# Patient Record
Sex: Male | Born: 1935 | Race: White | Hispanic: No | Marital: Married | Smoking: Current every day smoker
Health system: Southern US, Community
[De-identification: ages and names within clinical notes are randomized; demographics above are authoritative.]

## PROBLEM LIST (undated history)

## (undated) DIAGNOSIS — I5022 Chronic systolic (congestive) heart failure: Secondary | ICD-10-CM

## (undated) DIAGNOSIS — R7989 Other specified abnormal findings of blood chemistry: Secondary | ICD-10-CM

## (undated) DIAGNOSIS — N400 Enlarged prostate without lower urinary tract symptoms: Secondary | ICD-10-CM

## (undated) DIAGNOSIS — D649 Anemia, unspecified: Secondary | ICD-10-CM

## (undated) DIAGNOSIS — T7840XA Allergy, unspecified, initial encounter: Secondary | ICD-10-CM

## (undated) DIAGNOSIS — K5792 Diverticulitis of intestine, part unspecified, without perforation or abscess without bleeding: Secondary | ICD-10-CM

## (undated) DIAGNOSIS — K5790 Diverticulosis of intestine, part unspecified, without perforation or abscess without bleeding: Secondary | ICD-10-CM

## (undated) DIAGNOSIS — K219 Gastro-esophageal reflux disease without esophagitis: Secondary | ICD-10-CM

## (undated) DIAGNOSIS — M47816 Spondylosis without myelopathy or radiculopathy, lumbar region: Secondary | ICD-10-CM

## (undated) DIAGNOSIS — F419 Anxiety disorder, unspecified: Secondary | ICD-10-CM

## (undated) DIAGNOSIS — I4891 Unspecified atrial fibrillation: Secondary | ICD-10-CM

## (undated) DIAGNOSIS — K7460 Unspecified cirrhosis of liver: Secondary | ICD-10-CM

## (undated) DIAGNOSIS — M1990 Unspecified osteoarthritis, unspecified site: Secondary | ICD-10-CM

## (undated) DIAGNOSIS — K559 Vascular disorder of intestine, unspecified: Secondary | ICD-10-CM

## (undated) DIAGNOSIS — R001 Bradycardia, unspecified: Secondary | ICD-10-CM

## (undated) DIAGNOSIS — B029 Zoster without complications: Secondary | ICD-10-CM

## (undated) DIAGNOSIS — K859 Acute pancreatitis without necrosis or infection, unspecified: Secondary | ICD-10-CM

## (undated) DIAGNOSIS — I255 Ischemic cardiomyopathy: Secondary | ICD-10-CM

## (undated) DIAGNOSIS — E785 Hyperlipidemia, unspecified: Secondary | ICD-10-CM

## (undated) DIAGNOSIS — IMO0002 Reserved for concepts with insufficient information to code with codable children: Secondary | ICD-10-CM

## (undated) DIAGNOSIS — K838 Other specified diseases of biliary tract: Secondary | ICD-10-CM

## (undated) DIAGNOSIS — F329 Major depressive disorder, single episode, unspecified: Secondary | ICD-10-CM

## (undated) DIAGNOSIS — R945 Abnormal results of liver function studies: Secondary | ICD-10-CM

## (undated) DIAGNOSIS — I2510 Atherosclerotic heart disease of native coronary artery without angina pectoris: Secondary | ICD-10-CM

## (undated) DIAGNOSIS — I219 Acute myocardial infarction, unspecified: Secondary | ICD-10-CM

## (undated) DIAGNOSIS — I10 Essential (primary) hypertension: Secondary | ICD-10-CM

## (undated) DIAGNOSIS — F32A Depression, unspecified: Secondary | ICD-10-CM

## (undated) DIAGNOSIS — E875 Hyperkalemia: Secondary | ICD-10-CM

## (undated) DIAGNOSIS — I8510 Secondary esophageal varices without bleeding: Secondary | ICD-10-CM

## (undated) HISTORY — DX: Other specified diseases of biliary tract: K83.8

## (undated) HISTORY — DX: Chronic systolic (congestive) heart failure: I50.22

## (undated) HISTORY — PX: SHOULDER OPEN ROTATOR CUFF REPAIR: SHX2407

## (undated) HISTORY — PX: CATARACT EXTRACTION W/ INTRAOCULAR LENS IMPLANT: SHX1309

## (undated) HISTORY — DX: Benign prostatic hyperplasia without lower urinary tract symptoms: N40.0

## (undated) HISTORY — DX: Hyperkalemia: E87.5

## (undated) HISTORY — DX: Other specified abnormal findings of blood chemistry: R79.89

## (undated) HISTORY — PX: ESOPHAGOGASTRODUODENOSCOPY: SHX1529

## (undated) HISTORY — PX: CORONARY ANGIOPLASTY WITH STENT PLACEMENT: SHX49

## (undated) HISTORY — DX: Atherosclerotic heart disease of native coronary artery without angina pectoris: I25.10

## (undated) HISTORY — DX: Unspecified cirrhosis of liver: K74.60

## (undated) HISTORY — PX: FLEXIBLE SIGMOIDOSCOPY: SHX1649

## (undated) HISTORY — DX: Unspecified osteoarthritis, unspecified site: M19.90

## (undated) HISTORY — DX: Vascular disorder of intestine, unspecified: K55.9

## (undated) HISTORY — PX: LUMBAR DISC SURGERY: SHX700

## (undated) HISTORY — DX: Zoster without complications: B02.9

## (undated) HISTORY — DX: Bradycardia, unspecified: R00.1

## (undated) HISTORY — DX: Essential (primary) hypertension: I10

## (undated) HISTORY — DX: Ischemic cardiomyopathy: I25.5

## (undated) HISTORY — DX: Allergy, unspecified, initial encounter: T78.40XA

## (undated) HISTORY — DX: Abnormal results of liver function studies: R94.5

## (undated) HISTORY — DX: Diverticulitis of intestine, part unspecified, without perforation or abscess without bleeding: K57.92

## (undated) HISTORY — DX: Diverticulosis of intestine, part unspecified, without perforation or abscess without bleeding: K57.90

## (undated) HISTORY — DX: Anemia, unspecified: D64.9

## (undated) HISTORY — DX: Reserved for concepts with insufficient information to code with codable children: IMO0002

## (undated) HISTORY — DX: Hyperlipidemia, unspecified: E78.5

## (undated) HISTORY — DX: Unspecified atrial fibrillation: I48.91

## (undated) HISTORY — PX: COLONOSCOPY: SHX174

## (undated) HISTORY — DX: Spondylosis without myelopathy or radiculopathy, lumbar region: M47.816

## (undated) HISTORY — PX: BACK SURGERY: SHX140

## (undated) HISTORY — PX: COLON RESECTION: SHX5231

## (undated) HISTORY — DX: Gastro-esophageal reflux disease without esophagitis: K21.9

## (undated) HISTORY — PX: TONSILLECTOMY: SUR1361

## (undated) HISTORY — DX: Acute myocardial infarction, unspecified: I21.9

## (undated) HISTORY — DX: Acute pancreatitis without necrosis or infection, unspecified: K85.90

---

## 1997-11-06 HISTORY — PX: ANGIOPLASTY: SHX39

## 1998-04-27 ENCOUNTER — Other Ambulatory Visit: Admission: RE | Admit: 1998-04-27 | Discharge: 1998-04-27 | Payer: Self-pay | Admitting: Cardiology

## 2000-07-27 ENCOUNTER — Inpatient Hospital Stay (HOSPITAL_COMMUNITY): Admission: RE | Admit: 2000-07-27 | Discharge: 2000-07-28 | Payer: Self-pay | Admitting: Specialist

## 2002-03-18 ENCOUNTER — Ambulatory Visit (HOSPITAL_COMMUNITY): Admission: RE | Admit: 2002-03-18 | Discharge: 2002-03-18 | Payer: Self-pay | Admitting: Cardiology

## 2003-02-04 ENCOUNTER — Encounter: Payer: Self-pay | Admitting: Specialist

## 2003-02-12 ENCOUNTER — Observation Stay (HOSPITAL_COMMUNITY): Admission: RE | Admit: 2003-02-12 | Discharge: 2003-02-13 | Payer: Self-pay | Admitting: Specialist

## 2003-11-07 HISTORY — PX: CORONARY ARTERY BYPASS GRAFT: SHX141

## 2003-12-02 ENCOUNTER — Encounter: Admission: RE | Admit: 2003-12-02 | Discharge: 2003-12-02 | Payer: Self-pay | Admitting: Cardiology

## 2003-12-03 ENCOUNTER — Encounter: Admission: RE | Admit: 2003-12-03 | Discharge: 2003-12-03 | Payer: Self-pay | Admitting: Cardiology

## 2003-12-28 ENCOUNTER — Ambulatory Visit (HOSPITAL_COMMUNITY): Admission: RE | Admit: 2003-12-28 | Discharge: 2003-12-29 | Payer: Self-pay | Admitting: Cardiology

## 2004-01-04 ENCOUNTER — Ambulatory Visit (HOSPITAL_COMMUNITY): Admission: RE | Admit: 2004-01-04 | Discharge: 2004-01-04 | Payer: Self-pay | Admitting: Surgery

## 2004-01-11 ENCOUNTER — Inpatient Hospital Stay (HOSPITAL_COMMUNITY): Admission: RE | Admit: 2004-01-11 | Discharge: 2004-01-15 | Payer: Self-pay | Admitting: Surgery

## 2004-02-22 ENCOUNTER — Encounter: Admission: RE | Admit: 2004-02-22 | Discharge: 2004-05-22 | Payer: Self-pay | Admitting: Cardiology

## 2004-05-23 ENCOUNTER — Encounter (HOSPITAL_COMMUNITY): Admission: RE | Admit: 2004-05-23 | Discharge: 2004-08-21 | Payer: Self-pay | Admitting: Cardiology

## 2004-08-10 ENCOUNTER — Inpatient Hospital Stay (HOSPITAL_COMMUNITY): Admission: EM | Admit: 2004-08-10 | Discharge: 2004-08-12 | Payer: Self-pay | Admitting: *Deleted

## 2004-09-07 ENCOUNTER — Ambulatory Visit: Payer: Self-pay | Admitting: Internal Medicine

## 2004-09-18 ENCOUNTER — Emergency Department (HOSPITAL_COMMUNITY): Admission: EM | Admit: 2004-09-18 | Discharge: 2004-09-18 | Payer: Self-pay | Admitting: Family Medicine

## 2004-10-20 ENCOUNTER — Ambulatory Visit: Payer: Self-pay | Admitting: Internal Medicine

## 2004-10-26 ENCOUNTER — Ambulatory Visit (HOSPITAL_COMMUNITY): Admission: RE | Admit: 2004-10-26 | Discharge: 2004-10-26 | Payer: Self-pay | Admitting: Internal Medicine

## 2004-11-02 ENCOUNTER — Ambulatory Visit: Payer: Self-pay | Admitting: Internal Medicine

## 2004-11-09 ENCOUNTER — Ambulatory Visit: Payer: Self-pay | Admitting: Internal Medicine

## 2005-01-24 ENCOUNTER — Encounter (INDEPENDENT_AMBULATORY_CARE_PROVIDER_SITE_OTHER): Payer: Self-pay | Admitting: *Deleted

## 2005-01-24 ENCOUNTER — Inpatient Hospital Stay (HOSPITAL_COMMUNITY): Admission: RE | Admit: 2005-01-24 | Discharge: 2005-01-30 | Payer: Self-pay | Admitting: Surgery

## 2006-10-17 ENCOUNTER — Encounter: Admission: RE | Admit: 2006-10-17 | Discharge: 2006-10-17 | Payer: Self-pay | Admitting: Specialist

## 2007-10-17 ENCOUNTER — Ambulatory Visit (HOSPITAL_COMMUNITY): Admission: RE | Admit: 2007-10-17 | Discharge: 2007-10-18 | Payer: Self-pay | Admitting: Specialist

## 2009-10-04 ENCOUNTER — Encounter: Payer: Self-pay | Admitting: Internal Medicine

## 2009-10-08 ENCOUNTER — Encounter (INDEPENDENT_AMBULATORY_CARE_PROVIDER_SITE_OTHER): Payer: Self-pay | Admitting: *Deleted

## 2009-10-08 ENCOUNTER — Encounter: Admission: RE | Admit: 2009-10-08 | Discharge: 2009-10-08 | Payer: Self-pay | Admitting: Internal Medicine

## 2009-10-14 ENCOUNTER — Encounter (INDEPENDENT_AMBULATORY_CARE_PROVIDER_SITE_OTHER): Payer: Self-pay | Admitting: *Deleted

## 2009-10-25 ENCOUNTER — Encounter: Payer: Self-pay | Admitting: Internal Medicine

## 2009-11-13 HISTORY — PX: ERCP: SHX60

## 2009-11-17 ENCOUNTER — Ambulatory Visit: Payer: Self-pay | Admitting: Internal Medicine

## 2009-11-17 DIAGNOSIS — R932 Abnormal findings on diagnostic imaging of liver and biliary tract: Secondary | ICD-10-CM

## 2009-11-17 LAB — CONVERTED CEMR LAB
AST: 149 units/L — ABNORMAL HIGH (ref 0–37)
Alkaline Phosphatase: 152 units/L — ABNORMAL HIGH (ref 39–117)
Basophils Absolute: 0.1 10*3/uL (ref 0.0–0.1)
Eosinophils Absolute: 0.2 10*3/uL (ref 0.0–0.7)
Lymphocytes Relative: 21.1 % (ref 12.0–46.0)
MCHC: 33 g/dL (ref 30.0–36.0)
Monocytes Relative: 8.3 % (ref 3.0–12.0)
Platelets: 134 10*3/uL — ABNORMAL LOW (ref 150.0–400.0)
RDW: 13.6 % (ref 11.5–14.6)
Total Bilirubin: 0.8 mg/dL (ref 0.3–1.2)

## 2009-11-23 ENCOUNTER — Encounter: Payer: Self-pay | Admitting: Internal Medicine

## 2009-11-23 ENCOUNTER — Ambulatory Visit (HOSPITAL_COMMUNITY): Admission: RE | Admit: 2009-11-23 | Discharge: 2009-11-23 | Payer: Self-pay | Admitting: Internal Medicine

## 2009-11-23 ENCOUNTER — Telehealth: Payer: Self-pay | Admitting: Gastroenterology

## 2009-11-23 ENCOUNTER — Inpatient Hospital Stay (HOSPITAL_COMMUNITY): Admission: EM | Admit: 2009-11-23 | Discharge: 2009-11-29 | Payer: Self-pay | Admitting: Emergency Medicine

## 2009-11-24 ENCOUNTER — Ambulatory Visit: Payer: Self-pay | Admitting: Gastroenterology

## 2009-11-26 ENCOUNTER — Encounter: Payer: Self-pay | Admitting: Gastroenterology

## 2009-11-27 ENCOUNTER — Encounter: Payer: Self-pay | Admitting: Gastroenterology

## 2009-11-30 ENCOUNTER — Telehealth (INDEPENDENT_AMBULATORY_CARE_PROVIDER_SITE_OTHER): Payer: Self-pay

## 2009-12-01 ENCOUNTER — Encounter: Payer: Self-pay | Admitting: Internal Medicine

## 2009-12-01 ENCOUNTER — Telehealth (INDEPENDENT_AMBULATORY_CARE_PROVIDER_SITE_OTHER): Payer: Self-pay

## 2009-12-06 ENCOUNTER — Ambulatory Visit: Payer: Self-pay | Admitting: Internal Medicine

## 2009-12-07 ENCOUNTER — Ambulatory Visit (HOSPITAL_COMMUNITY): Admission: RE | Admit: 2009-12-07 | Discharge: 2009-12-07 | Payer: Self-pay | Admitting: Internal Medicine

## 2009-12-07 ENCOUNTER — Encounter: Payer: Self-pay | Admitting: Internal Medicine

## 2010-01-21 ENCOUNTER — Telehealth: Payer: Self-pay | Admitting: Internal Medicine

## 2010-02-03 ENCOUNTER — Telehealth: Payer: Self-pay | Admitting: Internal Medicine

## 2010-02-04 ENCOUNTER — Ambulatory Visit: Payer: Self-pay | Admitting: Internal Medicine

## 2010-02-07 LAB — CONVERTED CEMR LAB
ALT: 91 units/L — ABNORMAL HIGH (ref 0–53)
AST: 117 units/L — ABNORMAL HIGH (ref 0–37)
Albumin: 3.7 g/dL (ref 3.5–5.2)
Alkaline Phosphatase: 131 units/L — ABNORMAL HIGH (ref 39–117)
Total Bilirubin: 0.6 mg/dL (ref 0.3–1.2)

## 2010-02-24 ENCOUNTER — Ambulatory Visit: Payer: Self-pay | Admitting: Internal Medicine

## 2010-02-24 DIAGNOSIS — K859 Acute pancreatitis without necrosis or infection, unspecified: Secondary | ICD-10-CM | POA: Insufficient documentation

## 2010-06-03 ENCOUNTER — Ambulatory Visit: Payer: Self-pay | Admitting: Internal Medicine

## 2010-06-20 ENCOUNTER — Ambulatory Visit: Payer: Self-pay | Admitting: *Deleted

## 2010-08-10 LAB — CONVERTED CEMR LAB
ALT: 71 units/L — ABNORMAL HIGH (ref 0–53)
AST: 56 units/L — ABNORMAL HIGH (ref 0–37)
Albumin: 4 g/dL (ref 3.5–5.2)
Total Bilirubin: 0.6 mg/dL (ref 0.3–1.2)

## 2010-08-18 ENCOUNTER — Ambulatory Visit: Payer: Self-pay | Admitting: Internal Medicine

## 2010-08-18 LAB — CONVERTED CEMR LAB
ALT: 66 units/L — ABNORMAL HIGH (ref 0–53)
Bilirubin, Direct: 0.1 mg/dL (ref 0.0–0.3)
Total Bilirubin: 0.6 mg/dL (ref 0.3–1.2)

## 2010-08-19 ENCOUNTER — Telehealth: Payer: Self-pay | Admitting: Internal Medicine

## 2010-09-23 ENCOUNTER — Ambulatory Visit: Payer: Self-pay | Admitting: Internal Medicine

## 2010-09-23 DIAGNOSIS — R109 Unspecified abdominal pain: Secondary | ICD-10-CM

## 2010-09-25 LAB — CONVERTED CEMR LAB
Albumin: 4 g/dL (ref 3.5–5.2)
Alkaline Phosphatase: 112 units/L (ref 39–117)
BUN: 11 mg/dL (ref 6–23)
CO2: 29 meq/L (ref 19–32)
Calcium: 9.2 mg/dL (ref 8.4–10.5)
GFR calc non Af Amer: 97.13 mL/min (ref >60)
Glucose, Bld: 80 mg/dL (ref 70–99)
Potassium: 4.7 meq/L (ref 3.5–5.1)
Sodium: 141 meq/L (ref 135–145)
Total Protein: 7 g/dL (ref 6.0–8.3)

## 2010-09-28 ENCOUNTER — Ambulatory Visit (HOSPITAL_COMMUNITY)
Admission: RE | Admit: 2010-09-28 | Discharge: 2010-09-28 | Payer: Self-pay | Source: Home / Self Care | Admitting: Internal Medicine

## 2010-10-06 ENCOUNTER — Telehealth: Payer: Self-pay | Admitting: Internal Medicine

## 2010-10-18 ENCOUNTER — Encounter: Payer: Self-pay | Admitting: Internal Medicine

## 2010-10-18 ENCOUNTER — Ambulatory Visit: Payer: Self-pay | Admitting: Cardiology

## 2010-12-06 NOTE — Procedures (Signed)
Summary: Colonoscopy: Diverticulosis   Colonoscopy  Procedure date:  09/07/2004  Findings:      Results: Diverticulosis.       Location:  [facility].   Patient Name: Kirk [name], Kirk [name] MRN:  Procedure Procedures: Colonoscopy CPT: [phone].  Personnel: Endoscopist: Iva Boop, MD, [facility].  Referred By: Pearletha Furl Jacky Kindle, MD.  Exam Location: Exam performed in Outpatient Clinic. Outpatient  Patient Consent: Procedure, Alternatives, Risks and Benefits discussed, consent obtained, from patient. Consent was obtained by the RN.  Indications Symptoms: Hematochezia. Change in bowel habits.  Comments: Recent ischemic colitis (clinically resolved). History  Current Medications: Patient is not currently taking Coumadin.  Pre-Exam Physical: Performed Sep 07, 2004. Cardio-pulmonary exam, Rectal exam, HEENT exam , Abdominal exam, Mental status exam WNL.  Exam Exam: Extent of exam reached: Cecum, extent intended: Cecum.  The cecum was identified by appendiceal orifice and IC valve. Colon retroflexion performed. Images taken. ASA Classification: II. Tolerance: good.  Monitoring: Pulse and BP monitoring, Oximetry used. Supplemental O2 given.  Colon Prep Used MiraLax for colon prep. Prep results: good.  Sedation Meds: Patient assessed and found to be appropriate for moderate (conscious) sedation. Fentanyl 75 mcg. given IV. Versed 6 mg. given IV.  Findings - DIVERTICULOSIS: Descending Colon to Sigmoid Colon. ICD9: Diverticulosis, Colon: 562.10. Comments: Stenotic, edematous distal sigmoid. No inflammation seen.  - NORMAL EXAM: Cecum to Descending Colon.  NORMAL EXAM: Rectum.   Assessment Abnormal examination, see findings above.  Diagnoses: 562.10: Diverticulosis, Colon.   Comments: Severe diverticulosis. I think he is having diverticular pain, though could still be recovering from ischemia. No divertriculitis seen. Events  Unplanned Interventions: No  intervention was required.  Plans Comments: Still using some narcotics. Will try Levbid. Disposition: After procedure patient sent to recovery. After recovery patient sent home.  Scheduling/Referral: Clinic Visit, to Iva Boop, MD, [facility], 6 weeks,   Comments: If symptoms worsen would check CT.   CC:   Geoffry Paradise, MD  This report was created from the original endoscopy report, which was reviewed and signed by the above listed endoscopist.     Appended Document: Colonoscopy: Diverticulosis  Colonoscopy  Procedure date:  09/07/2004  Findings:      Results: Diverticulosis. Location:  [facility].   Comments: Severe diverticulosis. I think he is having diverticular pain, though could still be recovering from ischemia. No divertriculitis seen.

## 2010-12-06 NOTE — Procedures (Signed)
Summary: ERCP  Patient: Kendle Erker Note: All result statuses are Final unless otherwise noted.  Tests: (1) ERCP (ERC)   ERC ERCP                  DONE     [facility]     [address] [place], [place]  [address]           ERCP PROCEDURE REPORT           PATIENT:  Kirk [name], Kirk [name]  MR#:  [id number]     BIRTHDATE:  [birth year]  GENDER:  male           ENDOSCOPIST:  Barbette Hair. Arlyce Dice, MD     ASSISTANT:           PROCEDURE DATE:  11/27/2009     PROCEDURE:  ERCP with stent placement           INDICATIONS:  cholangitis           MEDICATIONS:   MAC sedation, administered by CRNA     TOPICAL ANESTHETIC:           DESCRIPTION OF PROCEDURE:   After the risks benefits and     alternatives of the procedure were thoroughly explained, informed     consent was obtained.  The  endoscope was introduced through the     mouth and advanced to the third portion of the duodenum.           other finding (see image1). Very prominent (bulging) ampulla with     normal appearing mucosa  The pancreatic duct was successfully     cannulated and filled with contrast. Care was taken not to     overfill the duct. Normal appearing pancreatic duct. Because of     recent h/o p-ERCP pancreatitis, a 5Fr 4cm pancreatic stent was     placed.  incomplete exam. CBD in the perampullary area was     patulous. Unable to pass a wire or catheter into more proximal CBD     for successful cannulation.    The scope was then completely     withdrawn from the patient and the procedure terminated.     <<PROCEDUREIMAGES>>           COMPLICATIONS:  None           ENDOSCOPIC IMPRESSION:     1) Other finding - prominent ampulla     2) Normal  pancreatic duct - s/p stent insertion b/c of h/o     pancreatitis     3) Incomplete exam - unsuccessful cannulation of CBD beyond     perampullary area           Pt has jaundice and probable low grade cholangitis.  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 Arlyce Dice, MD           CC:  Iva Boop, M.D., [facility]           n.     eSIGNED:   Barbette Hair. Oneta Sigman at 11/27/2009 01:04 PM  Ruperto, Kiernan, [phone]  Note: An exclamation mark (!) indicates a result that was not dispersed into the flowsheet. Document Creation Date: 11/27/2009 1:05 PM _______________________________________________________________________  (1) Order result status: Final Collection or observation date-time: 11/27/2009 12:09 Requested date-time:  Receipt date-time:  Reported date-time:  Referring Physician:   Ordering Physician: Melvia Heaps [phone]) Specimen Source:  Source: Launa Grill Order Number: [id number] Lab site:   Appended Document: ERCP Pt. has a f/u appt. with Dr.Gessner on 12-06-09. Follow-up will be discussed/scheduled at that time.  Appended Document: ERCP   ERCP  Procedure date:  11/27/2009  Findings:      abnormal: Location: [facility] ENDOSCOPIC IMPRESSION:     1) Other finding - prominent ampulla     2) Normal  pancreatic duct - s/p stent insertion b/c of h/o     pancreatitis     3) Incomplete exam - unsuccessful cannulation of CBD beyond     perampullary area

## 2010-12-06 NOTE — Progress Notes (Signed)
Summary: MRCP results explained, 4 month follow-up planned  Phone Note Outgoing Call   Summary of Call: I explained MRCP results: no mass or tumor seen bile duct still dilated, pancreatic duct also - stable he did have another attack of abdominal pain and it was relieved by hyoscyamine. He understands there could be a malignant or benign problem with ampulla but we have decided to monitor and see how he does as not really a candidate for a surgery and lack of mass over 1 year is a good sign. If persistent pain issues would pursue EUS, I think. Iva Boop MD, [facility]  October 06, 2010 6:27 PM

## 2010-12-06 NOTE — Progress Notes (Signed)
  Phone Note Call from Patient   Caller: Spouse Summary of Call: has had nausea, abd pain, chills since ERCP this afternoon (reviewed ERCP note, PD never cannulated, no sphincterotomy).  I'm having him go to [facility] ER, will meet him there after getting basic labs done (pancreatic enzyms, cbc, cmet) Initial call taken by: Rachael Fee MD,  November 23, 2009 6:02 PM

## 2010-12-06 NOTE — Letter (Signed)
Summary: [facility]   Imported By: Sherian Rein 11/22/2009 09:47:28  _____________________________________________________________________  External Attachment:    Type:   Image     Comment:   External Document

## 2010-12-06 NOTE — Letter (Signed)
Summary: [facility]   Imported By: Sherian Rein 12/22/2009 07:44:57  _____________________________________________________________________  External Attachment:    Type:   Image     Comment:   External Document

## 2010-12-06 NOTE — Assessment & Plan Note (Signed)
Summary: post hospital/sheri   History of Present Illness Visit Type: Follow-up Visit Primary GI MD: Stan Head MD [facility] Primary Provider: Geoffry Paradise, MD Requesting Provider: Geoffry Paradise, MD Chief Complaint: dilated bile duct and abnormal LFT's History of Present Illness:   77 yo white man with dilated bile duct and abnormal LFT's. He had this evaluated with ERCP and it was unrevealing, though he had atypivcal cells on biliary brushings.No masses on CT. He developed post-ERCP pancreatitis and was hospitalized, then developed CHF problems also. His main issue now is chronic recurrent back pain. there had been some subsequent ? of abdominal pain and chills and ? of cholangitis. Another ERCP was attempted by Dr. Arlyce Dice but was unsuccessful. He denies any abdominal pain and says he has not really been having any. He appears to be recovered fro the pancreatitis.   GI Review of Systems      Denies abdominal pain, acid reflux, belching, bloating, chest pain, dysphagia with liquids, dysphagia with solids, heartburn, loss of appetite, nausea, vomiting, vomiting blood, weight loss, and  weight gain.        Denies anal fissure, black tarry stools, change in bowel habit, constipation, diarrhea, diverticulosis, fecal incontinence, heme positive stool, hemorrhoids, irritable bowel syndrome, jaundice, light color stool, liver problems, rectal bleeding, and  rectal pain.    Current Medications (verified): 1)  Diclofenac Sodium Cr 100 Mg Xr24h-Tab (Diclofenac Sodium) .... As Needed 2)  Finasteride 5 Mg Tabs (Finasteride) .... Take 1 Tablet By Mouth Once A Day 3)  Hydrocodone-Acetaminophen 5-500 Mg Tabs (Hydrocodone-Acetaminophen) .... As Needed For Pain 4)  Lisinopril 10 Mg Tabs (Lisinopril) .... Take 1 Tablet By Mouth Once A Day 5)  Protonix 40 Mg  Tbec (Pantoprazole Sodium) .Marland Kitchen.. 1 Each Day 30 Minutes Before Meal As Needed 6)  Aspirin 81 Mg Tbec (Aspirin) .... Take 1 Tablet By Mouth Once A  Day 7)  Claritin 10 Mg Tabs (Loratadine) .... As Needed  Allergies (verified): 1)  ! * Mycins  Past History:  Past Medical History: Myocardial Infarction 1999 Hyperlipidemia GERD Coronary Artery Disease Hypertension Arthritis Diverticulitis Diverticulosis dilated bile duct, uncklear cause post-ERCP pancreatitis  Past Surgical History: Colon Resection Back Surgery CABGx4 Angioplasty 1999 Rotator Cuff Repair Cataract Extraction  Family History: Reviewed history from 11/17/2009 and no changes required. Family History of Lung Cancer: Father Family History of Diabetes: Mother Family History of Heart Disease: Mother, CHF No FH of Colon Cancer:  Social History: Reviewed history from 11/17/2009 and no changes required. Alcohol Use - yes Occupation: Retired Patient currently smokes. Cigars Daily Caffeine Use Illicit Drug Use - no  Review of Systems       The patient complains of allergy/sinus, arthritis/joint pain, back pain, cough, itching, sleeping problems, and urination - excessive.  The patient denies anemia, anxiety-new, blood in urine, breast changes/lumps, change in vision, confusion, coughing up blood, depression-new, fainting, fatigue, fever, headaches-new, hearing problems, heart murmur, heart rhythm changes, menstrual pain, muscle pains/cramps, night sweats, nosebleeds, pregnancy symptoms, shortness of breath, skin rash, sore throat, swelling of feet/legs, swollen lymph glands, thirst - excessive , urination - excessive , urination changes/pain, urine leakage, vision changes, and voice change.    Vital Signs:  Patient profile:   77 year old male Height:      66 inches Weight:      140.25 pounds BMI:     22.72 Pulse rate:   68 / minute Pulse rhythm:   regular BP sitting:   160 / 80  (left  arm) Cuff size:   regular  Vitals Entered By: June McMurray CMA Duncan Dull) (February 24, 2010 2:47 PM)  Physical Exam  General:  Well developed, well nourished, no acute  distress.   Impression & Recommendations:  Problem # 1:  ALKALINE PHOSPHATASE, ELEVATED (ICD-790.5) Assessment Improved presumably related to biliary dilation ? component of CHF/passive congestion  Problem # 2:  TRANSAMINASES, SERUM, ELEVATED (ICD-790.4) Assessment: Improved presumably related to biliary dilation ? component of CHF/passive congestion  Problem # 3:  ABNORMAL EXAM-BILIARY TRACT (ICD-793.3) Assessment: Unchanged Cause of dilated bile duct is unclear. Though there were atypical cells on biliary brushings he had no masses or strictures. i do not think that he has had cholangitis, I think his LFT's increased after ERCP due to pancreatitis and edema worsening biliary obstruction. this has improved and LFT's nearly normal, CHF may have also contributed to jaundice and abnormal LFT's. at this point he understands we are not sure of cause of this but balance of data indicated its benign and he seems to be asymptomatic. Plan to o bserve and follow-up in a few months. Consider repeat imaging (M, EUS)) then.  Problem # 4:  Hx of PANCREATITIS, ACUTE (ICD-577.0) Assessment: Improved post-ercp pancreatitis appears resolved.  Patient Instructions: 1)  Please schedule a follow-up appointment in 4 months.  2)  Please schedule a follow-up appointment as needed. Sooner if you get pains or develop jaundice (yellow eyes, skin). 3)  Copy sent to : Geoffry Paradise, MD, Roger Shelter, MD 4)  The medication list was reviewed and reconciled.  All changed / newly prescribed medications were explained.  A complete medication list was provided to the patient / caregiver. Patient: Kirk [name] Note: All result statuses are Final unless otherwise noted.  Tests: (1) Hepatic/Liver Function Panel (HEPATIC)   Total Bilirubin           0.6 mg/dL                   5.1-8.8   Direct Bilirubin          0.2 mg/dL                   4.1-6.6   Alkaline Phosphatase [H]  131 U/L                     39-117    AST                  [H]  117 U/L                     0-37   ALT                  [H]  91 U/L                      0-53   Total Protein             6.8 g/dL                    0.6-3.0   Albumin                   3.7 g/dL                    1.6-0.1  Note: An exclamation mark (!) indicates a result that was not dispersed into the flowsheet. Document Creation Date: 02/04/2010 10:52 AM

## 2010-12-06 NOTE — Progress Notes (Signed)
  Phone Note Outgoing Call   Summary of Call: notify patient liver chemistries stable I would like to see him in follow-up to discuss possible repeat imaging Iva Boop MD, [facility]  August 19, 2010 8:30 AM      Appended Document:  pt's wife notified labs are stable and of need for REV.  She will relay this message to pt and leave it up to him to decide if he wants to come in for visit and repeat imaging.

## 2010-12-06 NOTE — Progress Notes (Signed)
Summary: Reschedule KUB  ---- Converted from flag ---- ---- 12/01/2009 10:38 AM, Iva Boop MD, [facility] wrote: I would do it on Friday and he does not need to be NPO (give it a little longer to pass)  ---- 12/01/2009 9:54 AM, Darcey Nora RN, CGRN wrote: Amy  had me set this up.  He has a REV with you on MOnday.  Let me know when I need to reschedule   Ascher Schroepfer ---- 12/01/2009 9:19 AM, Iva Boop MD, [facility] wrote: May want to change date of KUB talk to me ------------------------------  Phone Note Outgoing Call Call back at [facility] Phone [phone]   Call placed by: Darcey Nora RN, CGRN,  December 01, 2009 10:45 AM Call placed to: Patient Summary of Call: Patient  rescheduled to Friday 12-03-09 8:30 Initial call taken by: Darcey Nora RN, CGRN,  December 01, 2009 10:46 AM

## 2010-12-06 NOTE — Progress Notes (Signed)
Summary: follow-up  Phone Note Outgoing Call   Summary of Call: Please call Kirk [name] He needs repeat LFT's and then will arrange for a follow-up REV ask ihim about any GI sxs and let me know Iva Boop MD, [facility]  January 21, 2010 9:28 AM   Follow-up for Phone Call        Left message for patient to call back Kirk Nora RN, [facility]  January 21, 2010 10:02 AM  Patient states he has occasional lower abdominal pain that will last for a few hours and relieved by rest.  Otherwise he states he is doing well. REV scheduled for 02-24-10 , he will come next week for labs.    Follow-up by: Kirk Nora RN, CGRN,  January 21, 2010 12:06 PM

## 2010-12-06 NOTE — Assessment & Plan Note (Signed)
Summary: 6 month follow up pancreatitis/sheri   History of Present Illness Visit Type: Follow-up Visit Primary GI MD: Stan Head MD [facility] Primary Provider: Geoffry Paradise, MD Requesting Provider: na Chief Complaint: pancreatitis History of Present Illness:   77 yo wm with  dilated bile ducts and abnormal LFT's. ERCP was inconclusive and he developed post-ERCP pancreatitis in early 2011. He recovered from that.  No complaints today. He had cataract surgery on left since last visit. No other doctor visits since last visit here.  He recently had his drivers license including CDL's renewed.    GI Review of Systems      Denies abdominal pain, acid reflux, belching, bloating, chest pain, dysphagia with liquids, dysphagia with solids, heartburn, loss of appetite, nausea, vomiting, vomiting blood, weight loss, and  weight gain.        Denies anal fissure, black tarry stools, change in bowel habit, constipation, diarrhea, diverticulosis, fecal incontinence, heme positive stool, hemorrhoids, irritable bowel syndrome, jaundice, light color stool, liver problems, rectal bleeding, and  rectal pain. Clinical Reports Reviewed:  ERCP:  11/27/2009:  DONE  11/27/2009:  abnormal: Location: [facility]. ENDOSCOPIC IMPRESSION:     1) Other finding - prominent ampulla     2) Normal  pancreatic duct - s/p stent insertion b/c of h/o     pancreatitis     3) Incomplete exam - unsuccessful cannulation of CBD beyond     perampullary area  11/23/2009:  DONE  11/23/2009:  abnormal: Location: [facility]. ENDOSCOPIC IMPRESSION:     1) Dilatation in the total biliary tree of uncclear     significance. No obstruction identified.  ***Microscopic Examination and Diagnosis*** STATEMENT Of SPECIMEN ADEQUACY: Satisfactory for evaluation. INTERPRETATION(S): BILE DUCT BRUSHING, AMPULA: Atypical cells PRESENT, see comment. Comment: There are a few small groups of atypical glandular  epithelium with prominent nucleoli suspicious for malignancy.     Current Medications (verified): 1)  Diclofenac Sodium Cr 100 Mg Xr24h-Tab (Diclofenac Sodium) .... As Needed 2)  Finasteride 5 Mg Tabs (Finasteride) .... Take 1 Tablet By Mouth Once A Day 3)  Hydrocodone-Acetaminophen 5-500 Mg Tabs (Hydrocodone-Acetaminophen) .... As Needed For Pain 4)  Lisinopril 10 Mg Tabs (Lisinopril) .... Take 1 Tablet By Mouth Once A Day 5)  Protonix 40 Mg  Tbec (Pantoprazole Sodium) .Marland Kitchen.. 1 Each Day 30 Minutes Before Meal As Needed 6)  Aspirin 81 Mg Tbec (Aspirin) .... Take 1 Tablet By Mouth Once A Day 7)  Claritin 10 Mg Tabs (Loratadine) .... As Needed  Allergies (verified): 1)  ! * Mycins  Past History:  Past Medical History: Myocardial Infarction 1999 Hyperlipidemia GERD Coronary Artery Disease Hypertension Arthritis Diverticulitis Diverticulosis dilated bile duct, unclear cause post-ERCP pancreatitis  Past Surgical History: Reviewed history from 02/24/2010 and no changes required. Colon Resection Back Surgery CABGx4 Angioplasty 1999 Rotator Cuff Repair Cataract Extraction  Family History: Reviewed history from 11/17/2009 and no changes required. Family History of Lung Cancer: Father Family History of Diabetes: Mother Family History of Heart Disease: Mother, CHF No FH of Colon Cancer:  Social History: Reviewed history from 11/17/2009 and no changes required. Alcohol Use - yes Occupation: Retired Patient currently smokes. Cigars Daily Caffeine Use Illicit Drug Use - no  Vital Signs:  Patient profile:   76 year old male Height:      66 inches Weight:      141 pounds BMI:     22.84 BSA:     1.73 Pulse rate:   74 / minute  Pulse rhythm:   regular BP sitting:   142 / 86  (left arm) Cuff size:   regular  Vitals Entered By: Ok Anis CMA (June 03, 2010 2:59 PM)  Physical Exam  General:  Well developed, well nourished, no acute distress.   Impression &  Recommendations:  Problem # 1:  ABNORMAL EXAM-BILIARY TRACT (ICD-793.3) Assessment Unchanged ERCP as inconclusive and in fact looked to be without stricture. Atypoical cells on brushings.   Given the lack of clear stricture and problems from the ERCP, as well as his lack of sxs, plan to observe and assess with labs at this time. At some point will repeat imaging in 2011 most likely. He understands could have a cancer developing but at his age, etc surgery has major risks and it may never be a real clinical issue for him.  Orders: TLB-Hepatic/Liver Function Pnl ([id number])  Problem # 2:  ALKALINE PHOSPHATASE, ELEVATED (ICD-790.5) Assessment: Unchanged  Problem # 3:  TRANSAMINASES, SERUM, ELEVATED (ICD-790.4) Assessment: Unchanged  Patient Instructions: 1)  Please go to the basement to have your lab tests drawn today.  2)  We will call you with further follow up after reviewing these results. 3)  Copy sent to : Geoffry Paradise, MD 4)  The medication list was reviewed and reconciled.  All changed / newly prescribed medications were explained.  A complete medication list was provided to the patient / caregiver. Patient: Kirk [name] Note: All result statuses are Final unless otherwise noted.  Tests: (1) Hepatic/Liver Function Panel (HEPATIC)   Total Bilirubin           0.6 mg/dL                   0.3-4.7   Direct Bilirubin          0.2 mg/dL                   4.2-5.9   Alkaline Phosphatase [H]  147 U/L                     39-117   AST                  [H]  56 U/L                      0-37   ALT                  [H]  71 U/L                      0-53   Total Protein             7.1 g/dL                    5.6-3.8   Albumin                   4.0 g/dL                    7.5-6.4  Note: An exclamation mark (!) indicates a result that was not dispersed into the flowsheet. Document Creation Date: 06/03/2010 5:06 PM  These are about the same as in April. Not certain from biliary  dilation.  Will have him undergo repeat labs and  imaging with EUS vs MRCP in the Fall.   We will call him back in October.

## 2010-12-06 NOTE — Progress Notes (Signed)
Summary: Schedule KUB  Phone Note Outgoing Call Call back at [facility] Phone [phone]   Call placed by: Darcey Nora RN, CGRN,  November 30, 2009 10:20 AM Call placed to: Patient Summary of Call: Called patient to schedule KUB to visualize location on pancreatic stent.  Patient  is scheduled for 12-02-09 8:30.  Patient  is asked to be NPO. Initial call taken by: Darcey Nora RN, CGRN,  November 30, 2009 10:24 AM

## 2010-12-06 NOTE — Assessment & Plan Note (Signed)
Summary: ELEVATED LTF'S/YF   History of Present Illness Visit Type: consult Primary GI MD: Stan Head MD [facility] Primary Provider: Geoffry Paradise, MD Requesting Provider: Geoffry Paradise, MD Chief Complaint: elevated LFTs History of Present Illness:   77 year old white man sent over to evaluate dilated bile ducts and abnormal transaminases and alkaline phosphatase. He had been doing well and presented for a routine physical exam. He does describe some bloating at times and some occasional constipation. There is no abdominal pain. His primary care physician found in the have an AST of 125 on November 30, and ALT of 146, and an alkaline phosphatase of 232. Total bilirubin 0.4. CT scanning was undertaken demonstrating mild intrahepatic biliary dilation to moderate, with progressive dilation of the common bile duct at 11 mm in the pancreatic head on one view and 1.4 cm in the porta hepatis and 1.3 cm and the pancreatic head on the coronal views. The scan has been reviewed with the patient. Images seen.   GI Review of Systems    Reports abdominal pain and  bloating.      Denies acid reflux, belching, chest pain, dysphagia with liquids, dysphagia with solids, heartburn, loss of appetite, nausea, vomiting, vomiting blood, weight loss, and  weight gain.      Reports constipation and  liver problems.     Denies anal fissure, black tarry stools, change in bowel habit, diarrhea, diverticulosis, fecal incontinence, heme positive stool, hemorrhoids, irritable bowel syndrome, jaundice, light color stool, rectal bleeding, and  rectal pain.    Current Medications (verified): 1)  Diclofenac Sodium Cr 100 Mg Xr24h-Tab (Diclofenac Sodium) .... As Needed 2)  Finasteride 5 Mg Tabs (Finasteride) .... Take 1 Tablet By Mouth Once A Day 3)  Hydrocodone-Acetaminophen 5-500 Mg Tabs (Hydrocodone-Acetaminophen) .... As Needed For Pain 4)  Lisinopril 10 Mg Tabs (Lisinopril) .... Take 1 Tablet By Mouth Once A Day 5)   Protonix 40 Mg  Tbec (Pantoprazole Sodium) .Marland Kitchen.. 1 Each Day 30 Minutes Before Meal As Needed 6)  Aspirin 81 Mg Tbec (Aspirin) .... Take 1 Tablet By Mouth Once A Day  Allergies (verified): 1)  ! * Mycins  Past History:  Past Medical History: Myocardial Infarction 1999 Hyperlipidemia GERD Coronary Artery Disease Hypertension Arthritis Diverticulitis Diverticulosis  Past Surgical History: Reviewed history from 11/16/2009 and no changes required. Colon Resection Back Surgery CABGx4 Angioplasty 1999 Rotator Cuff Repair  Family History: Family History of Lung Cancer: Father Family History of Diabetes: Mother Family History of Heart Disease: Mother, CHF No FH of Colon Cancer:  Social History: Alcohol Use - yes Occupation: Retired Patient currently smokes. Cigars Daily Caffeine Use Illicit Drug Use - no Smoking Status:  current Drug Use:  no  Review of Systems       The patient complains of back pain, fatigue, hearing problems, muscle pains/cramps, shortness of breath, sleeping problems, and urination - excessive.         All other ROS negative except as per HPI.   Vital Signs:  Patient profile:   77 year old male Height:      66 inches Weight:      149.50 pounds BMI:     24.22 Pulse rate:   64 / minute Pulse rhythm:   regular BP sitting:   150 / 68  (left arm)  Vitals Entered By: Milford Cage NCMA (November 17, 2009 1:53 PM)  Physical Exam  General:  Well developed, well nourished, no acute distress. Eyes:  PERRLA, no icterus. Mouth:  No deformity  or lesions, dentition normal. Neck:  Supple; no masses or thyromegaly. Lungs:  Clear throughout to auscultation. Heart:  Regular rate and rhythm; no murmurs, rubs,  or bruits. Abdomen:  prior surgical scares some increase in bowel sounds soft and nontender without HSM/mass reducible right inguinal hernia Extremities:  varicosities, hyperpigmentation, no edema in lower extremities Neurologic:  Alert and   oriented x4; Cervical Nodes:  No significant cervical or supraclavicular adenopathy.  Psych:  Alert and cooperative. Normal mood and affect.   Impression & Recommendations:  Problem # 1:  TRANSAMINASES, SERUM, ELEVATED (ICD-790.4) Assessment New presumably due to obstruction detected on CT scan. We'll recheck labs today to see course. Orders: TLB-Hepatic/Liver Function Pnl ([id number])  Problem # 2:  ALKALINE PHOSPHATASE, ELEVATED (ICD-790.5) Assessment: New presumably due to the obstruction on CT as above.  Problem # 3:  ABNORMAL EXAM-BILIARY TRACT (ICD-793.3) Assessment: New there is dilation of the intra-and extrahepatic biliary tree. Maximal dilation up to 1.4 cm on CT scanning. There is no pancreatic mass fortunately. There could be an obstruction at the level of the papilla includes an ampullary tumor, or perhaps papillary stenosis or even cholangiocarcinoma. Bile duct stone or stones remains possible but seems unlikely. We'll plan for ERCP to investigate this. Risks, benefits,and indications of endoscopic procedure(s) were reviewed with the patient and all questions answered.  Problem # 4:  THROMBOCYTOPENIA (ICD-287.5) Assessment: New This is mild, given that ERCP and possible sphincterotomy contemplated will recheck a platelet count with a CBC. Orders: TLB-CBC Platelet - w/Differential ([id number])  Patient Instructions: 1)  Please go to the basement to have your lab tests drawn today.  2)  We will call you with your lab results and schedule ERCP. 3)  ERCP brochure given.  4)  Copy sent to : Geoffry Paradise, MD 5)  The medication list was reviewed and reconciled.  All changed / newly prescribed medications were explained.  A complete medication list was provided to the patient / caregiver.  Appended Document: ELEVATED LTF'S/YF let him know labs about the same, slightly better. He needs the ERCP, ? if it can be done next Tues PM antibiotics not needed  pre-ERCP  Appended Document: ELEVATED LTF'S/YF Patient  is scheduled for ERCP at [facility] 11-23-09 1:30.  I have left a message for the patient to call back to discuss.  Appended Document: ELEVATED LTF'S/YF I have confirmed the appointment with the patient's wife.  She verbalized understanding of instructions.

## 2010-12-06 NOTE — Procedures (Signed)
Summary: Flex Sig: Diverticulosis   Flexible Sigmoidoscopy  Procedure date:  11/09/2004  Findings:      Results: Diverticulosis.   Comments:      Location: [facility].  Patient Name: Kirk [name], Kirk [name] MRN:  Procedure Procedures: Flexible Proctosigmoidoscopy CPT: [phone].  Personnel: Endoscopist: Iva Boop, MD, [facility].  Exam Location: Exam performed in Outpatient Clinic. Outpatient  Patient Consent: Procedure, Alternatives, Risks and Benefits discussed, consent obtained, from patient.  Indications Symptoms: Abdominal pain / bloating.  Abnormal Exams, Studies: CT scan,  History  Current Medications: Patient is not currently taking Coumadin.  Comments: PERSISTENT LLQ PAIN AFTER ISCHEMIC COLITIS COLONOSCOPY 09/07/04 WITH EDEMATOUS SIGMOID. CT SHOWS PERSISTENT NARROWING OF SIGMOID. ALSO SHOWS CALCIFICATION OF SMA AND IMA. Pre-Exam Physical: Performed Nov 09, 2004. Cardio-pulmonary exam  WNL. Rectal exam, HEENT exam , Mental status exam WNL.  Exam Exam: Extent visualized: Sigmoid Colon. Extent of exam: 40 cm. Patient position: on left side. Colon retroflexion not performed. Images taken. ASA Classification: III. Tolerance: good.  Monitoring: Pulse and BP monitoring, Oximetry used. Supplemental O2 given.  Colon Prep Used Phospho Soda for colon prep. Dose Used: Pharmacist, community.  Sedation Meds: Patient assessed and found to be appropriate for moderate (conscious) sedation. Fentanyl 50 mcg. given IV. Versed 7 mg. given IV.  Findings DIVERTICULOSIS: Sigmoid Colon. ICD9: Diverticulosis, Colon: 562.10.  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  Constriction: partial. Comments: EDEMATOUS AND STENOTIC AREA 20-30 CM REGION, NO DISCRETE MASS, LOOKS BETTER THAN BEFORE.  - NORMAL EXAM: Rectum.   Assessment Abnormal examination, see findings above.  Diagnoses: 562.10: Diverticulosis, Colon.   Comments: STENOTIC DISTAL SIGMOID LOOKS BETTER. I DO  NOT SEE A TUMOR. SUSPECT RELATED TO PRIOR OR ONGOING ISCHEMIA VS. DIVERTICULITIS. WILL CONTINUE METRONIDAZOLE AND CIPRO FOR A TOTAL OF 2 WEEKS. Events  Unplanned Intervention: DECOMPRESSION, SEE BELOW.  Intervention Medications: Hyoscamine  Abdominal pain.  Intervention Results: The intervention was successful.  Plans Disposition: After procedure patient sent to recovery. After recovery patient sent home.  Comments: I THINK HE SHOULD SEE A VASCULAR SURGEON ABOUT ARTERIAL CALCIFICATIONS AND FURTHER EVALUATION VS GENERAL SURGEON OR BOTH. WILL DISCUSS. 1600 HRS: HE HAD ABDOMINAL DISTENTION  AND PAIN AFTER THE PROCEDURE SO I WENT BACK AND DECOMPRESSED THE BOWEL PROXIMAL TO THE STENOSIS WITH GOOD INITIAL RESULT. WILL MONITOR HIM AND RELEASE IF HE FEELS WELL ENOUGH. STAY ON LIQUIDS TONIGHT.   CC:   Geoffry Paradise, MD   Roger Shelter, MD  This report was created from the original endoscopy report, which was reviewed and signed by the above listed endoscopist.   Appended Document: Flex Sig: Diverticulosis Clinical Lists Changes  Observations: Added new observation of FLEX SIGMOID: Results: Diverticulosis. Comments: STENOTIC DISTAL SIGMOID LOOKS BETTER. I DO NOT SEE A TUMOR. SUSPECT RELATED TO PRIOR OR ONGOING ISCHEMIA VS. DIVERTICULITIS. WILL CONTINUE METRONIDAZOLE AND CIPRO FOR A TOTAL OF 2 WEEKS. (11/09/2004 13:39)   Flexible Sigmoidoscopy  Procedure date:  11/09/2004  Findings:      Results: Diverticulosis. Comments: STENOTIC DISTAL SIGMOID LOOKS BETTER. I DO NOT SEE A TUMOR. SUSPECT RELATED TO PRIOR OR ONGOING ISCHEMIA VS. DIVERTICULITIS. WILL CONTINUE METRONIDAZOLE AND CIPRO FOR A TOTAL OF 2 WEEKS.  Comments:      Location: [facility].

## 2010-12-06 NOTE — Consult Note (Signed)
Summary: [facility]  [facility]   Imported By: Lennie Odor 12/09/2009 11:35:16  _____________________________________________________________________  External Attachment:    Type:   Image     Comment:   External Document

## 2010-12-06 NOTE — Procedures (Signed)
Summary: ERCP  Patient: Kirk [name] Note: All result statuses are Final unless otherwise noted.  Tests: (1) ERCP (ERC)   ERC ERCP                  DONE (C)     [facility]     [address] [place], [place]  [address]           ERCP PROCEDURE REPORT           PATIENT:  Kirk [name], Kirk [name]  MR#:  [id number]     BIRTHDATE:  [birth year]  GENDER:  male           ENDOSCOPIST:  Iva Boop, MD, [facility]           PROCEDURE DATE:  11/23/2009     PROCEDURE:  ERCP           INDICATIONS:  abnormal imaging, abnormal Liver Function Tests     dilated extra and intrahepatics on CT           MEDICATIONS:  None  ADDENDUM-CORRECTION: Fentanyl 62.5ug IV and     Versed 6 mg IV given     TOPICAL ANESTHETIC:  Cetacaine Spray           DESCRIPTION OF PROCEDURE:   After the risks benefits and     alternatives of the procedure were thoroughly explained, informed     consent was obtained.  The  endoscope was introduced through the     mouth and advanced to the second portion of the duodenum. The     esophagus was not seen well due to side-viewing scope. The stomach     and duodenum did appear normal through the side-view scope.           The shortwire sphincterotome was easily inserted deeply into the     bile duct through a normal-appearing papilla. A dilatation was     found in the total biliary tree. Maximum diameter CBD 14 mm. No     stones or strictures seen. Contrast drained and bile was draining     prior to insertion of the sphincterotome. Balloon sweep negative     for stones. (Balloon deflated before pull-through of ampulla).     Ampulla and very distal CBD was brushed for cytology. As time went     on there was some prominence of the duodenal mucosa above the     ampulla but no mucosal abnormailities evident there or with     papilla.   The pancreas was not cannulated by intent. The scope     was then completely withdrawn from the patient and the procedure     terminated.    <<PROCEDUREIMAGES>>           COMPLICATIONS:  None           ENDOSCOPIC IMPRESSION:     1) Dilatation in the total biliary tree of uncclear     significance. No obstruction identified.     RECOMMENDATIONS:     await cytoloogy results - I will notify     may need serologic w/u of LFT's     ? EUS     he has no pain or symptoms so no sphincterotomy today           Iva Boop, MD, [facility]           CC:  Geoffry Paradise, M.D.     The Patient  n.     REVISED:  11/23/2009 02:31 PM     eSIGNED:   Iva Boop at 11/23/2009 02:31 PM           Angela Cox, [id number]  Note: An exclamation mark (!) indicates a result that was not dispersed into the flowsheet. Document Creation Date: 11/23/2009 2:30 PM _______________________________________________________________________  (1) Order result status: Final Collection or observation date-time: 11/23/2009 14:07 Requested date-time:  Receipt date-time:  Reported date-time:  Referring Physician:   Ordering Physician: Stan Head [id number]) Specimen Source:  Source: Launa Grill Order Number: [id number] Lab site:   Appended Document: ERCP   ERCP  Procedure date:  11/23/2009  Findings:      abnormal: Location: [facility]. ENDOSCOPIC IMPRESSION:     1) Dilatation in the total biliary tree of uncclear     significance. No obstruction identified.  ***Microscopic Examination and Diagnosis*** STATEMENT Of SPECIMEN ADEQUACY: Satisfactory for evaluation. INTERPRETATION(S): BILE DUCT BRUSHING, AMPULA: Atypical cells PRESENT, see comment. Comment: There are a few small groups of atypical glandular epithelium with prominent nucleoli suspicious for malignancy.

## 2010-12-06 NOTE — Procedures (Signed)
Summary: Upper Endoscopy  Patient: Kirk [name] Note: All result statuses are Final unless otherwise noted.  Tests: (1) Upper Endoscopy (EGD)   EGD Upper Endoscopy       DONE (C)     [facility]     [address] [place], [place]  [address]           ENDOSCOPY PROCEDURE REPORT           PATIENT:  [name], [name]  MR#:  [id number]     BIRTHDATE:  01-24-1927, 82 yrs. old  GENDER:  male           ENDOSCOPIST:  Iva Boop, MD, [facility]           PROCEDURE DATE:  12/07/2009     PROCEDURE:  EGD with foreign body removal     ASA CLASS:  Class II     INDICATIONS:  foreign body remove indwelling pancreatic stent           MEDICATIONS:   Fentanyl 50 mg IV, Versed 6 mg IV ADDENDUM: Cipro     400 mg IV given on call     TOPICAL ANESTHETIC:  Cetacaine Spray           DESCRIPTION OF PROCEDURE:   Prior to sedation and insertion of     duodenoscope, fluoroscopy was used to confirm that stent was still     in the pancreas.  After the risks benefits and alternatives of the     procedure were thoroughly explained, informed consent was     obtained.  The  duodenoscope was introduced through the mouth and     advanced to the second portion of the duodenum, without     limitations.  The instrument was slowly withdrawn as the mucosa     was fully examined.     <<PROCEDUREIMAGES>>           The esophagus was not well seen with the side-viewing     duodenoscope. The stomach was entered and  examined. The antrum,     angularis, and lesser curvature were visualized, including a     retroflexed view of the cardia and fundus. The stomach wall was     normally distensable. views with the side-viewing scope were     normal. The scope passed easily through the pylorus into the     duodenum.  The duodenal bulb was normal in appearance, as was the     postbulbar duodenum though there was a pancreatic stent protruding     from the ampulla (pigtail stent). It was grasped with a snare and  removed. Fluor spot images confirm that it was removed also.     Retroflexed views revealed no abnormalities.    The scope was then     withdrawn from the patient and the procedure completed.           COMPLICATIONS:  None           ENDOSCOPIC IMPRESSION:     1) Normal EGD with limited views of the esophagus (see above)     2) Successful removal of pancreatic stent.     RECOMMENDATIONS:     I will arrange office follow-up in the next few weeks to     reassess and determine/discuss further work-up of dilated bile     duct.           Clear liquids tonight. Resume prior diet tomorrow.  Iva Boop, MD, [facility]           CC:  Geoffry Paradise, MD     The Patient           n.     REVISED:  12/07/2009 02:35 PM     eSIGNED:   Iva Boop at 12/07/2009 02:35 PM           Angela Cox, [id number]  Note: An exclamation mark (!) indicates a result that was not dispersed into the flowsheet. Document Creation Date: 12/07/2009 2:35 PM _______________________________________________________________________  (1) Order result status: Final Collection or observation date-time: 12/07/2009 13:55 Requested date-time:  Receipt date-time:  Reported date-time:  Referring Physician:   Ordering Physician: Stan Head [id number]) Specimen Source:  Source: Launa Grill Order Number: [id number] Lab site:   Appended Document: Upper Endoscopy   EGD  Procedure date:  12/07/2009  Findings:      Location: [facility] ENDOSCOPIC IMPRESSION:     1) Normal EGD with limited views of the esophagus (see above)     2) Successful removal of pancreatic stent.

## 2010-12-06 NOTE — Progress Notes (Signed)
Summary: call to him re: LFT's  Phone Note Outgoing Call   Summary of Call: I called him re: labs LFT's not yet done he is ok will come tomorrow for hepatic function panel we need to set up order for tomorrow Iva Boop MD, [facility]  February 03, 2010 11:47 AM   Follow-up for Phone Call        Pt. notified the order is in the computer, please come by the lab anytime tomorrow. Pt. instructed to call back as needed.  Follow-up by: Laureen Ochs LPN,  February 03, 2010 11:58 AM

## 2010-12-06 NOTE — Assessment & Plan Note (Signed)
Summary: FOLLOW UP LIVER TESTS/SP   History of Present Illness Visit Type: Follow-up Visit Primary GI MD: Stan Head MD [facility] Primary Lendon George: Geoffry Paradise, MD Requesting Kurt Azimi: na Chief Complaint: Elevated Liver Function Test  History of Present Illness:   77 yo wm with a dilated bile duct and abnormal LFT's of unclear significance.  He is having episodic, sudden abdominal pain that is severe. He bends over for relief and then rests and it resolves spontaneously over a few hrs, he usually sleeps it off. It occure every few weeks. Cannot identify triggres. "I guess it is kind of an ongoing thing" - not necessarily new.  No change in bowels or bladder function. 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   GI Review of Systems    Reports abdominal pain.     Location of  Abdominal pain: generalized.    Denies acid reflux, belching, bloating, chest pain, dysphagia with liquids, dysphagia with solids, heartburn, loss of appetite, nausea, vomiting, vomiting blood, weight loss, and  weight gain.      Reports liver problems.     Denies anal fissure, black tarry stools, change in bowel habit, constipation, diarrhea, diverticulosis, fecal incontinence, heme positive stool, hemorrhoids, irritable bowel syndrome, jaundice, light color stool, rectal bleeding, and  rectal pain.    Current Medications (verified): 1)  Diclofenac Sodium Cr 100 Mg Xr24h-Tab (Diclofenac Sodium) .... As Needed 2)  Finasteride 5 Mg Tabs (Finasteride) .... Take 1 Tablet By Mouth Once A Day 3)  Hydrocodone-Acetaminophen 5-500 Mg Tabs (Hydrocodone-Acetaminophen) .... As Needed For Pain 4)  Lisinopril 10 Mg Tabs (Lisinopril) .... Take 1 Tablet By Mouth Once A Day 5)  Protonix 40 Mg  Tbec (Pantoprazole Sodium) .Marland Kitchen.. 1 Each Day 30 Minutes Before Meal As Needed 6)  Aspirin 81 Mg Tbec (Aspirin) .... Take 1 Tablet By Mouth Once A Day 7)  Claritin 10 Mg Tabs  (Loratadine) .... As Needed  Allergies (verified): 1)  ! * Mycins  Past History:  Past Medical History: Reviewed history from 06/03/2010 and no changes required. Myocardial Infarction 1999 Hyperlipidemia GERD Coronary Artery Disease Hypertension Arthritis Diverticulitis Diverticulosis dilated bile duct, unclear cause post-ERCP pancreatitis  Past Surgical History: Reviewed history from 02/24/2010 and no changes required. Colon Resection Back Surgery CABGx4 Angioplasty 1999 Rotator Cuff Repair Cataract Extraction  Family History: Reviewed history from 11/17/2009 and no changes required. Family History of Lung Cancer: Father Family History of Diabetes: Mother Family History of Heart Disease: Mother, CHF No FH of Colon Cancer:  Social History: Alcohol Use - yes Occupation: Retired Married Patient currently smokes. Cigars Daily Caffeine Use Illicit Drug Use - no  Vital Signs:  Patient profile:   77 year old male Height:      66 inches Weight:      146 pounds BMI:     23.65 BSA:     1.75 Pulse rate:   80 / minute Pulse rhythm:   regular BP sitting:   136 / 74  (left arm) Cuff size:   regular  Vitals Entered By: Ok Anis CMA (September 23, 2010 9:48 AM)  Physical Exam  General:  Well developed, well nourished, no acute distress. Eyes:   no icterus. Abdomen:  prior surgical scares soft and nontender without HSM/mass    Impression & Recommendations:  Problem # 1:  ABNORMAL EXAM-BILIARY TRACT (ICD-793.3) Assessment Unchanged dilatedbile duct, unclear cause cytology with atypical cells early 2011 MRCP will  be used to reassess now  even if he has an early cancer of bile duct, it seems relatively asymptomatic, if not completely and would not operate he had post-ercp pancreatitis and would need a clear indication to repeat ERCP - and we do not have at this point though initial ERCP was appropriate  Orders: TLB-CMP (Comprehensive Metabolic Pnl)  ([id number]) MRCP (MRCP)  Problem # 2:  ALKALINE PHOSPHATASE, ELEVATED (ICD-790.5) Assessment: Unchanged  Problem # 3:  TRANSAMINASES, SERUM, ELEVATED (ICD-790.4) Assessment: Unchanged  Problem # 4:  ABDOMINAL PAIN OTHER SPECIFIED SITE (ICD-789.09) Assessment: New sounds like it may have been a chronic intermittent issue does not sound biliary but suppose it could be  Patient Instructions: 1)  Copy sent to : Geoffry Paradise, MD 2)  Please pick up your medications at your pharmacy. 3)  Hyoscyamne was prescribed to take as needed for abdominal pain. 4)  Your physician has requested that you have the following labwork done today: Liver tests and electrolytes (CMET) 5)  Your MRCP is scheduled for Wednesday, 09/28/10 at [facility].  See seperate instructions. 6)  The medication list was reviewed and reconciled.  All changed / newly prescribed medications were explained.  A complete medication list was provided to the patient / caregiver. Prescriptions: HYOSCYAMINE SULFATE 0.125 MG SUBL (HYOSCYAMINE SULFATE) 1-2 dissolved under tongue every 4 hours as needed fr abdomnal pain  #30 x 1   Entered and Authorized by:   Iva Boop MD, [facility]   Signed by:   Iva Boop MD, FACG on 09/23/2010   Method used:   Electronically to        [facility]  [phone]* (retail)       [address] [place], [place]  [address]       Ph: [phone] or [phone]       Fax: [phone]   RxID:   [id number]

## 2010-12-08 NOTE — Letter (Signed)
Summary: [facility]  [facility]   Imported By: Lester Culloden 10/25/2010 12:03:44  _____________________________________________________________________  External Attachment:    Type:   Image     Comment:   External Document

## 2011-01-11 ENCOUNTER — Encounter: Payer: Self-pay | Admitting: Internal Medicine

## 2011-01-17 NOTE — Letter (Signed)
Summary: Office Visit Letter  [facility]  [address]   [place], [place] [address]   Phone: [phone]  Fax: [phone]      January 11, 2011 MRN: [id number]   [facility] [address] [place], [place]  [address]   Dear Mr. [facility],   According to our records, it is time for you to schedule a follow-up office visit with [place].   At your convenience, please call [phone] (option #2)to schedule an office visit. If you have any questions, concerns, or feel that this letter is in error, we would appreciate your call.   Sincerely,   Stan Head, M.D.   [facility] [phone]

## 2011-01-22 LAB — DIFFERENTIAL
Basophils Absolute: 0 10*3/uL (ref 0.0–0.1)
Basophils Absolute: 0 10*3/uL (ref 0.0–0.1)
Basophils Relative: 0 % (ref 0–1)
Basophils Relative: 0 % (ref 0–1)
Eosinophils Absolute: 0.1 10*3/uL (ref 0.0–0.7)
Eosinophils Relative: 1 % (ref 0–5)
Eosinophils Relative: 1 % (ref 0–5)
Lymphocytes Relative: 18 % (ref 12–46)
Lymphocytes Relative: 7 % — ABNORMAL LOW (ref 12–46)
Lymphs Abs: 0.6 10*3/uL — ABNORMAL LOW (ref 0.7–4.0)
Lymphs Abs: 1.3 10*3/uL (ref 0.7–4.0)
Monocytes Absolute: 0.3 10*3/uL (ref 0.1–1.0)
Monocytes Absolute: 0.8 10*3/uL (ref 0.1–1.0)
Monocytes Relative: 10 % (ref 3–12)
Monocytes Relative: 3 % (ref 3–12)
Neutro Abs: 13.2 10*3/uL — ABNORMAL HIGH (ref 1.7–7.7)
Neutro Abs: 9.5 10*3/uL — ABNORMAL HIGH (ref 1.7–7.7)
Neutrophils Relative %: 69 % (ref 43–77)
Neutrophils Relative %: 82 % — ABNORMAL HIGH (ref 43–77)
Neutrophils Relative %: 90 % — ABNORMAL HIGH (ref 43–77)

## 2011-01-22 LAB — COMPREHENSIVE METABOLIC PANEL
AST: 209 U/L — ABNORMAL HIGH (ref 0–37)
AST: 53 U/L — ABNORMAL HIGH (ref 0–37)
Albumin: 3.6 g/dL (ref 3.5–5.2)
Alkaline Phosphatase: 133 U/L — ABNORMAL HIGH (ref 39–117)
Alkaline Phosphatase: 176 U/L — ABNORMAL HIGH (ref 39–117)
BUN: 12 mg/dL (ref 6–23)
BUN: 22 mg/dL (ref 6–23)
CO2: 22 mEq/L (ref 19–32)
CO2: 23 mEq/L (ref 19–32)
CO2: 26 mEq/L (ref 19–32)
Calcium: 8.1 mg/dL — ABNORMAL LOW (ref 8.4–10.5)
Calcium: 8.5 mg/dL (ref 8.4–10.5)
Chloride: 105 mEq/L (ref 96–112)
Chloride: 106 mEq/L (ref 96–112)
Chloride: 107 mEq/L (ref 96–112)
Creatinine, Ser: 0.89 mg/dL (ref 0.4–1.5)
Creatinine, Ser: 1.01 mg/dL (ref 0.4–1.5)
GFR calc Af Amer: >60 mL/min (ref >60)
GFR calc Af Amer: >60 mL/min (ref >60)
GFR calc Af Amer: >60 mL/min (ref >60)
GFR calc non Af Amer: 55 mL/min — ABNORMAL LOW (ref >60)
GFR calc non Af Amer: >60 mL/min (ref >60)
GFR calc non Af Amer: >60 mL/min (ref >60)
Glucose, Bld: 114 mg/dL — ABNORMAL HIGH (ref 70–99)
Glucose, Bld: 118 mg/dL — ABNORMAL HIGH (ref 70–99)
Glucose, Bld: 140 mg/dL — ABNORMAL HIGH (ref 70–99)
Potassium: 3.9 mEq/L (ref 3.5–5.1)
Potassium: 4.7 mEq/L (ref 3.5–5.1)
Sodium: 137 mEq/L (ref 135–145)
Total Bilirubin: 3.7 mg/dL — ABNORMAL HIGH (ref 0.3–1.2)
Total Bilirubin: 4.6 mg/dL — ABNORMAL HIGH (ref 0.3–1.2)
Total Bilirubin: 5.1 mg/dL — ABNORMAL HIGH (ref 0.3–1.2)
Total Protein: 6.1 g/dL (ref 6.0–8.3)
Total Protein: 7.2 g/dL (ref 6.0–8.3)

## 2011-01-22 LAB — CBC
HCT: 32.3 % — ABNORMAL LOW (ref 39.0–52.0)
HCT: 35.7 % — ABNORMAL LOW (ref 39.0–52.0)
Hemoglobin: 11 g/dL — ABNORMAL LOW (ref 13.0–17.0)
Hemoglobin: 11.8 g/dL — ABNORMAL LOW (ref 13.0–17.0)
Hemoglobin: 12 g/dL — ABNORMAL LOW (ref 13.0–17.0)
MCHC: 34.2 g/dL (ref 30.0–36.0)
MCHC: 34.2 g/dL (ref 30.0–36.0)
MCV: 95.6 fL (ref 78.0–100.0)
MCV: 95.9 fL (ref 78.0–100.0)
MCV: 96.2 fL (ref 78.0–100.0)
Platelets: 103 10*3/uL — ABNORMAL LOW (ref 150–400)
Platelets: 131 10*3/uL — ABNORMAL LOW (ref 150–400)
RBC: 3.37 MIL/uL — ABNORMAL LOW (ref 4.22–5.81)
RBC: 3.62 MIL/uL — ABNORMAL LOW (ref 4.22–5.81)
RBC: 3.77 MIL/uL — ABNORMAL LOW (ref 4.22–5.81)
RDW: 14.2 % (ref 11.5–15.5)
RDW: 14.4 % (ref 11.5–15.5)
WBC: 10.8 10*3/uL — ABNORMAL HIGH (ref 4.0–10.5)
WBC: 14.7 10*3/uL — ABNORMAL HIGH (ref 4.0–10.5)
WBC: 6.9 10*3/uL (ref 4.0–10.5)
WBC: 8.1 10*3/uL (ref 4.0–10.5)

## 2011-01-22 LAB — CULTURE, BLOOD (ROUTINE X 2)

## 2011-01-22 LAB — HEPATIC FUNCTION PANEL
ALT: 46 U/L (ref 0–53)
AST: 33 U/L (ref 0–37)
Albumin: 2.3 g/dL — ABNORMAL LOW (ref 3.5–5.2)
Alkaline Phosphatase: 138 U/L — ABNORMAL HIGH (ref 39–117)
Total Bilirubin: 3 mg/dL — ABNORMAL HIGH (ref 0.3–1.2)
Total Protein: 5.9 g/dL — ABNORMAL LOW (ref 6.0–8.3)

## 2011-01-22 LAB — AMYLASE: Amylase: 750 U/L — ABNORMAL HIGH (ref 0–105)

## 2011-01-22 LAB — CK TOTAL AND CKMB (NOT AT ARMC)
Relative Index: INVALID (ref 0.0–2.5)
Relative Index: INVALID (ref 0.0–2.5)
Total CK: 44 U/L (ref 7–232)

## 2011-01-22 LAB — LIPASE, BLOOD: Lipase: 14 U/L (ref 11–59)

## 2011-03-07 ENCOUNTER — Other Ambulatory Visit: Payer: Self-pay | Admitting: *Deleted

## 2011-03-07 DIAGNOSIS — E7800 Pure hypercholesterolemia, unspecified: Secondary | ICD-10-CM

## 2011-03-21 ENCOUNTER — Ambulatory Visit (INDEPENDENT_AMBULATORY_CARE_PROVIDER_SITE_OTHER): Payer: Medicare Other | Admitting: Internal Medicine

## 2011-03-21 ENCOUNTER — Encounter: Payer: Self-pay | Admitting: Internal Medicine

## 2011-03-21 DIAGNOSIS — R932 Abnormal findings on diagnostic imaging of liver and biliary tract: Secondary | ICD-10-CM

## 2011-03-21 DIAGNOSIS — G8929 Other chronic pain: Secondary | ICD-10-CM | POA: Insufficient documentation

## 2011-03-21 DIAGNOSIS — R1013 Epigastric pain: Secondary | ICD-10-CM

## 2011-03-21 DIAGNOSIS — R748 Abnormal levels of other serum enzymes: Secondary | ICD-10-CM

## 2011-03-21 MED ORDER — HYOSCYAMINE SULFATE 0.125 MG PO TBDP: ORAL_TABLET | ORAL | Status: DC

## 2011-03-21 MED ORDER — HYOSCYAMINE SULFATE 0.125 MG PO TBDP: 0.1250 mg | ORAL_TABLET | ORAL | Status: DC

## 2011-03-21 NOTE — Patient Instructions (Addendum)
Continuous her current medications. If your abdominal pain pattern changes such as increasing or worsening or does not respond to medication called Dr. Leone Payor back. Otherwise return for a routine visit in about 6 months, would be around November 2012. Will let Dr. Jacky Kindle know about this visit also. Your prescription has been sent to your pharmacy.

## 2011-03-21 NOTE — Progress Notes (Signed)
  Subjective:    Patient ID: Lopez Dentinger, male    DOB: Feb 17, 1927, 77 y.o.   MRN: [id number]  HPI 77 yo wm with dilated bile duct and intermittent epigastric pain of unclear etiology. Here for routine follow-up. Sudden, episodic abdominal pain episodes. If he rests and takes hyoscyamine it will resolve. Occurs once a month. Epigastric in location. Bending forward in a chair also helps. May be up to a few hours, 4 maximum. The next day he is fne. No obvious trigers. simlar to what he has been dealing with. Weight is increasing. Eating at cafeterias. Avoids meat, he feels better on vegetables. Overall he thinks things are improved. Remains active and vigorous (e.g. Used tractor to pull out brush and wood last month) Review of Systems As per HPI Had shingles since last visit here in 09/2010, occurred on buttocks    Objective:   Physical Exam WDWN elderly white man Eyes anicteric Heart S1S2 no gallops or murmurs Abdomen is soft and nontender without HSM/massBS+ Alert and oriented with appropriate mod and affect       Assessment & Plan:

## 2011-03-21 NOTE — Assessment & Plan Note (Signed)
Has been no specific trend such that these are rising. We'll let the be followup for routine cardiology and primary care labs at this point.

## 2011-03-21 NOTE — Op Note (Signed)
NAME:  ANTHEM, FRAZER NO.:  [id number]   MEDICAL RECORD NO.:  [id number]          PATIENT TYPE:  INP   LOCATION:  0002                         [facility]ACILITY[facility]  [facility]   PHYSICIAN:  Jene Every, M.D.    DATE OF BIRTH:  1926-12-05   DATE OF PROCEDURE:  10/17/2007  DATE OF DISCHARGE:                               OPERATIVE REPORT   PREOPERATIVE DIAGNOSIS:  Spinal stenosis, 4-5.   POSTOPERATIVE DIAGNOSIS:  Spinal stenosis, 4-5, spinal stenosis 3-4.   PROCEDURE:  Central decompression at 4/5 by laminectomy of 4, bilateral  hemilaminotomy of 5, decompression of 3-5, foraminotomies of 4 and 5.   ANESTHESIA:  General.   ESTIMATED BLOOD LOSS:  50 mL   ASSISTANT:  Alvy Beal, M.D.   BRIEF HISTORY AND INDICATIONS:  A 77 year old with neurogenic  claudication secondary to spinal stenosis predominantly at 4/5,  multifactorial and severe, refractory to conserve treatment.  Operative  intervention is indicated for decompression of 4-5 and possibly the  adjacent segments.  The risks and benefits have been discussed,  including bleeding, infection, damage to neurovascular structures, CSF  leakage, epidural fibrosis, adjacent segment disease, need for fusion in  the future, and anesthetic complications, etc.   TECHNIQUE:  The patient was placed in supine position after induction of  adequate general anesthesia and 1 gram Kefzol.  He was placed prone on  the [place] frame.  All bony prominences well-padded.  The lumbar region  was prepped and draped in the usual sterile fashion.   Two 18-gauge spinal needles utilized to localize the 4-5 interspace,  confirmed with x-ray.  Incision was made from spinous process 3 through  5.  The subcutaneous tissue was dissected and electrocautery utilized to  achieve hemostasis.  Dorsolumbar fascia identified and divided in the  line of the skin incision.  Paraspinous muscle elevated from the lamina  of 3, 4, and 5.  McCullough retractor  was placed.  Kocher on the 4  spinous process, confirmed by radiograph.  The spinous processes of 4  and partially of 3 and 5 removed with Leksell rongeur.  Next, a 2-mm  Kerrison was utilized to perform a decompression of 4-5.  We started on  the caudad edge of 4, detaching the ligamentum flavum from the cephalad  edge.  This was performed bilaterally.  Severe stenosis noted.  This  required complete laminectomy of 4 and lateral recess stenosis of 3-4,  and this was decompressed utilizing the operating microscope.  Distally,  we performed a hemilaminotomy bilaterally of L5 and foraminotomies.  There was severe stenosis out into the foramen and into the lateral  recess and particularly in the foramen of 4.  This was undercut with the  2-mm Kerrison to the extent of the epidural venous plexus, and bipolar  electrocautery was utilized to achieve hemostasis.  There was a hardened  disk that was noted, not herniated.  Therefore, it was left in situ.  We  used bone wax on the cancellous surfaces.  After decompression of the  lateral recesses at 3/4 and at 4/5 with foraminotomies of 4 and  5,  hockey stick probe passed freely through the foramen of 4 and 5.  Inspection revealed restoration of the thecal sac.  There was no CSF  leakage or active bleeding.  I placed FloSeal in the wound after copious  irrigation with antibiotic irrigation.  Inspection revealed no evidence  of CSF leakage or active bleeding.  The McCullough retractor was  removed.  The paraspinous muscles were actively irrigated.  The  dorsolumbar fascia was reapproximated with #1 Vicryl interrupted figure-  of-eight sutures.  The subcutaneous tissue reapproximated with 2-0  Vicryl simple sutures.  The skin was reapproximated with staples.  The  wound was dressed sterilely.  He was placed supine on the hospital bed,  extubated without difficulty, and transported to the recovery room in  satisfactory condition.   The patient  tolerated the procedure well, and there were no  complications.      Jene Every, M.D.  Electronically Signed     JB/MEDQ  D:  10/17/2007  T:  10/17/2007  Job:  [id number]

## 2011-03-21 NOTE — Assessment & Plan Note (Signed)
He seems stable overall. Would not repeat any imaging now. It's not clear that his epigastric pain is from this though it could be. No worrisome signs at this point. Given his age and comorbidities, he certainly would not tolerate a surgery well and there is been no definitive evidence of cancer or other serious problems so we'll continue observe and treat symptoms. I would plan to see him back routinely in about 6 months. He will have routine LFTs checked in cardiology soon and we can followup on those over time.

## 2011-03-21 NOTE — Assessment & Plan Note (Addendum)
He continues to have chronic and intermittent upper abdominal pain. Today he points to the epigastric area. He thinks it is overall better than it had been. It responds to hyoscyamine. He has not lost weight, when he had his pancreatic stent in 2011 there were no ulcers or problems there and he is on a PPI. ? If related to bile duct dilation - possible but not entirely clear. We'll continue his anti-spasmodic, hyoscyamine and follow along expectantly. He understands and supports this plan of action. He wants no invasive testing unless necessary. He is happy with quality of life at this time.

## 2011-03-24 NOTE — Discharge Summary (Signed)
NAME:  Kirk [name], Kirk [name] NO.:  [id number]   MEDICAL RECORD NO.:  [id number]                   PATIENT TYPE:  INP   LOCATION:  2007                                 FACILITY:  [facility]   PHYSICIAN:  Evelene Croon, M.D.                  DATE OF BIRTH:  Feb 20, 1927   DATE OF ADMISSION:  01/11/2004  DATE OF DISCHARGE:  01/15/2004                                 DISCHARGE SUMMARY   ADMISSION DIAGNOSIS:  Severe three-vessel coronary artery disease.   SECONDARY DIAGNOSES:  1. Severe three-vessel coronary artery disease status post coronary artery     bypass grafting.  2. Postoperative anemia status post blood transfusion.  3. Postoperative thrombocytopenia, resolving, unclear etiology.  Negative     heparin-induced thrombocytopenia panel on January 14, 2004.  4. Postoperative hypoglycemia without definitive diagnosis of diabetes     mellitus.  Sugar up to 256 but trending down prior to discharge.  5. Postoperative transient atrial fibrillation, controlled rate, now     resolved and in sinus rhythm on metoprolol.  6. Hyperlipidemia on Lipitor.  7. History of gastroesophageal reflux disease, untreated on admission,     started on Protonix this hospitalization due to complaints of heartburn.  8. Past history of tobacco use.  9. History of rotator cuff surgery.  10.      History of pneumonia.  11.      History of abnormal CT scan of the chest with small nodule on the     right lung which is followed by Dr. Deborah Chalk.   PROCEDURES:  On January 11, 2004, Kirk [name] underwent median sternotomy,  extracorporeal circulation, coronary artery bypass graft surgery x 4 using a  left internal mammary artery to the left anterior descending coronary  artery, saphenous vein graft to the diagonal branch of the left anterior  descending artery, saphenous vein graft to the obtuse marginal branch of the  left circumflex coronary artery, and saphenous vein graft to the posterior  descending branch of the right coronary artery, and endoscopic vein  harvesting from both thighs.  The surgeon was Dr. Evelene Croon.   LUNGS:  PENICILLIN as well as some MYCIN DRUGS.   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  He subsequently underwent repeat angioplasty of the left circumflex in July  1992.  His cardiologist is Dr. Roger Shelter.  He presented to Dr. Deborah Chalk  recently with several-month history of fatigue and burning chest pain.  Stress Cardiolite exam was done showing worsening T wave changes with  exercise and inferior ischemic with well preserved left ventricular  function.  He subsequently underwent cardiac catheterization on December 28, 2003, which showed an 80% mid LAD stenosis after the second diagonal  branch.  The second diagonal branch itself was a small vessel but had 60 to 70%  ostial stenosis.  The left circumflex was calcified proximally with 30 to  40% stenosis.  The right coronary artery was occluded in its mid portion  with filling of the large distal vessel by collaterals from the LAD.  Attempted angioplasty of the right coronary artery was unsuccessfully.  At  this point, it was felt the patient would benefit from coronary  revascularization, and the patient was referred to Dr. Evelene Croon.  After  review of the angiogram and examination of the patient, Dr. Laneta [name] did feel  that coronary artery bypass graft surgery was the best treatment to prevent  further ischemia and infarction and to improve his quality of life.  After  discussing the risks, benefits, and alternatives with the patient and his  wife, the patient did agree to proceed. This was tentatively scheduled for  January 11, 2004, at [facility].   HOSPITAL COURSE:  On January 11, 2004, Mr, [name] was  admitted to [facility] and did undergo coronary artery bypass graft surgery as discussed  above.  The patient tolerated this procedure well and was transferred from  the operating room to the surgical intensive care unit in stable condition.  He was maintaining normal sinus rhythm.  His hemoglobin was noted to have a  significant decrease at 6.5, and he was treated with packed red blood cell  transfusions.  His other labs remained stable.   On postoperative day #1, Kirk [name] was doing well.  He was afebrile with a  blood pressure of 102/40.  His heart rate was in sinus rhythm at 60.  He was  atrially paced at 80.  His chest tube output was noted to be minimal, and  his chest tubes were later discontinued without incident.  His hemoglobin  and hematocrit had increased to 9.9 and 29, respectively, status post his  blood transfusions.  His lungs were clear.  Prior to that morning, he had  been extubated neurologically intact.  Due to his borderline blood pressure  and heart rate of 60, he was initially started on a beta blocker.  He was,  however, started on diuretic therapy as his weight was approximately 6  pounds over his baseline.  He was transferred that day to the floor with  orders to begin mobilization.   Over the next several days, Kirk [name] progressed well.  He did have a very  brief episode of atrial fibrillation up to 100.  This did resolve on its  own.  By this time, his blood pressure was elevated up to 141/81.  It was  felt that he could tolerate a beta blocker which would not only help his  blood pressure but also his heart rate.  He was started on Lopressor 25 mg  twice a day.  He was also weaned from supplemental oxygen and was saturating  above 90% on room air.  Of note on postoperative day #2, his platelet count  was noted to be significantly decreased at 43.  Previously it had been slightly decreased at 97.  Based on this result, all heparin including   flushes was discontinued.  Heparin-induced thrombocytopenia panel was  ordered which showed negative results.  On the following day, a CBC was  rechecked and showed a significant improvement from 43 to 121.  Other lab  abnormalities include elevated blood glucose readings up to 156; however,  these did  begin trending down.  His last fasting blood glucose reading was  133.  His baseline glucose was 114.  It is unclear if this reading was  fasting, however.   By postoperative day #10, Mr. Glasheen was mobilizing independently.  His  bowel and bladder functions were working appropriately.  He was maintaining  sinus rhythm on Lopressor.  He also remained afebrile.  His latest blood  pressure was 120/70.  His incisions were clean and dry without signs of  infection.  His lungs were clear.  His extremities did show trace edema, and  he was continued on diuretic therapy for this.  His pain was also controlled  on oral medication.  He did, however, complain of heartburn.  He had taken  Protonix in the past for gastroesophageal reflux disease.  This was  restarted.  Based on his excellent progression, it was felt that he would be  stable for discharge the following day, January 15, 2004.  Official discharge  orders would be written pending patient's status that morning including  morning rounds and reevaluation of his heart rhythm.   LABORATORY DATA:  On January 14, 2004, his white blood count was 11.9,  hemoglobin 11.3, hematocrit 33, platelet count 121.  On January 14, 2004, his  heparin-induced thrombocytopenic panel was negative.  Sodium 134, potassium  4.5, BUN 13, creatinine 0.8, blood glucose 133.   His chest x-ray on January 13, 2004, showed bibasilar postoperative atelectasis  plus pneumothorax.  There was a small left pleura effusion.   DISCHARGE MEDICATIONS:  1. Enteric-coated aspirin 325 mg 1 p.o. daily.  2. Lopressor 25 mg 1 p.o. b.i.d.  3. Lipitor 10 mg 1 p.o. daily.  4. Proscar 5 mg 1  p.o. daily.  5. Protonix 1 p.o. daily x 1 month, then as directed by Dr. Jacky Kindle.  6. Lasix 40 mg 1 p.o. daily x 7 days.  7. Potassium chloride 20 mEq 1 p.o. daily x 7 days.  8. Tylox 1 to 2 tablets p.o. q.4-6h. p.r.n. pain.   ACTIVITY:  He is instructed to avoid driving, heavy lifting of more than 10  pounds, and strenuous activity.  He may continue daily walking and breathing  exercise.   DIET:  He is to follow a low-fat, low-salt diet.   WOUND CARE:  He may shower and clean wound gently with mild soap and water.   He is to notify the CVTS office if he develops fever greater than 101 or  redness or drainage from his incision sites.   FOLLOW UP:  1. He is to follow up with Dr. Laneta [name] at the CVTS office on February 02, 2004,     at 11:30 a.m.  2. He is to follow up with Dr. Roger Shelter at [facility] in     two weeks.  He is to call [phone] to schedule this appointment.  He will    also have a chest x-ray done at this appointment.  He is instructed to     bring these films with him to followup appointment with Dr. Laneta [name].  3. He is to follow up with Dr. Geoffry Paradise in two to three weeks to     evaluate his blood glucose levels and platelet count.      Jerold Coombe, P.A.                  Evelene Croon, M.D.    AWZ/MEDQ  D:  01/14/2004  T:  01/16/2004  Job:  [id number]   cc:   Colleen Can. Deborah Chalk, M.D.  Fax: [phone]   Geoffry Paradise, M.D.  [address]  [place]  [place] [address]  Fax: [phone]

## 2011-03-24 NOTE — Cardiovascular Report (Signed)
[facility]  Patient:    Kirk [name], Kirk [name] Visit Number: [id number] MRN: [id number]          Service Type: CAT Location: [facility] 2899 13 Attending Physician:  Eleanora Neighbor Dictated by:   Colleen Can Deborah Chalk, M.D. Admit Date:  03/18/2002 Discharge Date: 03/18/2002                          Cardiac Catheterization  INDICATIONS:  [name] has previous myocardial infarction in 1991 with his last catheterization angioplasty in 1992.  He had follow-up stress testing which showed mild lateral hypokinesia, but an ejection fraction of 45%. There is questionable lateral ischemia noted.  He is referred for follow-up study.  PROCEDURE:  Left heart catheterization with selective coronary angiography, left ventricular angiography.  TYPE AND SITE OF ENTRY:  Percutaneous right femoral artery with Perclose.  CATHETERS:  6 French 4 curved Judkins right and left coronary catheters, 6 French pigtail ventriculography catheter.  CONTRAST:  Omnipaque.  MEDICATIONS:  During the procedure; Versed 2 mg IV.  Prior to the procedure; Valium 10 mg p.o.  COMMENTS:  The patient tolerated the procedure well.  HEMODYNAMIC DATA:  The aortic pressure was 153/44, LV 151/21.  There is no aortic valve gradient noted on pullback.  ANGIOGRAPHIC DATA: 1. Left main coronary artery had calcification, but no significant obstructive    disease. 2. Left anterior descending had calcification proximally.  It bifurcated in    its midportion.  There was 50 to 60% narrowing at the level of this    bifurcation, but did not appear to be critical in nature.  There are    diffuse irregularities otherwise. 3. Left circumflex is a reasonably large vessel with moderate size distal    branches.  There is mild calcification especially in the midportion with a    20 to 30% segmental plaque present.  There is no significant focal    obstructive disease. 4. Right coronary artery is a  dominant vessel.  In its midportion, there is    40 to 50% segmental narrowing.  This is a large right coronary artery with    four distinct branches to the inferior wall.  LEFT VENTRICULAR ANGIOGRAM:  Left ventricular angiogram is performed in RAO position.  Overall cardiac size was normal.  There was mild lateral hypokinesia.  The global ejection fraction is estimated to be approximately 45 to 50%.  There was no mitral regurgitation or intracardiac calcification present.  OVERALL IMPRESSION: 1. Mild left ventricular dysfunction. 2. Mild to moderate three vessel coronary artery disease (20% left circumflex,    50 to 60% mid LAD, 40 to 50% segmental right coronary artery).  DISCUSSION:  Will continue to manage [name] medically.  He did have Perclose closure device with Vancomycin as a prophylactic antibiotic. Dictated by:   Colleen Can Deborah Chalk, M.D. Attending Physician:  Eleanora Neighbor DD:  03/18/02 TD:  03/19/02 Job: [id number]

## 2011-03-24 NOTE — Op Note (Signed)
NAME:  GERIK, COBERLY                     ACCOUNT NO.:  [id number]   MEDICAL RECORD NO.:  [id number]                   PATIENT TYPE:  AMB   LOCATION:  DAY                                  FACILITY[facility]  [facility]   PHYSICIAN:  Ronnell Guadalajara, M.D.                DATE OF BIRTH:  1927/01/29   DATE OF PROCEDURE:  02/12/2003  DATE OF DISCHARGE:                                 OPERATIVE REPORT   SURGEON:  Ronnell Guadalajara, M.D.   ASSISTANT:  Cherly Beach, P.A.-C  and Clarene Reamer, P.A.-C.   DESCRIPTION OF PROCEDURE:  After suitable general anesthesia, he was  positioned in the shoulder frame in the barber chair position and prepped  and draped routinely. An incision was made through the old saber incision  and the deltoid is taken off of the anterior acromion and the anterior part  of the clavicle. The clavicle is freed up posteriorly and the distal  clavicle excised with the saw. On opening the subacromial space, copious  fluid extruded compatible with a large tear. A heavy elevator was placed  underneath the acromion, a saw used to bevel back the undersurface of the  acromion for better visualization. Most of the tear was posteriorly and  retracted. There was some good anterior attachment with some very worn and  withered tendon in between. As we freed up the posterior half of the cuff  and pulled it forward, we could overlap that withered tissue and I plan to  do that with interrupted suture. In the posterior aspect, a trough was made  in the humeral head and proximally they were all roughened up with a bur.  Two drill holes were placed through that area for two independent sutures  and just above that in a little portion of the humeral head, a four prong  Mitek was placed to take some stress off the suture line. A #1 Tycron was  passed through the two suture holes up into the tendon, independent free  sutures and then the two sutures from the Mitek were passed through it. The  tendon was pulled down, the two Mitek sutures were tied and then the other  two sutures were tied brining it back into nice approximation. We then used  some #1 PDS to close the gap from proximal to distal between the anterior  and posterior portion trying to bury some of the weak and ineffective tendon  beneath the two flaps as they had thickened almost like a graft. Nice  closure was felt to have been accomplished, good restoration of the cuff.  Some additional PDS additionally as well. Two drill holes were in the  acromion. The coracoacromial was released from above the coracoid so we  could reattach it along with the deltoid through one of the drill holes. A  bone wax had been placed over the distal end of the clavicle. A Gelfoam pad  with 0.5% Marcaine with adrenaline  were placed into it as well and some of  the 0.5% Marcaine with adrenaline were injected into the tissue as well.  Four sutures total of PDS were passed through the two drill holes  reapproximating the deltoid and then over sewing the little distal split in  the deltoid that had been done as well and some 2-0 coated Vicryl and then a  running 3-0 Vicryl beneath the skin in the subcuticular with Steri-Strips.  Went into an abduction pillow splint and goes to recovery in good condition.                                               Ronnell Guadalajara, M.D.    PC/MEDQ  D:  02/12/2003  T:  02/12/2003  Job:  [id number]

## 2011-03-24 NOTE — H&P (Signed)
[facility]  Patient:    Kirk [name], Kirk [name]                        MRN: [id number] Adm. Date:  07/27/00 Attending:  Philips J. Montez Morita, M.D. Dictator:   Alexzandrew L. Julien Girt, P.A.-C. CC:         Colleen Can. Deborah Chalk, M.D.  Radene Knee., M.D.  Richard A. Jacky Kindle, M.D.   History and Physical  DATE OF BIRTH:  1926/12/29.  DATE OF OFFICE VISIT HISTORY AND PHYSICAL:  July 23, 2000.  CHIEF COMPLAINT:  Right shoulder pain.  HISTORY OF PRESENT ILLNESS:  Patient is a 77 year old male who has been seen by Dr. Michael Litter. Montez Morita for some time for right shoulder pain.  He has been seen and evaluated and treated conservatively for his shoulder pain; however, he has failed to improve with conservative measures.  He has been on various analgesics recently, taking Lorcet, Tylenol No. 3 and also Vicodin.  He was sent for an MRI of his shoulder earlier this year which did prove to be positive for a superimposed full-thickness tear; some acromioclavicular degenerative changes were also appreciated with a down-sloping acromion. Patient has not improved with conservative measures and wishes to seek surgical intervention.  Patient was seen and evaluated and it was decided the patient would be scheduled for surgery.  Risks and benefits of the procedure were discussed with the patient and he has elected to proceed with surgery.  ALLERGIES:  Patient had a reaction to a "MYCIN DRUG."  This drug caused a rash but he does not remember the name.  He think it POSSIBLY IS ERYTHROMYCIN, but is not sure.  CURRENT MEDICATIONS 1. Lipitor 10 mg p.o. daily. 2. Proscar 5 mg p.o. daily. 3. Celebrex 200 mg p.r.n. 4. Ecotrin daily -- this medication has been stopped prior to surgery. 5. He is also using analgesics consisting of Vicodin most recently.  PAST MEDICAL HISTORY:  History of chronic bronchitis, prostatitis, diverticulitis, allergic rhinitis, benign prostatic  hypertrophy, coronary artery disease, history of myocardial infarction, October of 1999, an old left wrist fracture, 1948.  PAST SURGICAL HISTORY:  Rotator cuff repair, May of 1990.  Cardiac catheterization with angioplasty in October of 1991 and again in 1992. Surgery for hydrocele in 1995.  SOCIAL HISTORY:  Patient is married and has no children.  He only has seldom intake of alcohol socially.  Tobacco intake is approximately four to six cigars a day.  He is semiretired.  He works as a Physiological scientist a garage and Optometrist.  He was also in the [employer] from [place] to [place] and served in the [place] conflict.  FAMILY HISTORY:  Mother deceased in her early 15s with congestive heart failure; father deceased in his early 47s with lung CA.  REVIEW OF SYSTEMS:  GENERAL:  No fevers, chills or night sweats.  NEUROLOGIC: No seizures, syncope or paralysis.  RESPIRATORY:  No shortness of breath, productive cough or hemoptysis.  CARDIOVASCULAR:  No chest pain, angina or orthopnea.  GI:  No nausea, vomiting, diarrhea or constipation.  GU:  No dysuria, hematuria or discharge.  MUSCULOSKELETAL:  Pertinent to that of the right shoulder found in the history of present illness.  PHYSICAL EXAMINATION  VITAL SIGNS:  Pulse 64, respirations 18, blood pressure 130/64.  GENERAL:  Patient is a 77 year old white male, well-nourished, well-developed, and appeared to be in no acute distress.  He appears to be  a good historian. He is alert and oriented, very pleasant at times during exam and visit.  HEENT:  Normocephalic, atraumatic.  Pupils round and reactive.  Patient is known to wear glasses.  Oropharynx is clear.  NECK:  Supple.  CHEST:  Clear to auscultation.  HEART:  Regular rate and rhythm with no murmurs.  No other adventitious sounds are appreciated.  ABDOMEN:  Soft, nontender.  Bowel sounds present.  Flat abdomen.  RECTAL:  Not done, not pertinent to present  illness.  BREASTS:  Not done, not pertinent to present illness.  GENITALIA:  Not done, not pertinent to present illness.  EXTREMITIES:  Significant to that of the right upper extremity.  Sensation is intact throughout the right upper extremity.  Motor function:  He is moving hand and fingers well on exam.  He does have active forward flexion above his head; however, he does have some pain with extreme impingement.  IMPRESSION 1. Torn right rotator cuff. 2. History of myocardial infarction, October of 1991. 3. Coronary artery disease. 4. History of prostatitis. 5. History of chronic bronchitis. 6. History of diverticulitis. 7. Allergic rhinitis. 8. Status post cardiac catheterization with subsequent angioplasty in 1991 and    again in 1992.  PLAN:  Patient will be admitted to [facility] to undergo a right rotator cuff repair.  Surgery will be performed by Dr. Ronnell Guadalajara. Patients medical physicians include Dr. Colleen Can. Deborah Chalk, Dr. Radene Knee. and Dr. Pearletha Furl. Jacky Kindle.  These physicians will be notified of the room number and admission and will be consulted if needed for any medical assistance with this patient throughout the hospital course. DD:  07/27/00 TD:  07/28/00 Job: [id number]

## 2011-03-24 NOTE — Cardiovascular Report (Signed)
NAME:  Kirk [name], Kirk [name]                     ACCOUNT NO.:  [id number]   MEDICAL RECORD NO.:  [id number]                   PATIENT TYPE:  OIB   LOCATION:  2004                                 FACILITY:  [facility]   PHYSICIAN:  Colleen Can. Deborah Chalk, M.D.            DATE OF BIRTH:  January 09, 1927   DATE OF PROCEDURE:  12/28/2003  DATE OF DISCHARGE:  12/29/2003                              CARDIAC CATHETERIZATION   PROCEDURES PERFORMED:  1. Left heart catheterization; with,  2. Selective coronary angiography.  3. Left ventricular angiography.  4. Attempted angioplasty of the right coronary artery.   CARDIOLOGIST:  Colleen Can. Deborah Chalk, M.D.   TYPE AND SITE OF ENTRY:  Percutaneous right femoral artery.   CATHETERS:  Six French 4 curved Judkins right and left coronary catheters.  Six French pigtail ventriculographic catheter.  Six French angioplasty  guide.  Multiple guidewires including; a [employer] floppy guidewire, a  _________ [employer] 4.5 guide, a Hi-Torque Cross-it 200, Hi-Torque  Cross-it 100, and a Luge, J-wire.   MEDICATIONS:  Medication given prior to the procedure; Valium 10 mg p.o.   Medications given during the procedure; Versed 2 mg IV, heparin and  Integrilin.   COMMENTS:  The patient in general tolerated the procedure well.  Multiple  guidewires were used in an attempt to cross the right coronary artery, but  these were unsuccessful.   HEMODYNAMIC DATA:  The aortic pressure is 157/77.  LV was 154/8-12.  There  is no aortic valve gradient noted on pullback.   ANGIOGRAPHIC DATA:  1. Left Main Coronary Artery:  The left main coronary artery had mild     irregularities, but was essentially normal.   1. Left Anterior Descending:  The left anterior descending essentially     bifurcated in its midportion at the level of the second diagonal vessel.     In the left anterior descending just beyond the bifurcation with the     second diagonal there was a 90% focal  stenosis.  The first smaller     diagonal vessels were free of significant disease, but would be too small     for bypass grafting.  The second large diagonal had a 50-70% narrowing in     its ostium, and would be suitable for grafting.   1. Left Circumflex:  The left circumflex  had a 30-40% narrowing proximally     with some irregularity at that point.  The distal vessel was reasonably     large and free of significant disease.  There did not appear to be a     significant degree of obstructive disease present in this proximal     segment of the left circumflex.   1. Right Coronary Artery:  The right coronary artery is a large vessel that     fills in a retrograde fashion from the left coronary system.  It is     totally occluded in its midportion  just after the  first right     ventricular branch.  It is a somewhat flat, abrupt occlusion noted.  The     retrograde fill from the left system did extend backwards to     approximately 2 cm of the area of the occlusion as noted.   1. Left Ventricular Angiogram:  Left ventricular angiogram was performed in     the RAO position.  Overall cardiac size and silhouette are basically     normal.  There is anterior as well as mild inferior hypokinesis.  The     global ejection fraction is estimated to be 40%.  There is no significant     mitral regurgitation noted.   DESCRIPTION OF ANGIOPLASTY PROCEDURE:  We turned our attention to attempts  to recannulate the right coronary artery.  The guide catheter provided  satisfactory backup.  We initially started with a soft guidewire and  proceeded on up to the use of the [employer] guidewire.  We were  unable to find the distal lumen in spite of considerable probing.  It was  felt that this probably was the result of an intimal flap and  that we would  be unsuccessful, and the procedure was aborted.   OVERALL IMPRESSION:  1. Moderate left ventricular dysfunction with anterior hypokinesis and  mild     inferior hypokinesis.  2. Totally occluded right coronary artery with left-to-right collaterals     with unsuccessful attempt at recanalization.  3. Severe stenosis in the mid left anterior descending at a point of     bifurcation with the left anterior descending and second diagonal branch.  4. Mild atherosclerosis of the left circumflex system.   DISCUSSION:  In light of the degree of left ventricular dysfunction and the  large area involved with the right coronary artery distribution, as well as  the complex bifurcation lesion in the left anterior descending it is felt  that Mr. Olafson needs to be referred for coronary artery bypass grafting.                                               Colleen Can. Deborah Chalk, M.D.    SNT/MEDQ  D:  12/31/2003  T:  01/01/2004  Job:  [id number]

## 2011-03-24 NOTE — Consult Note (Signed)
NAME:  MARGUES, FILIPPINI NO.:  [id number]   MEDICAL RECORD NO.:  [id number]          PATIENT TYPE:  INP   LOCATION:  5150                         FACILITY:  [facility]   PHYSICIAN:  Iva Boop, M.D. LHCDATE OF BIRTH:  Apr 11, 1927   DATE OF CONSULTATION:  08/10/2004  DATE OF DISCHARGE:                                   CONSULTATION   CONSULTING PHYSICIAN:  Iva Boop, M.D. [facility]   REFERRING PHYSICIAN:  Geoffry Paradise, M.D.   CHIEF COMPLAINT:  Passing blood, left abdominal pain, nausea and vomiting.   HISTORY OF PRESENT ILLNESS:  This is a pleasant 77 year old white man who is  cared for by Dr. Jacky Kindle.  He had the acute onset of crampy abdominal pain,  sweats, chills, nausea, vomiting and diarrhea. This started about noon  yesterday shortly after eating some potato chips and drinking some water.  Throughout the years, he has had some intermittent spasms and similar pain,  but this is much more severe.  He had fairly persistent diarrhea overnight  and then ended up seeing blood in the commode.  He presented to Dr. Jacky Kindle  and was sent to the emergency department where we have evaluated him and  admitted him to the hospital.  He has never had problems like this before  though he does have some episodic constipation and abdominal discomfort and  then multiple bowel movements with relief once or twice a month.  Previous  flexible sigmoidoscopy, history of diverticulosis or diverticulitis.   PAST MEDICAL HISTORY:  1.  Coronary artery bypass grafting in March of 2005; Dr. Deborah Chalk is his      cardiologist.  2.  Myocardial infarction in 1999 with angioplasty.  3.  Right lung nodule followed by Dr. Deborah Chalk.  4.  He may have reflux disease.  5.  Dyslipidemia.  6.  Atrial fibrillation in the postoperative setting after his coronary      artery bypass grafting.   ALLERGIES:  1.  PENICILLIN.  2.  MYCINS.   MEDICATIONS AT HOME:  1.  Plavix 75 mg daily.  2.   Aspirin 81 mg daily.  3.  Proscar 5 mg daily.  4.  Lipitor 10 mg daily.   ADDITIONAL MEDICAL HISTORY:  1.  Benign prostatic hypertrophy.   REVIEW OF SYMPTOMS:  HEME:  He had some anemia after his coronary artery  bypass grafting.  He had some transient thrombocytopenia and a mild  elevation in his INR back in March.  GI:  No weight loss and no appetite  loss.  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  He denies any cold medication use other than the Afrin.  He was a  little weak.  No pre-syncope. All other systems appear negative.   PAST SURGICAL HISTORY:  1.  Hydrocele repair.  2.  Rotator cuff repair on the left and right, twice on the left.  3.  Chronic sinus congestion on Afrin.  4.  Status post cataract excision and lens implant.  5.  History of community acquired pneumonia.   SOCIAL  HISTORY:  [place] lives [place] in [place]  He  works part time as a Curator.  Three cigars a day.  No alcohol.   FAMILY HISTORY:  Mother had diabetes and congestive heart failure.  His  father had lung cancer.   PHYSICAL EXAMINATION:  GENERAL:  Physical examination reveals a pleasant,  well developed, elderly white man.  VITAL SIGNS:  Blood pressure is 154/68, pulse 59 and regular, respirations  20, temperature 97.8.  EYES:  Anicteric.  MOUTH:  Posterior pharynx is free of lesions.  NECK:  Supple with no masses or thyromegaly.  CHEST:  Clear.  HEART:  S1 and S2 with no gallops.  ABDOMEN: Soft and tender in the left lower quadrant and left upper and mid  quadrant.  There is some rebound there.  This is focal.  Bowel sounds are  present.  RECTAL EXAM: This showed blood on the glove of my physician assistant.  EXTREMITIES:  Without cyanosis, clubbing or edema.  NEUROLOGIC:  He is alert and oriented times three.  There is no tremor.  LYMPH NODES:  No neck, supraclavicular or inguinal adenopathy palpated.   LABORATORY DATA:  White count  17.2 with a left shift, hemoglobin 13.1.  B-  MET normal.  Liver function tests are normal.   ASSESSMENT:  1.  This looks like acute colitis. We have ordered a CT scan and that has      been completed.  It demonstrates a segmental colitis on the left and the      overall clinical scenario is most compatible with ischemic colitis.  2.  The patient has coronary artery disease recently status post coronary      artery bypass grafting.  He does not have symptoms of that at this      point.  3.  He is a chronic Afrin user which potentially could have attributed to      ischemic colitis by inducing vasospasm, I believe.   RECOMMENDATIONS AND PLAN:  1.  Supportive care with hydration and analgesia.  2.  If he develops a fever or worsening, antibiotics might be needed, but I      do not think so at this point.  3.  Eventually he needs a colonoscopy. Assuming he continues to improve, I      would wait and defer that to an elective basis.  4.  While he is in the hospital, we will continue his Proscar for his benign      prostatic hypertrophy and his Lipitor for his dyslipidemia.  We will      hold his Plavix and aspirin because of the bleeding problems.   I appreciate the opportunity to care for this patient.       CEG/MEDQ  D:  08/10/2004  T:  08/10/2004  Job:  [id number]   cc:   Geoffry Paradise, M.D.  [address]  [place]  [place] [address]  Fax: [phone]   Colleen Can. Deborah Chalk, M.D.  Fax: [phone]

## 2011-03-24 NOTE — H&P (Signed)
[place]  Patient:    SLATON, REASER Visit Number: [id number] MRN: [id number]          Service Type: CAT Location: [facility] 28[address] 13 Attending Physician:  Eleanora Neighbor Dictated by:   Jennet Maduro Earl Gala, R.N., A.N.P.-C. Admit Date:  03/18/2002 Discharge Date: 03/18/2002                           History and Physical  CHIEF COMPLAINT: None.  HISTORY OF PRESENT ILLNESS: Mr. Ibarra is a 77 year old white male, who has known coronary disease.  He was referred on for routine stress Cardiolite testing toward the latter part of April 2003.  He exercised on the standard Bruce protocol for a total of nine minutes and 30 seconds to a maximum of 4.2 miles per hour at 16% grade.  His heart rate increased to 133 and blood pressure rose to 170/80.  The test was stopped due to fatigue and there were no complaints of angina.  His resting electrocardiogram was abnormal with inferolateral ST changes.  He had frequent PVCs immediately in recovery.  He had no dramatic ST-T wave changes that would suggest ischemia.  His imaging showed reduced ejection fraction with lateral hypokinesia, which is felt to be consistent with lateral ischemia, and he is now referred on for elective coronary angiography.  From a cardiac standpoint he has done well, with no complaints of chest pain, shortness of breath, light headedness, or dizziness.  PAST MEDICAL HISTORY:  1. Previous myocardial infarction, inferolateral, in October 1991.  He has     had angioplasty to the left circumflex in October 1991, angioplasty to the     LAD in December 1991, and angioplasty again to the left circumflex in     1992.  Last catheterization was in July 1992.  2. Prior tobacco abuse.  3. Hypercholesterolemia.  4. Previous rotator cuff surgery.  5. Remote history of pneumonia.  ALLERGIES:  1. PENICILLIN.  2. SOME MYCIN DRUGS.  CURRENT MEDICATIONS:  1. Aspirin q.d.  2. Proscar q.d.  3. Lipitor 10 mg q.d.  4. Bextra q.d.  5. Prozac 20 mg q.d.  FAMILY HISTORY: The patients parents both died in adult life, the exact cause of their deaths unknown.  He is an only child.  SOCIAL HISTORY: He is married.  There has been occasional cigar use.  He uses alcohol sparingly.  REVIEW OF SYSTEMS: Basically as noted above, and is otherwise unremarkable. He has had no complaints of chest pain.  He remains active with work.  He has had no shortness of breath, no light headedness, no dizziness, and no frank syncope.  PHYSICAL EXAMINATION:  GENERAL: On examination he is a very pleasant white male, who appears younger than his stated age.  VITAL SIGNS: Blood pressure is 140/70 sitting, 110/70 standing.  Heart rate 64 and regular.  Respirations 18.  He is afebrile.  Weight is 146 pounds.  SKIN: Warm and dry.  Color is unremarkable.  LUNGS: Clear.  HEART: Regular rhythm.  ABDOMEN: Soft.  Positive bowel sounds.  Nontender.  EXTREMITIES: Without edema.  NEUROLOGIC: Intact.  No gross focal deficits.  LABORATORY DATA: Pending.  IMPRESSION:  1. Abnormal stress Cardiolite study.  2. Known coronary disease with previous inferolateral myocardial infarction     in 1991, with prior revascularization.  3. Hypercholesterolemia.  PLAN: Will proceed on with elective coronary angiography.  The procedure has been discussed in full detail  with both him and his wife, and he is willing to proceed. Dictated by:   Jennet Maduro Earl Gala, R.N., A.N.P.-C. Attending Physician:  Eleanora Neighbor DD:  03/17/02 TD:  03/18/02 Job: [id number]

## 2011-03-24 NOTE — Discharge Summary (Signed)
NAME:  Kirk [name], OVERBECK NO.:  [id number]   MEDICAL RECORD NO.:  [id number]          PATIENT TYPE:  INP   LOCATION:  5150                         FACILITY:  [facility]   PHYSICIAN:  Iva Boop, M.D. LHCDATE OF BIRTH:  1927/07/29   DATE OF ADMISSION:  08/10/2004  DATE OF DISCHARGE:  08/12/2004                                 DISCHARGE SUMMARY   ADMISSION DIAGNOSES:  1.  Acute colitis, rule out ischemic colitis.  2.  Coronary artery disease status post coronary artery bypass grafting      March 2005, followed by Dr. Deborah Chalk.  Myocardial infarction in 1999 at      which time he underwent angioplasty.  3.  History of right lung nodule followed by Dr. Deborah Chalk.  4.  Question of gastroesophageal reflux disease.  5.  Dyslipidemia.  6.  Postoperative atrial fibrillation March 2005.  7.  Benign prostatic hypertrophy.  8.  Status post hydrocele repair.  9.  Status post rotator cuff repair, twice on the left, once on the right.  10. Chronic sinus congestion on Afrin.  11. Status post cataract excision and lens implant.  12. History of community acquired pneumonia.  13. History of diverticulitis.  14. History of perioperative thrombocytopenia March 2005.   DISCHARGE DIAGNOSES:  1.  Acute segmental colitis, probably ischemic in etiology.  Will continue a      brief course of outpatient antibiotics secondary to persistent      leukocytosis.  2.  Stable right hepatic lobe lesion which is likely a cyst.  3.  Common bile duct dilated to 11 mm.  No evidence for mass.  Will plan to      follow.  4.  Mild thrombocytopenia.  5.  Mild hyperglycemia with glucoses ranging as high as 134 during this      admission.  6.  Rhinitis medicamentosa.  7.  History of diverticulitis.  Question of diverticulitis raised by imaging      on CT, but doubt that patient has diverticulitis.  8.  Questionable irritable bowel syndrome.  The patient cycles between      intermittent constipation  followed by diarrhea associated with      gastrointestinal distress.   ALLERGIES:  1.  PENICILLIN.  2.  MYCINS.   BRIEF HISTORY:  Mr. Chiappetta is a pleasant 77 year old white male who is high  functioning.  The only prior GI studies that he has had were flex sigs in  the past.  He does carry a diagnosis of having had diverticulitis at some  point in the past, but he has never had any GI bleeding.  On the day prior  to admission, he developed nausea, vomiting, and then diarrhea.  This  progressed to the point where he was passing blood and pain worsened.  He  had chills, sweats, and temperature up to 100.1.  This is on top of  generally episodic constipation associated with abdominal discomfort and  cathartic diarrhea.  However, he had never had any bleeding.  He denied  weight loss or any chronic upper GI symptoms suggestive of reflux.  He does  take aspirin and Plavix every day.  He did state that he often eats at the  [employer], but his wife, who accompanies him when they eat out, has not  been sick.   The patient was evaluated and then admitted to Dr. Izola Price service  from the emergency room where he arrived in stable condition.  He was  afebrile.   LABORATORY DATA:  Hemoglobin 13.1, hematocrit 37.7, MCV 93.9, platelets  originally 181, but dropped to 142 during the admission.  WBC went from 17.2  to 13.7.  There was a left shift observed on differential.  PT 14.4, INR  1.2, and PTT of 34.  Sodium 139, potassium 4.1, glucose 134, BUN 21,  creatinine 1.0, albumin 3.5, total bilirubin 1.1, alkaline phosphatase 54,  AST 30, ALT 26.  CT scan showed an 11-mm dilated common bile duct, but no  evidence for mass or stricture.  Aorta was atheromatous.  Segmental wall  thickening within the descending and sigmoid colon was interpreted as  diverticulitis by radiologist.   HOSPITAL COURSE:  Mr. Groninger was admitted in stable condition to  nontelemetry bed.  On rectal exam, there  was fresh blood evident.  Tenderness was noted in the left and less so in the right lower and middle  abdomen.  He denied having guarding or rebound.  Labs returned showing  leukocytosis.  Dr. Arlyce Dice reviewed the CT scan and felt that the changes in  the sigmoid and descending colon represented segmental colitis and not  diverticulitis.  Initially, the patient was not treated with any  antibiotics.  However, he did have a persistent though declining  leukocytosis and Dr. Leone Payor elected to begin treatment with oral Cipro and  metronidazole.   The patient's hospital course was one of steady improvement.  The diarrhea  resolved as did the hematochezia.  Pain improved to the point where he was  nontender on abdominal exam.  Diet was slowly advanced from clear liquids up  to a low residue diet.  He was discharged to home in stable condition on  hospital day 3.  Followup appointment was with Dr. Stan Head on October  21 at 8:45.   During this admission, the patient complained significantly of chronic sinus  congestion for which he uses Afrin frequently.  Dr.  Leone Payor felt that the  patient was suffering from rhinitis medicamentosa and stopped the Afrin and  switched him over to [facility].  Also notably the Afrin can act as a trigger  for ischemic colitis.  It has been noted that this can occur in literature.  Therefore, this medication should not be restarted.   MEDICATIONS AT DISCHARGE:  1.  Plavix 75 mg daily to be restarted October 12.  2.  Aspirin 81 mg to be restarted October 12.  3.  Proscar 5 mg daily.  4.  Lipitor 10 mg daily.  5.  Cipro 500 mg b.i.d. for 5 days.  6.  Metronidazole 500 mg three times daily for 5 days.  7.  Flonase nasal spray one spray to each nostril twice daily.  8.  Saline nasal spray as needed.  9.  Vicodin 5/500 1/2 to 1 p.o. q.4h p.r.n. pain.   DIET:  Low fiber and low fat.  He was advised to avoid strenuous activity or heavy lifting of more than 15   pounds for the next 10 days.  He was advised to contact Dr. Marvell Fuller office  should he have any recurrent or new  GI problems.       SG/MEDQ  D:  09/21/2004  T:  09/21/2004  Job:  [id number]

## 2011-03-24 NOTE — H&P (Signed)
NAME:  Kirk [name], Kirk [name]                     ACCOUNT NO.:  [id number]   MEDICAL RECORD NO.:  [id number]                   PATIENT TYPE:  OIB   LOCATION:                                       FACILITY:  [facility]   PHYSICIAN:  Colleen Can. Deborah Chalk, M.D.            DATE OF BIRTH:  21-Oct-1927   DATE OF ADMISSION:  12/25/2003  DATE OF DISCHARGE:                                HISTORY & PHYSICAL   CHIEF COMPLAINT:  Previous  atypical chest pain, now in the setting of an  abnormal stress Cardiolite study.   HISTORY OF PRESENT ILLNESS:  Kirk [name] is a very pleasant 77 year old  white male who has multiple medical problems. He was seen in the office as a  work in appointment towards the latter part of January. At that time he was  complaining of increasing  weakness and fatigue. This subsequently began  after climbing out of a truck and injuring his left leg. He had significant  ecchymosis  as well as a knot that had appeared on his leg. This  subsequently resolved, but he did have calf swelling. Over the course of  that time frame, he began to feel more weak and draggy, as well as short of  breath. He was also complaining of palpitations as well as a chest burning  like sensation.   A CT scan of the chest was performed which was basically negative. There was  no evidence of pulmonary emboli. He did have a small nodule in the right  lung which will need follow up CT scan in approximately 3 months. He had  Holter monitoring performed which showed his overall rhythm to be sinus  bradycardia. There were occasional premature ventricular contractions with  one 12-beat run, one triplet and one pause which was 1.7 seconds. He was  seen back in follow up on December 07, 2003, and at that time was noted to be  improved. He had previously been started on Protonix, which may have  benefitted him. He had had no further  episodes of chest pain. He was  referred for stress Cardiolite study.   A stress  Cardiolite was performed on December 15, 2003. With that  he  demonstrated satisfactory exercise tolerance. His EKG is abnormal at rest  with worsening T-wave changes with exercise. There was also noted to be  inferior ischemia with well preserved LV function. He is now referred for  repeat cardiac catheterization.   PAST MEDICAL HISTORY:  1. Atherosclerotic cardiovascular disease with a previous  history of     inferolateral MI dating back to October 1991. He subsequently had     angioplasty to the left circumflex at this time and subsequently to the     LAD in 1991. He had angioplasty of the left circumflex again in July     1992. His last catheterization occurred in May of 2003, which showed mild     left ventricular  dysfunction and mild to moderate 3 vessel disease, 20%     left circumflex, 50% to 60% mid LAD, 40% to 50% segmental right.  2. Past history of tobacco use.  3. Hyperlipidemia.  4. Gastroesophageal reflux disease.  5. Palpitations with documented premature ventricular contractions per     Holter monitor.  6. Abnormal CT scan of the chest with small nodule in the right lung which     will need follow up in approximately 3 months.  7. History of rotator cuff surgery.  8. Remote history of pneumonia.   ALLERGIES:  PENICILLIN AS WELL AS SOME MYCIN DRUGS.   CURRENT MEDICATIONS:  1. Protonix 40 mg a day.  2. Bextra every other  day.  3. Lipitor 10 every day.  4. Proscar daily.  5. Ecotrin daily.   SOCIAL HISTORY:  He is married. There is occasional  cigar use.   FAMILY HISTORY:  Both of his parents died in their adult lives. The exact  cause was unknown. He is an only child.   REVIEW OF SYSTEMS:  Basically as noted above and is otherwise unremarkable.   PHYSICAL EXAMINATION:  GENERAL:  He is a very pleasant, white male who  appears younger than his stated age.  Weight 145-1/2 pounds.  VITAL SIGNS:  Blood pressure 150/70 sitting, 150/80 standing, heart rate 52,   respirations 18, afebrile.  SKIN:  Warm and dry. Color is unremarkable.  HEENT:  Unremarkable.  LUNGS:  Basically clear.  HEART:  Regular rhythm with occasional  ectopy.  ABDOMEN:  Soft, positive bowel sounds, nontender.  EXTREMITIES:  Without edema.   LABORATORY DATA:  Pertinent labs are pending.   IMPRESSION:  1. Previous episodes of weakness, fatigue, chest burning, now with an     abnormal stress Cardiolite study.  2. Known  coronary disease, remote history of myocardial infarction with     subsequent revascularization.  3. Hyperlipidemia.  4. Hypertension.   PLAN:  Will proceed on with elective cardiac catheterization. The procedure  was reviewed in full detail with both he and his wife and they are willing  to proceed on Friday, December 25, 2003.      Juanell Fairly C. Earl Gala, N.P.                 Colleen Can. Deborah Chalk, M.D.    LCO/MEDQ  D:  12/22/2003  T:  12/22/2003  Job:  [id number]   cc:   Geoffry Paradise, M.D.  [address]  [place]  [place] [address]  Fax: [phone]

## 2011-03-24 NOTE — Discharge Summary (Signed)
NAME:  Kirk [name], Kirk [name] NO.:  [id number]   MEDICAL RECORD NO.:  [id number]          PATIENT TYPE:  INP   LOCATION:  0369                         FACILITY[facility]  [facility]   PHYSICIAN:  Velora Heckler, MD      DATE OF BIRTH:  Jan 15, 1927   DATE OF ADMISSION:  01/24/2005  DATE OF DISCHARGE:  01/30/2005                                 DISCHARGE SUMMARY   REASON FOR ADMISSION:  Sigmoid diverticular disease, ischemic colitis.   HISTORY OF PRESENT ILLNESS:  The patient is a 77 year old white male  referred by Dr. Stan Head for a long standing history of diverticular  disease. The patient has had recurrent episodes of sigmoid diverticulitis.  He has had an episode of lower gastrointestinal bleeding. He was recently  admitted with an episode of ischemic colitis. The patient has undergone  colonoscopy. He now comes to surgery for sigmoid colectomy.   HOSPITAL COURSE:  The patient was admitted on January 24, 2005 after a bowel  prep at home. He underwent sigmoid resection in the operating room without  complication. Postoperative course was relatively straight forward. He  received 48 hours of intravenous antibiotics. He was started on sips of  clear liquids on the second hospital day. He was advanced to a regular diet  by the 4th postoperative day. Pathology revealed diverticular disease  without evidence of malignancy. The patient had gradual resolution of his  ileus and was prepared for discharge home on the 6th postoperative day.   DISCHARGE PLANNING:  The patient is discharged home today, January 30, 2005 in  good condition, tolerating a regular diet, and ambulating independently.   FOLLOW UP:  The patient will be seen back in my office at [facility] in 3 days for wound check and staple removal.   DISCHARGE MEDICATIONS:  Darvocet as needed for pain.   FINAL DIAGNOSIS:  Sigmoid diverticulitis, no evidence of malignancy.   CONDITION ON DISCHARGE:  Good.      TMG/MEDQ  D:  01/30/2005  T:  01/30/2005  Job:  [id number]   cc:   Iva Boop, M.D. [facility]   Geoffry Paradise, M.D.  [address]  [place]  [place] [address]  Fax: [phone]   Colleen Can. Deborah Chalk, M.D.  Fax: [phone]

## 2011-03-24 NOTE — Discharge Summary (Signed)
[facility]  Patient:    Kirk [name], Kirk [name]                  MRN: [id number] Adm. Date:  [id number] Disc. Date: [id number] Attending:  Montez Morita, Philips John Dictator:   Druscilla Brownie. Shela Nevin, P.A. CC:         Colleen Can. Deborah Chalk, M.D.  Radene Knee., M.D.  Richard A. Jacky Kindle, M.D.   Discharge Summary  ADMISSION DIAGNOSES: 1. Torn right rotator cuff. 2. History of myocardial infarction, October 1991. 3. Coronary artery disease. 4. History of prostatitis, chronic bronchitis, diverticulitis, allergic    rhinitis.  DISCHARGE DIAGNOSES: 1. Torn right rotator cuff. 2. History of myocardial infarction, October 1991. 3. Coronary artery disease. 4. History of prostatitis, chronic bronchitis, diverticulitis, allergic    rhinitis.  OPERATIONS PERFORMED: On July 27, 2000, the patient underwent right shoulder: 1. Distal clavicle resection. 2. Anterior acromioplasty. 3. Repair of rotator cuff. 4. Porcine graft to rotator cuff.  ASSISTANT:  Alvera Novel, P.A.  BRIEF HISTORY:  This 77 year old male has been seen by [place] for right shoulder pain not responsive to conservative care.  MRI showed positive full-thickness tear, AC degenerative changes.  Due to the fact that the patient did not improve from conservative care decided he would be a good candidate for the above procedures.  Admitted for same.  HOSPITAL COURSE:  The patient tolerated the surgical procedures quite well. Neurovascular remained intact to the right upper extremity.  It was felt that he could be maintained in his home environment.  Arrangements were made for discharge.  DISCHARGE MEDICATIONS: 1. He is to resume his home medications. 2. Skelaxin 400 mg one q.6-8h. p.r.n. 3. Percocet 5 mg one to two q.4-6h. p.r.n. pain  DISCHARGE FOLLOWUP:  Return to the center two weeks after surgery to see Dr. Montez Morita.  He is to follow up with his medical doctors for further  instructions.  DISCHARGE INSTRUCTIONS:  No range of motion of the shoulder.  LABORATORY AND X-RAY DATA:  CBC with differential completely within normal limits, urinalysis negative, and the blood chemistries were also normal.  No electrocardiogram or EKG seen on this chart.  CONDITION ON DISCHARGE:  Improved, stable.  DISPOSITION:  The patient is discharged to his home in the care of his family with the above plan. DD:  08/07/00 TD:  08/07/00 Job: [id number]

## 2011-03-24 NOTE — Op Note (Signed)
NAME:  Kirk [name], Kirk [name] NO.:  [id number]   MEDICAL RECORD NO.:  [id number]          PATIENT TYPE:  INP   LOCATION:  0002                         [facility]AC[facility]   PHYSICIAN:  Velora Heckler, MD      DATE OF BIRTH:  1927/10/26   DATE OF PROCEDURE:  01/24/2005  DATE OF DISCHARGE:                                 OPERATIVE REPORT   PREOPERATIVE DIAGNOSES:  Sigmoid diverticular disease, ischemic colitis.   POSTOPERATIVE DIAGNOSES:  Sigmoid diverticular disease, ischemic colitis.   PROCEDURE:  Sigmoid colectomy.   SURGEON:  Velora Heckler, MD   ASSISTANT:  Vikki Ports, MD   ANESTHESIA:  General per Dr. Eilene Ghazi.   ESTIMATED BLOOD LOSS:  150 mL.   PREPARATION:  Betadine.   COMPLICATIONS:  None.   INDICATIONS:  The patient is a 77 year old white male referred by Dr. Stan Head for longstanding history of diverticular disease. The patient has  had recurrent episodes of sigmoid diverticulitis. He had had a previous  episode of lower GI bleeding, presumably from the sigmoid colon. He had also  been admitted with an episode of ischemic colitis. The patient had developed  intermittent left lower quadrant abdominal pain and occasional functional  obstruction. He is now referred for sigmoid colectomy for treatment of  diverticular disease.   DESCRIPTION OF PROCEDURE:  The procedure was done in OR #1 at the [facility]. The patient is brought to the operating room,  placed in supine position on the operating room table. Following the  administration of general anesthesia, the patient is prepped and draped in  the usual strict aseptic fashion. After ascertaining that an adequate level  of anesthesia had been obtained, a midline abdominal incision is made with a  #10 blade. Dissection was carried down through subcutaneous tissues. The  fascia is incised in the midline and the peritoneal cavity is entered  cautiously. The fascial  incision was extended from the pubic tubercle to  just above the level of the umbilicus. There is a small umbilical hernia  which was opened and incorporated into the wound. The abdomen is explored.  The small bowel appears normal. In the pelvis, there is an obvious dense  inflammatory mass involving the mid to distal sigmoid colon. It is densely  adherent on the left pelvic sidewall. The Balfour retractor is placed for  exposure. The small bowel is packed cephalad. Dissection is begun along the  lateral aspect of the sigmoid mesentery. Sharp dissection is performed  allowing for mobilization of the sigmoid colon. Care is taken to avoid the  gonadal vessels. Care is also taken to avoid the left ureter which is  identified and preserved. Dissection is carried into the pelvis. Pelvic  peritoneum is opened and the entire sigmoid colon is mobilized off the left  pelvic sidewall. A point in the distal descending colon is selected and  transected between bowel clamps. Mesentery is taken down between [facility]  clamps, divided and ligated with 2-0 silk ties and 2-0 silk suture  ligatures. Dissection is carried distally to the  proximal rectum. Stay  sutures are placed in the rectum and the rectum is then transected below a  Satinsky clamp. Specimen is passed off the field and submitted to pathology  for review. Pathologist feels there are only changes consistent with  diverticulitis. No evidence of neoplasm is identified. Next an end to end  anastomosis is created between the distal descending colon and the proximal  rectum. This was performed in a single layer anastomosis with interrupted 3-  0 silk sutures. Anastomosis went well and there is no tension on the  anastomosis at the conclusion of the of this portion of the procedure.  Abdomen was irrigated copiously with warm saline which was evacuated.  Anastomosis was treated circumferentially with Tisseel. 5 mL are applied.  Abdomen is inspected again  for hemostasis and all fluid evacuated. Packs are  removed and Balfour retractor is removed. Omentum is placed over the  anastomosis. Abdominal wall was closed with a running #1 Novofil suture.  Subcutaneous tissues were closed with interrupted 3-0 Vicryl sutures. Skin  is closed with stainless steel staples. Sterile dressings are applied. The  patient is awakened from anesthesia and brought to the recovery room in  stable condition. The patient tolerated the procedure well.      TMG/MEDQ  D:  01/24/2005  T:  01/24/2005  Job:  [id number]   cc:   Iva Boop, M.D. [facility]   Geoffry Paradise, M.D.  [address]  [place]  [place] [address]  Fax: [phone]   Colleen Can. Deborah Chalk, M.D.  Fax: [phone]

## 2011-03-24 NOTE — Discharge Summary (Signed)
NAME:  JONANTHONY, NAHAR                     ACCOUNT NO.:  [id number]   MEDICAL RECORD NO.:  [id number]                   PATIENT TYPE:  OIB   LOCATION:  2004                                 FACILITY:  [facility]   PHYSICIAN:  Colleen Can. Deborah Chalk, M.D.            DATE OF BIRTH:  07-09-1927   DATE OF ADMISSION:  12/28/2003  DATE OF DISCHARGE:  12/29/2003                                 DISCHARGE SUMMARY   PRIMARY DISCHARGE DIAGNOSIS:  Previous atypical chest pain with an abnormal  stress Cardiolite study, with subsequent failed percutaneous coronary  intervention, to be referred for coronary artery bypass grafting.   SECONDARY DISCHARGE DIAGNOSES:  1. Atherosclerotic cardiovascular disease, previous history of inferolateral     myocardial infarction dating back to October 1991, with previous     angioplasty to the left circumflex and subsequently to the left anterior     descending in 1991.  He has had repeat angioplasty to the left circumflex     in July 1992 with previous catheterization in May 2003.  2. Past tobacco abuse.  3. Hyperlipidemia.  4. Gastroesophageal reflux disease.  5. Palpitations with documented PVCs, bradycardia per recent Holter monitor.  6. Abnormal CT scan of the chest with a small nodule on the right lung,     which will need follow-up in approximately three month.   HISTORY OF PRESENT ILLNESS:  The patient is a very pleasant 77 year old male  who has multiple medical problems.  He was seen as a work-in appointment  toward the latter part of January with complaints of increasing weakness and  fatigue.  He had had some atypical chest pain.  This subsequently resolved  with Protonix.  He had extensive evaluation at that time, which included a  CT scan of the chest for shortness of breath that was basically negative  except for a small right lung nodule.  He had Holter monitoring which showed  sinus bradycardia with PVCs, one 12-beat run, one triplet, and one pause  of  1.7 seconds.  By the end of January his symptoms had improved with Protonix.  He was referred on, however, for a stress Cardiolite study, which was  performed on February 8.  With this his EKG, which was abnormal at rest,  showed worsening T-wave changes with exercise.  There was also inferior  ischemia.  He was subsequently referred for repeat cardiac catheterization.   LABORATORY DATA:  On admission, chemistries were normal.  PT and PTT were  unremarkable.  CBC was normal.   HOSPITAL COURSE:  The patient was admitted electively on December 28, 2003.  He underwent cardiac catheterization per Dr. Roger Shelter.  That  procedure was tolerated well without any known complications.  LV function  demonstrated anterior hypokinesis with ejection fraction estimated at 40%.  The right coronary is occluded.  There were left-to-right collaterals.  The  left main had mild distal tapering.  The left circumflex has 30-40% proximal  narrowings with calcification.  The LAD had a 20-40% proximal narrowing with  an 80% narrowing after the second diagonal.  There is a 60-70% stenosis in  the second diagonal vessel.  Angioplasty was attempted to the right coronary  artery with multiple guidewires used, but the lesion was not able to be  crossed.  It was felt that the patient was suffering from two-vessel  coronary disease with LV dysfunction with unsuccessful attempts at PCI and  now will be referred on for coronary artery bypass grafting.   The patient was monitored overnight and today on December 29, 2003, he is  feeling well without complaints.  Groin is unremarkable.  Post CK-MB is 4.9.  His chemistries are normal.  He subsequently will be referred for coronary  artery bypass grafting and hopefully be discharged later on today.   CONDITION ON DISCHARGE:  Stable.   DISCHARGE MEDICATIONS:  Will resume all of his previous medicines with no  changes made at this point in time.   He will have  follow-up appointments arranged with [facility] prior to discharge.  He is to call our office for any problems in the interim.      Juanell Fairly C. Earl Gala, N.P.                 Colleen Can. Deborah Chalk, M.D.    LCO/MEDQ  D:  12/29/2003  T:  12/29/2003  Job:  [id number]   cc:   Geoffry Paradise, M.D.  [address]  [place]  [place] [address]  Fax: [phone]   [facility]

## 2011-03-24 NOTE — Op Note (Signed)
NAME:  Kirk [name], Kirk [name] NO.:  [id number]   MEDICAL RECORD NO.:  [id number]                   PATIENT TYPE:  INP   LOCATION:  2307                                 FACILITY:  [facility]   PHYSICIAN:  Evelene Croon, M.D.                  DATE OF BIRTH:  1926/11/17   DATE OF PROCEDURE:  01/11/2004  DATE OF DISCHARGE:                                 OPERATIVE REPORT   PREOPERATIVE DIAGNOSIS:  Severe three-vessel coronary artery disease.   POSTOPERATIVE DIAGNOSIS:  Severe three-vessel coronary artery disease.   OPERATIVE PROCEDURES:  1. Median sternotomy, extracorporeal circulation, coronary artery bypass     graft surgery x4 using a left internal mammary artery to the left     anterior descending coronary artery, with a saphenous vein graft to the     diagonal branch of the left anterior descending artery, a saphenous vein     graft to the obtuse marginal branch of the left circumflex coronary     artery, and a saphenous vein graft to the posterior descending branch of     the right coronary artery.  2. Endoscopic vein harvesting from both thighs.   ATTENDING SURGEON:  Evelene Croon, M.D.   ASSISTANT:  Jerold Coombe, P.A.   ANESTHESIA:  General endotracheal.   CLINICAL HISTORY:  This patient is a 77 year old gentleman with a history of  coronary disease, status post inferolateral MI in October 1991 and status  post angioplasty of the left circumflex and subsequently the LAD in 1991.  He subsequently underwent repeat angioplasty of the left circumflex in July  1992.  He presents with a several-month history of fatigue and burning chest  pain.  A stress Cardiolite exam showed worsening T-wave changes with  exercise and inferior ischemia with well-preserved left ventricular  function.  He subsequently underwent cardiac catheterization on December 28, 2003, which showed an 80% mid-LAD stenosis after the second diagonal branch.  The second diagonal itself  was a small vessel that had 60-70% ostial  stenosis.  The left circumflex was calcified proximally with 30-40%  stenosis.  The right coronary artery was occluded in its midportion with  filling of a large distal vessel by collaterals from the LAD.  Attempted  angioplasty of the right coronary artery was unsuccessful.  After review of  the angiogram and examination of the patient, it was felt that coronary  artery bypass graft surgery was the best treatment to prevent further  ischemia and infarction and improve his quality of life.  I discussed the  operative procedure with the patient and his wife including alternatives,  benefits, and risks, including bleeding, blood transfusion, infection,  stroke, myocardial infarction, graft failure, and death.  They understood  and agreed to proceed.   OPERATIVE PROCEDURE:  The patient was taken to the operating room and placed  on the table in supine position.  After induction  of general endotracheal  anesthesia, a Foley catheter was placed in the bladder using sterile  technique.  Then the chest, abdomen, and both lower extremities were prepped  and draped in the usual sterile manner.  The chest was entered through a  median sternotomy incision and the pericardium opened in the midline.  Examination of the heart showed good ventricular contractility.  The  ascending aorta had no palpable plaques in it.   Then the left internal mammary artery was harvested from the chest wall as a  pedicle graft.  This was a medium-caliber vessel with excellent blood flow  through it.  At the same time a segment of greater saphenous vein was  harvested from the right thigh using endoscopic vein harvest technique.  This vein was of medium size and good quality.  Just below the knee level  this vein bifurcated into two small veins, and therefore we had to harvest  another section of saphenous vein from the left thigh using endoscopic vein  harvest technique.  This  vein was also of medium size and good quality.   Then the patient was heparinized and when an adequate activated clotting  time was achieved, the distal ascending aorta was cannulated using a 20  French aortic cannula for arterial inflow.  Venous outflow was achieved  using a two-stage venous cannula for the right atrial appendage.  An  antegrade cardioplegia and vent cannula was inserted in the aortic root.   The patient was placed on cardiopulmonary bypass and the distal coronary  arteries identified.  The LAD was a large, graftable vessel.  The diagonal  was small, but it was felt to be graftable.  The posterior descending was a  large, graftable vessel.  There was scar throughout the inferolateral wall.  The obtuse marginal was a large, graftable vessel with mild distal disease  in it.   Then the aorta was crossclamped and 500 mL of cold blood antegrade  cardioplegia was administered in the aortic root with quick arrest of the  heart.  Systemic hypothermia to 20 degrees Centigrade and topical  hypothermia with iced saline was used.  A temperature probe was placed in  the septum and an insulating pad in the pericardium.   The first distal anastomosis was performed to the obtuse marginal branch.  The internal diameter of this vessel was about 2.5 mm.  The conduit used was  a segment of greater saphenous vein and the anastomosis performed in an end-  to-side manner using continuous 7-0 Prolene suture.  Flow was measured  through the graft and was excellent.   The second distal anastomosis was performed to the diagonal branch.  The  internal diameter was about 1.5 mm.  The conduit used was a second segment  of greater saphenous vein and the anastomosis performed in an end-to-side  manner using continuous 7-0 Prolene suture.  Flow was measured through the  graft and was excellent.  Then another dose of cardioplegia was given down the vein grafts and in the aortic root.   The third  distal anastomosis was performed to the posterior descending  coronary artery.  The internal diameter of this vessel was 1.75 mm.  The  conduit used was a third segment of greater saphenous vein and the  anastomosis performed in an end-to-side manner using continuous 7-0 Prolene  suture.  Flow was measured through the graft and was excellent.   The fourth distal anastomosis was performed to the midportion of the left  anterior descending coronary artery.  The internal diameter was about 2.5  mm.  The conduit used was the left internal mammary graft, and this was  brought through an opening in the left pericardium anterior to the phrenic  nerve.  It was anastomosed to the LAD in an end-to-side manner using  continuous 8-0 Prolene suture.  The pedicle was tacked to the epicardium  with 6-0 Prolene sutures.  The patient was rewarmed to 37 degrees Centigrade  and the clamp removed from the mammary pedicle.  There was rapid warming of  the ventricular septum and return of spontaneous ventricular fibrillation.  The patient was rewarmed to 37 degrees Centigrade.  The three proximal vein  graft anastomoses were performed with the crossclamp in place in an end-to-  side manner to the ascending aorta using continuous 6-0 Prolene suture.  The  crossclamp was removed with a time of 96 minutes.  There was spontaneous  conversion to sinus rhythm.   The proximal and distal anastomoses appeared hemostatic and the alignment of  the grafts satisfactory.  Graft markers were placed around the proximal  anastomoses.  Two temporary right ventricular and right atrial pacing wires  were placed and brought out through the skin.   When the patient had rewarmed to 37 degrees Centigrade, he was weaned from  cardiopulmonary bypass on no inotropic agents.  Total bypass time was 110  minutes.  Cardiac function appeared excellent with cardiac output of 4  L/min.  Protamine was given and the venous and aortic cannulas  were removed  without difficulty.  Hemostasis was achieved.  Three chest tubes were placed  with a tube in the posterior pericardium, one in the left pleural space, and  one in the anterior mediastinum.  The pericardium was reapproximated over  the heart.  The sternum was closed with #6 stainless steel wires.  The  fascia was closed with a continuous #1 Vicryl suture.  The subcutaneous  tissue was closed with continuous 2-0 Vicryl and the skin with 3-0 Vicryl  subcuticular closure.  The lower extremity vein harvest site was closed in  layers in a similar manner.  The sponge, needle, and instrument counts were  correct according to the scrub nurse.  Dry sterile dressings were applied  over the incisions and around the chest tubes, which were hooked to  Pleuravac suction.  The patient remained hemodynamically stable and was  transported to the SICU in guarded but stable condition.                                              Evelene Croon, M.D.    BB/MEDQ  D:  01/11/2004  T:  01/12/2004  Job:  [id number]   cc:   Colleen Can. Deborah Chalk, M.D.  Fax: [phone]   [facility]

## 2011-03-24 NOTE — Op Note (Signed)
[facility]  Patient:    Kirk [name], Kirk [name]                  MRN: [id number] Proc. Date: 07/27/00 Adm. Date:  [id number] Disc. Date: [id number] Attending:  Montez Morita, Philips John                           Operative Report  AGE:  77  PREOPERATIVE DIAGNOSIS:  Rotator cuff tear with acromioclavicular arthritis.  POSTOPERATIVE DIAGNOSIS:  Rotator cuff tear with acromioclavicular arthritis.  OPERATIVE PROCEDURE:  Distal clavicle excision, anterior acromioplasty, and rotator cuff repair with restore (deep layer of pigskin) onlay graft.  SURGEON:  Philips J. Montez Morita, M.D.  ASSISTANT:  Cherly Beach, P.A.  ANESTHESIA:  General.  DESCRIPTION OF PROCEDURE:  After suitable general anesthesia, he was positioned in the shoulder frame, prepped and draped routinely.  An incision was made over the distal clavicle in line with the anterior acromion, about an inch below.  The distal clavicle was freed up down to periosteum anterior-to-posterior and excised with the saw.  The tissues from the anterior acromion were removed and it was taken off with a saw and beveled posteriorly. We then followed the coracoacromial ligament down to the coracoid, and with a Bovie cut above the coracoid, so that in reattaching it, it would be much looser, and still have good tissue for reattachment.  At that tear, the posterior attachment was identified.  The tear was retracted.  There was a longitudinal split through the anterior fourth, and a split at the third of the supraspinatus, just in front of the biceps tendon, and then the major portion about an inch long, when the lower _______ was retracted superiorly and posteriorly.  A knife blade was used to freshen up the edges and #1 Tycron and #1 Ethibond, and two interrupted PDS #1s were used to repair the longitudinal rent, and to attach at the most anterior portion to more soft tissue on the side of the biceps groove.  An osteotome  was then used to cut about a little bigger than a 1 inch trough, about 3/4 of an inch proximal to the region of site of insertion and two drill holes were brought in on either side and used to pull the tendon back down nicely into that groove.  The restore patch was then placed over the suture defect over the implant, and then around to the remaining cuff tendon 3/4 of an inch distally and sutured with interrupted #1 PDS suture under tension.  A nice repair was felt to have been achieved and good coverage with the restore as well.  Two drill holes were placed in the acromion and four sutures were passed through that for reattachment of the deltoid and reattachment of the muscle tissue over the [facility] joint.  The end of the clavicle had been treated with bone wax and a piece of Gelfoam soaked in 0.5% Marcaine was placed in the interspace as part of the closure.  After closure with PDS of the anterior deltoid, the subcutaneous with 2-0, and then a running 4-0 ________ subcuticular suture with Steri-Strips.  In between the Steri-Strips with additional 0.5% Marcaine was p laced in the area.  Prior to the surgery, a scalene block had been performed for postoperative pain by anesthesia.  He was placed in an elbow sling and went to recovery in good condition. DD:  07/27/00 TD:  07/29/00 Job: [id number]

## 2011-04-07 ENCOUNTER — Other Ambulatory Visit: Payer: Self-pay | Admitting: *Deleted

## 2011-04-07 ENCOUNTER — Encounter: Payer: Self-pay | Admitting: Cardiology

## 2011-04-07 ENCOUNTER — Ambulatory Visit (INDEPENDENT_AMBULATORY_CARE_PROVIDER_SITE_OTHER): Payer: Medicare Other | Admitting: Cardiology

## 2011-04-07 VITALS — BP 144/62 | HR 66 | Ht 66.0 in | Wt 146.4 lb

## 2011-04-07 DIAGNOSIS — I2510 Atherosclerotic heart disease of native coronary artery without angina pectoris: Secondary | ICD-10-CM | POA: Insufficient documentation

## 2011-04-07 NOTE — Progress Notes (Signed)
Subjective:   Kirk [name] is seen today for followup visit. In general, he's doing reasonably well but still having a good bit of discomfort from his low back pain. However, he is able to stay active. He has not had recurrent anginal pain or significant shortness of breath. He has a history of gastroesophageal reflux disease as well as a history pancreatitis. He occasionally still has some GI distress. He's not having any lower extremity edema  Current Outpatient Prescriptions  Medication Sig Dispense Refill  . aspirin 81 MG tablet Take 81 mg by mouth daily.        . Diclofenac Sodium CR 100 MG 24 hr tablet Take 100 mg by mouth as needed.        . finasteride (PROSCAR) 5 MG tablet Take 5 mg by mouth daily.        Marland Kitchen HYDROcodone-acetaminophen (VICODIN) 5-500 MG per tablet Take 1 tablet by mouth as needed.        . hyoscyamine (ANASPAZ) 0.125 MG TBDP 1-2 dissolved under tongue every 4 hours as needed for abdominal pain  60 tablet  5  . lisinopril (PRINIVIL,ZESTRIL) 10 MG tablet Take 10 mg by mouth daily.        . pantoprazole (PROTONIX) 40 MG tablet 1 by mouth each day 30 minutes before meal as needed       . DISCONTD: loratadine (CLARITIN) 10 MG tablet Take 10 mg by mouth as needed.          Allergies  Allergen Reactions  . Penicillins   . Erythromycin Itching and Rash    Patient allergic to all Mycins     Patient Active Problem List  Diagnoses  . TRANSAMINASES, SERUM, AND ALKALINE PHOSPHATASE ELEVATED  . ALKALINE PHOSPHATASE, ELEVATED  . Dilated bile ducts, unclear significance  . Chronic epigastric pain    History  Smoking status  . Current Everyday Smoker -- 60 years  . Types: Cigars  Smokeless tobacco  . Never Used  Comment: 2-cigars a day    History  Alcohol Use  . Yes    Family History  Problem Relation Age of Onset  . Diabetes Mother   . Heart disease Mother   . Lung cancer Father   . Colon cancer Neg Hx     Review of Systems:   The patient denies any heat or cold  intolerance.  No weight gain or weight loss.  The patient denies headaches or blurry vision.  There is no cough or sputum production.  The patient denies dizziness.  There is no hematuria or hematochezia.  The patient denies any muscle aches or arthritis.  The patient denies any rash.  The patient denies frequent falling or instability.  There is no history of depression or anxiety.  All other systems were reviewed and are negative.   Physical Exam:   Weights 146. Blood pressure 144/62 sitting, heart rate 66 and regular.The head is normocephalic and atraumatic.  Pupils are equally round and reactive to light.  Sclerae nonicteric.  Conjunctiva is clear.  Oropharynx is unremarkable.  There's adequate oral airway.  Neck is supple there are no masses.  Thyroid is not enlarged.  There is no lymphadenopathy.  Lungs are clear.  Chest is symmetric.  Heart shows a regular rate and rhythm.  S1 and S2 are normal.  There is no murmur click or gallop.  Abdomen is soft normal bowel sounds.  There is no organomegaly.  Genital and rectal deferred.  Extremities are without edema.  Peripheral pulses are adequate.  Neurologically intact.  Full range of motion.  The patient is not depressed.  Skin is warm and dry.  Assessment / Plan:

## 2011-04-10 ENCOUNTER — Other Ambulatory Visit (INDEPENDENT_AMBULATORY_CARE_PROVIDER_SITE_OTHER): Payer: Medicare Other | Admitting: *Deleted

## 2011-04-10 DIAGNOSIS — E7800 Pure hypercholesterolemia, unspecified: Secondary | ICD-10-CM

## 2011-04-10 LAB — HEPATIC FUNCTION PANEL
ALT: 67 U/L — ABNORMAL HIGH (ref 0–53)
AST: 64 U/L — ABNORMAL HIGH (ref 0–37)
Albumin: 3.6 g/dL (ref 3.5–5.2)
Alkaline Phosphatase: 113 U/L (ref 39–117)
Bilirubin, Direct: 0.3 mg/dL (ref 0.0–0.3)
Total Bilirubin: 0.9 mg/dL (ref 0.3–1.2)
Total Protein: 6.5 g/dL (ref 6.0–8.3)

## 2011-04-10 LAB — BASIC METABOLIC PANEL
BUN: 18 mg/dL (ref 6–23)
CO2: 29 mEq/L (ref 19–32)
Calcium: 8.7 mg/dL (ref 8.4–10.5)
Creatinine, Ser: 0.8 mg/dL (ref 0.4–1.5)
Glucose, Bld: 113 mg/dL — ABNORMAL HIGH (ref 70–99)

## 2011-04-10 LAB — LIPID PANEL
HDL: 60.4 mg/dL (ref >39.00)
Total CHOL/HDL Ratio: 3

## 2011-04-12 ENCOUNTER — Telehealth: Payer: Self-pay | Admitting: *Deleted

## 2011-04-12 NOTE — Telephone Encounter (Signed)
Message copied by Lorayne Bender on Wed Apr 12, 2011 12:03 PM ------      Message from: Roger Shelter      Created: Wed Apr 12, 2011  8:43 AM       Ok, recheck in six months.

## 2011-04-14 ENCOUNTER — Telehealth: Payer: Self-pay | Admitting: *Deleted

## 2011-04-14 NOTE — Telephone Encounter (Signed)
Pt's wife Windell Moulding notified of lab results and for pt to have labs in six months.

## 2011-04-14 NOTE — Telephone Encounter (Signed)
Message copied by Adolphus Birchwood on Fri Apr 14, 2011  1:45 PM ------      Message from: Roger Shelter      Created: Wed Apr 12, 2011  8:43 AM       Ok, recheck in six months.

## 2011-08-14 LAB — BASIC METABOLIC PANEL
BUN: 6
CO2: 29
Calcium: 8.9
Chloride: 105
Creatinine, Ser: 0.72
GFR calc Af Amer: >60

## 2011-08-14 LAB — CBC
Hemoglobin: 11.9 — ABNORMAL LOW
MCHC: 34
MCV: 94.6
Platelets: 149 — ABNORMAL LOW
RBC: 3.37 — ABNORMAL LOW
RBC: 3.78 — ABNORMAL LOW
RDW: 13.2
RDW: 14.2

## 2011-08-14 LAB — COMPREHENSIVE METABOLIC PANEL
ALT: 111 — ABNORMAL HIGH
AST: 85 — ABNORMAL HIGH
Alkaline Phosphatase: 123 — ABNORMAL HIGH
CO2: 31
GFR calc Af Amer: >60
Glucose, Bld: 89
Potassium: 4.2
Sodium: 143
Total Protein: 6.2

## 2011-08-16 ENCOUNTER — Telehealth: Payer: Self-pay | Admitting: Cardiology

## 2011-08-16 NOTE — Telephone Encounter (Signed)
Pt's wife called. She said they went to his primary dr today . She had some questions about chf and whether to make appt to see dr Shirlee Latch He is a former Dr Deborah Chalk pt please call her back

## 2011-08-17 NOTE — Telephone Encounter (Signed)
I talked with pt's wife. Pt had gone to see his PCP  yesterday because of a terrible cough. Pt was told he had congestion in his lungs-bronchitis- and extra fluid. Pt was given medication and has a follow up appt with his PCP tomorrow. Pt was asked by his PCP to call and schedule an appt with his cardiologist. I have given pt an appt with Tereso Newcomer 08/24/11 11am (Dr Shirlee Latch will be in the office that day). I have told pt's wife if pt needs sooner appt after seeing his PCP tomorrow  to call me back and I will try to work that out. She was satisfied with this plan.

## 2011-08-24 ENCOUNTER — Ambulatory Visit (INDEPENDENT_AMBULATORY_CARE_PROVIDER_SITE_OTHER): Payer: Medicare Other | Admitting: Physician Assistant

## 2011-08-24 ENCOUNTER — Encounter: Payer: Self-pay | Admitting: Physician Assistant

## 2011-08-24 VITALS — BP 140/78 | HR 60 | Ht 66.5 in | Wt 148.0 lb

## 2011-08-24 DIAGNOSIS — I2510 Atherosclerotic heart disease of native coronary artery without angina pectoris: Secondary | ICD-10-CM

## 2011-08-24 DIAGNOSIS — I255 Ischemic cardiomyopathy: Secondary | ICD-10-CM | POA: Insufficient documentation

## 2011-08-24 DIAGNOSIS — I2589 Other forms of chronic ischemic heart disease: Secondary | ICD-10-CM

## 2011-08-24 DIAGNOSIS — I4891 Unspecified atrial fibrillation: Secondary | ICD-10-CM | POA: Insufficient documentation

## 2011-08-24 DIAGNOSIS — Z720 Tobacco use: Secondary | ICD-10-CM | POA: Insufficient documentation

## 2011-08-24 DIAGNOSIS — J069 Acute upper respiratory infection, unspecified: Secondary | ICD-10-CM | POA: Insufficient documentation

## 2011-08-24 DIAGNOSIS — E785 Hyperlipidemia, unspecified: Secondary | ICD-10-CM | POA: Insufficient documentation

## 2011-08-24 DIAGNOSIS — I10 Essential (primary) hypertension: Secondary | ICD-10-CM | POA: Insufficient documentation

## 2011-08-24 DIAGNOSIS — I5023 Acute on chronic systolic (congestive) heart failure: Secondary | ICD-10-CM

## 2011-08-24 LAB — BASIC METABOLIC PANEL
Calcium: 8.9 mg/dL (ref 8.4–10.5)
GFR: 101.22 mL/min (ref >60.00)
Potassium: 4.1 mEq/L (ref 3.5–5.1)
Sodium: 140 mEq/L (ref 135–145)

## 2011-08-24 LAB — BRAIN NATRIURETIC PEPTIDE: Pro B Natriuretic peptide (BNP): 392 pg/mL — ABNORMAL HIGH (ref 0.0–100.0)

## 2011-08-24 NOTE — Assessment & Plan Note (Signed)
We discussed the need for cessation. 

## 2011-08-24 NOTE — Assessment & Plan Note (Signed)
Borderline control.  Continue current therapy.  Monitor.

## 2011-08-24 NOTE — Assessment & Plan Note (Signed)
Resolving.  Follow up with PCP as directed.

## 2011-08-24 NOTE — Assessment & Plan Note (Signed)
No anginal symptoms.  Low risk myoview in 2010.  Continue ASA.  If EF much lower on echo, consider further ischemic evaluation.

## 2011-08-24 NOTE — Progress Notes (Addendum)
History of Present Illness: Primary Cardiologist:  Dr. Marca Ancona (previous patient of Dr. Deborah Chalk) PCP:  Dr. Sharlene Motts Kirk [name] Day is a 77 y.o. male presents for follow up on CHF.  He is a prior patient of Dr. Deborah Chalk.  He has CAD, is s/p MI in 1999 and underwent CABG in 2005.  He had post op AFib.  He also had AFib in the setting of post ERCP pancreatitis in 2011.  Coumadin was not indicated.  He has LV dysfunction.  EF was 40% at cath in 2005.  Last echo in 2011 demonstrated EF 40-45%.  Last myoview in 2010 was low risk.  Has had evidence of volume overload in the past and has been on lasix before.  He was taken off simvastatin a few years ago due to elevated LFTs and follows with GI (Dr. Leone Payor) for dilated common bile ducts.  His ERCP was inconclusive and complicated by pancreatitis and his LFTs have been followed.  His records indicate significant bradycardia in the past on beta blockers.    He developed a cough a couple weeks ago as well as DOE.  He was seen by his PCP and placed on antibiotics.  CXR demonstrated ? Edema vs infection.  He returned to the office several days later with persistent symptoms.  He was describing orthopnea.  He was placed on Lasix and his antibiotics were changed.  BNP was checked and it was elevated at 1068.  He was asked to follow up here.  He notes he is feeling much better.  He is sleeping better and denies any current orthopnea, PND.  He notes chronic ankle edema.  This is improved on the lasix.  He denies chest pain, palpitations, syncope or near syncope.  He is quite active and denies any significant DOE now (probably Class 2).  Cough is also improved.  Non productive.  No fevers.    Labs (08/16/11): Hgb 12.5, K 4.5, creatinine 0.8, AST 65, ALT 50, ALP 152, BNP 1068.  Past Medical History  Diagnosis Date  . GERD (gastroesophageal reflux disease)   . Diverticulosis     s/p partial colon resection  . CAD (coronary artery disease)     s/p CABG 2005  (L-LAD, S-Dx, S-OM1, S-PDA); patient had post op AFib;  Myoview 8/10: mild fixed lateral defect; no ischemia, EF 47%  . Hyperlipemia   . Ischemic cardiomyopathy     echo 1/11: mild MR, mild LAE, EF 40-45%  . Hypertension   . Arthritis   . Common bile duct dilatation   . Pancreatitis     post-ERCP 11/2009  . Colitis, ischemic   . Chronic systolic heart failure   . Atrial fibrillation     post CABG in 2005 and in setting of acute illness in 1/11 (post ERCP pancreatitis) - coumadin not felt to be indicated  . Lumbar spondylosis   . Elevated LFTs     statin d/c'd in past due to   . BPH (benign prostatic hyperplasia)   . Sinus bradycardia     on beta blockers    Current Outpatient Prescriptions  Medication Sig Dispense Refill  . aspirin 81 MG tablet Take 81 mg by mouth daily.        . Diclofenac Sodium CR 100 MG 24 hr tablet Take 100 mg by mouth as needed.        . finasteride (PROSCAR) 5 MG tablet Take 5 mg by mouth daily.        Marland Kitchen  furosemide (LASIX) 40 MG tablet Take 40 mg by mouth daily.        Marland Kitchen HYDROcodone-acetaminophen (VICODIN) 5-500 MG per tablet Take 1 tablet by mouth as needed.        . hyoscyamine (ANASPAZ) 0.125 MG TBDP 1-2 dissolved under tongue every 4 hours as needed for abdominal pain  60 tablet  5  . losartan (COZAAR) 50 MG tablet Take 50 mg by mouth daily.        . meclizine (ANTIVERT) 25 MG tablet Take 25 mg by mouth as needed.        . pantoprazole (PROTONIX) 40 MG tablet 1 by mouth each day 30 minutes before meal as needed         Allergies: Allergies  Allergen Reactions  . Penicillins   . Erythromycin Itching and Rash    Patient allergic to all Mycins    Social Hx:  Still smoking cigars.  Married.    ROS:  Please see the history of present illness.  All other systems reviewed and negative.   Vital Signs: BP 140/78  Pulse 60  Ht 5' 6.5" (1.689 m)  Wt 148 lb (67.132 kg)  BMI 23.53 kg/m2  PHYSICAL EXAM: Well nourished, well developed, in no acute  distress HEENT: normal Neck: no JVD Cardiac:  normal S1, S2; RRR; no murmur, no S3 Lungs:  clear to auscultation bilaterally, no wheezing, rhonchi or rales Abd: soft, nontender, no hepatomegaly Ext: trace to 1+ bilateral ankle edema Skin: warm and dry Neuro:  CNs 2-12 intact, no focal abnormalities noted Psych: normal affect  EKG:  NSR, HR 60, normal axis, NSSTTW changes, no significant change since prior tracing.  ASSESSMENT AND PLAN:

## 2011-08-24 NOTE — Assessment & Plan Note (Signed)
Symptoms much improved.  Suspect his URI contributed to fluid overload.  Continue current Lasix dosage.  Arrange follow up Echo to reassess LVF.  Check BMET to follow up on renal fxn and K+ and repeat BNP.  We had a long discussion regarding watching his salt intake and managing his fluids.  We also discussed weighing daily and contacting us if his weight increases significantly.  Plan follow up with Dr. Shirlee Latch in 3-4 weeks.

## 2011-08-24 NOTE — Assessment & Plan Note (Signed)
No statin due to elevated LFTs.  ? If low dose pravastatin could be considered in the future.  Will keep this in mind.

## 2011-08-24 NOTE — Assessment & Plan Note (Signed)
This was transient in the context of acute illnesses.  No coumadin has been indicated.  He has significant TE risk factors (CHADS2-VASc = 4).  If he has a recurrent episode that is unprovoked, he will require coumadin.

## 2011-08-24 NOTE — Assessment & Plan Note (Signed)
Check echo as noted.  Continue ARB.  Consider adding spironolactone at some point.  Avoid carvedilol for now given h/o bradycardia with beta blockers in the past.

## 2011-08-24 NOTE — Patient Instructions (Signed)
Your physician recommends that you schedule a follow-up appointment in: 3-4 WEEKS WITH DR. Shirlee Latch PER Tereso Newcomer, PA-C  Your physician recommends that you return for lab work in: TODAY BMET, BNP 414.01 CAD, 428.23 HF  Your physician has requested that you have an echocardiogram DX 414.01 CAD, 428.23 HEART FAILURE. Echocardiography is a painless test that uses sound waves to create images of your heart. It provides your doctor with information about the size and shape of your heart and how well your heart's chambers and valves are working. This procedure takes approximately one hour. There are no restrictions for this procedure.   CONTINUE ON THE SAME DOSE OF LASIX YOU ARE CURRENTLY TAKING PER SCOTT WEAVER, PA-C

## 2011-09-04 ENCOUNTER — Other Ambulatory Visit (HOSPITAL_COMMUNITY): Payer: 59 | Admitting: Radiology

## 2011-09-05 ENCOUNTER — Ambulatory Visit (HOSPITAL_COMMUNITY): Payer: Medicare Other | Attending: Physician Assistant | Admitting: Radiology

## 2011-09-05 ENCOUNTER — Encounter: Payer: Self-pay | Admitting: Physician Assistant

## 2011-09-05 DIAGNOSIS — F17200 Nicotine dependence, unspecified, uncomplicated: Secondary | ICD-10-CM | POA: Insufficient documentation

## 2011-09-05 DIAGNOSIS — I2510 Atherosclerotic heart disease of native coronary artery without angina pectoris: Secondary | ICD-10-CM | POA: Insufficient documentation

## 2011-09-05 DIAGNOSIS — I509 Heart failure, unspecified: Secondary | ICD-10-CM | POA: Insufficient documentation

## 2011-09-05 DIAGNOSIS — I10 Essential (primary) hypertension: Secondary | ICD-10-CM | POA: Insufficient documentation

## 2011-09-05 DIAGNOSIS — I2589 Other forms of chronic ischemic heart disease: Secondary | ICD-10-CM | POA: Insufficient documentation

## 2011-09-05 DIAGNOSIS — I252 Old myocardial infarction: Secondary | ICD-10-CM | POA: Insufficient documentation

## 2011-09-05 DIAGNOSIS — I5023 Acute on chronic systolic (congestive) heart failure: Secondary | ICD-10-CM

## 2011-09-19 ENCOUNTER — Ambulatory Visit: Payer: 59 | Admitting: Physician Assistant

## 2011-09-20 ENCOUNTER — Ambulatory Visit (INDEPENDENT_AMBULATORY_CARE_PROVIDER_SITE_OTHER): Payer: Medicare Other | Admitting: Physician Assistant

## 2011-09-20 ENCOUNTER — Encounter: Payer: Self-pay | Admitting: Physician Assistant

## 2011-09-20 DIAGNOSIS — I5022 Chronic systolic (congestive) heart failure: Secondary | ICD-10-CM

## 2011-09-20 DIAGNOSIS — I2510 Atherosclerotic heart disease of native coronary artery without angina pectoris: Secondary | ICD-10-CM

## 2011-09-20 DIAGNOSIS — I10 Essential (primary) hypertension: Secondary | ICD-10-CM

## 2011-09-20 MED ORDER — LOSARTAN POTASSIUM 50 MG PO TABS: 100.0000 mg | ORAL_TABLET | Freq: Every day | ORAL | Status: DC

## 2011-09-20 NOTE — Assessment & Plan Note (Signed)
Increase cozaar as noted and follow up bmet in one week.

## 2011-09-20 NOTE — Progress Notes (Signed)
History of Present Illness: Primary Cardiologist:  Dr. Marca Ancona (previous patient of Dr. Deborah Chalk) PCP:  Dr. Sharlene Motts Kirk [name] is a 77 y.o. male presents for follow up on CHF.  He is a prior patient of Dr. Deborah Chalk.  He has CAD, is s/p MI in 1999 and underwent CABG in 2005.  He had post op AFib.  He also had AFib in the setting of post ERCP pancreatitis in 2011.  Coumadin was not indicated.  He has LV dysfunction.  EF was 40% at cath in 2005.  Last echo in 2011 demonstrated EF 40-45%.  Last myoview in 2010 was low risk.  Has had evidence of volume overload in the past and has been on lasix before.  He was taken off simvastatin a few years ago due to elevated LFTs and follows with GI (Dr. Leone Payor) for dilated common bile ducts.  His ERCP was inconclusive and complicated by pancreatitis and his LFTs have been followed.  His records indicate significant bradycardia in the past on beta blockers.    I saw him 10/18 after seeing his PCP for a cough.  BNP was high and lasix was started.  He felt better at the initial visit.  I continued his meds and had him follow up today.  He feels much better.  Actually only taking Lasix prn ankle swelling now.  Cough almost gone.  No chest pain.  Notes only class 2 DOE.  No orthopnea, PND or edema.  No syncope.    Labs (08/16/11): Hgb 12.5, K 4.5, creatinine 0.8, AST 65, ALT 50, ALP 152, BNP 1068. Labs (08/24/11): K 4.1, creatinine 0.8, BNP 392  Echo 09/05/11: mild focal basal septal hypertrophy, EF 40-45%, basal to mid IL AK, post AK, grade 2 diast dysfxn, mild to mod MR, mild BAE, PASP 38  Past Medical History  Diagnosis Date  . GERD (gastroesophageal reflux disease)   . Diverticulosis     s/p partial colon resection  . CAD (coronary artery disease)     s/p CABG 2005 (L-LAD, S-Dx, S-OM1, S-PDA); patient had post op AFib;  Myoview 8/10: mild fixed lateral defect; no ischemia, EF 47%  . Hyperlipemia   . Ischemic cardiomyopathy     echo 1/11: mild MR,  mild LAE, EF 40-45%;  echo 10/12: EF 40-45%, IL AK, Post AK, grade 2 diast dysfxn, mild to mod MR, mild BAE, PASP 38  . Hypertension   . Arthritis   . Common bile duct dilatation   . Pancreatitis     post-ERCP 11/2009  . Colitis, ischemic   . Chronic systolic heart failure     Echo 09/05/11: mild focal basal septal hypertrophy, EF 40-45%, basal to mid IL AK, post AK, grade 2 diast dysfxn, mild to mod MR, mild BAE, PASP 38  . Atrial fibrillation     post CABG in 2005 and in setting of acute illness in 1/11 (post ERCP pancreatitis) - coumadin not felt to be indicated  . Lumbar spondylosis   . Elevated LFTs     statin d/c'd in past due to   . BPH (benign prostatic hyperplasia)   . Sinus bradycardia     on beta blockers    Current Outpatient Prescriptions  Medication Sig Dispense Refill  . aspirin 81 MG tablet Take 81 mg by mouth daily.        . Diclofenac Sodium CR 100 MG 24 hr tablet Take 100 mg by mouth as needed.        Marland Kitchen  finasteride (PROSCAR) 5 MG tablet Take 5 mg by mouth daily.        . furosemide (LASIX) 40 MG tablet Take 40 mg by mouth daily.        Marland Kitchen HYDROcodone-acetaminophen (VICODIN) 5-500 MG per tablet Take 1 tablet by mouth as needed.        . hyoscyamine (ANASPAZ) 0.125 MG TBDP 1-2 dissolved under tongue every 4 hours as needed for abdominal pain  60 tablet  5  . losartan (COZAAR) 50 MG tablet Take 2 tablets (100 mg total) by mouth daily.  60 tablet  6  . meclizine (ANTIVERT) 25 MG tablet Take 25 mg by mouth as needed.       . pantoprazole (PROTONIX) 40 MG tablet 1 by mouth each day 30 minutes before meal as needed       . DISCONTD: losartan (COZAAR) 50 MG tablet Take 50 mg by mouth daily.          Allergies: Allergies  Allergen Reactions  . Penicillins   . Erythromycin Itching and Rash    Patient allergic to all Mycins    Social Hx:  Still smoking cigars.  Married.    Vital Signs: BP 161/86  Pulse 73  Ht 5\' 7"  (1.702 m)  Wt 142 lb (64.411 kg)  BMI 22.24  kg/m2  Repeat BP by me on manual cuff:  144/70 on right  PHYSICAL EXAM: Well nourished, well developed, in no acute distress HEENT: normal Neck: no JVD Cardiac:  normal S1, S2; RRR; no murmur, no S3 Lungs:  clear to auscultation bilaterally, no wheezing, rhonchi or rales Abd: soft, nontender, no hepatomegaly Ext: no edema Skin: warm and dry Neuro:  CNs 2-12 intact, no focal abnormalities noted Psych: normal affect  EKG:  NSR, HR 67, normal axis, NSSTTW changes, no significant change since prior tracing, PVC  ASSESSMENT AND PLAN:

## 2011-09-20 NOTE — Assessment & Plan Note (Signed)
Volume stable.  He seems to be doing well just on PRN lasix.  We discussed weighing daily and when to take Lasix.  He will watch his salt.  No beta blocker due to poor tolerance in the past.  Increase cozaar.  Repeat bmet in one week.  Follow up with Dr. Marca Ancona in 3 mos.

## 2011-09-20 NOTE — Assessment & Plan Note (Signed)
Stable.  No angina.  Continue ASA.

## 2011-09-20 NOTE — Patient Instructions (Addendum)
Weigh daily and weight up 2-3 lbs in one day, increased swelling or increased dyspnea; you can take a Lasix that day.  Call us if you have to do this for more than one day in a row.    Your physician recommends that you schedule a follow-up appointment in: 3 MONTHS WITH DR. Shirlee Latch  TAKE LASIX AS NEEDED FOR EXTRA SWELLING  Your physician has recommended you make the following change in your medication: INCREASE COZAAR TO 100 MG DAILY

## 2011-09-27 ENCOUNTER — Other Ambulatory Visit (INDEPENDENT_AMBULATORY_CARE_PROVIDER_SITE_OTHER): Payer: Medicare Other | Admitting: *Deleted

## 2011-09-27 DIAGNOSIS — I5022 Chronic systolic (congestive) heart failure: Secondary | ICD-10-CM

## 2011-09-27 DIAGNOSIS — I10 Essential (primary) hypertension: Secondary | ICD-10-CM

## 2011-09-27 DIAGNOSIS — I2510 Atherosclerotic heart disease of native coronary artery without angina pectoris: Secondary | ICD-10-CM

## 2011-09-27 LAB — BASIC METABOLIC PANEL
BUN: 14 mg/dL (ref 6–23)
Chloride: 106 mEq/L (ref 96–112)
GFR: 98.28 mL/min (ref >60.00)
Potassium: 4.4 mEq/L (ref 3.5–5.1)
Sodium: 139 mEq/L (ref 135–145)

## 2011-10-02 ENCOUNTER — Encounter: Payer: Self-pay | Admitting: Physician Assistant

## 2011-10-21 ENCOUNTER — Emergency Department (HOSPITAL_COMMUNITY): Payer: Medicare Other

## 2011-10-21 ENCOUNTER — Emergency Department (HOSPITAL_COMMUNITY)
Admission: EM | Admit: 2011-10-21 | Discharge: 2011-10-21 | Disposition: A | Discharge status: Home / Self Care | Payer: Medicare Other | Attending: Emergency Medicine | Admitting: Emergency Medicine

## 2011-10-21 ENCOUNTER — Encounter (HOSPITAL_COMMUNITY): Payer: Self-pay | Admitting: *Deleted

## 2011-10-21 DIAGNOSIS — Z79899 Other long term (current) drug therapy: Secondary | ICD-10-CM | POA: Insufficient documentation

## 2011-10-21 DIAGNOSIS — I10 Essential (primary) hypertension: Secondary | ICD-10-CM | POA: Insufficient documentation

## 2011-10-21 DIAGNOSIS — I428 Other cardiomyopathies: Secondary | ICD-10-CM | POA: Insufficient documentation

## 2011-10-21 DIAGNOSIS — F17200 Nicotine dependence, unspecified, uncomplicated: Secondary | ICD-10-CM | POA: Insufficient documentation

## 2011-10-21 DIAGNOSIS — J111 Influenza due to unidentified influenza virus with other respiratory manifestations: Secondary | ICD-10-CM | POA: Insufficient documentation

## 2011-10-21 DIAGNOSIS — E785 Hyperlipidemia, unspecified: Secondary | ICD-10-CM | POA: Insufficient documentation

## 2011-10-21 DIAGNOSIS — I25810 Atherosclerosis of coronary artery bypass graft(s) without angina pectoris: Secondary | ICD-10-CM | POA: Insufficient documentation

## 2011-10-21 DIAGNOSIS — J438 Other emphysema: Secondary | ICD-10-CM | POA: Insufficient documentation

## 2011-10-21 MED ORDER — ACETAMINOPHEN 325 MG PO TABS
650.0000 mg | ORAL_TABLET | Freq: Once | ORAL | Status: AC
  Administered 2011-10-21: 650 mg via ORAL
  Filled 2011-10-21: qty 2

## 2011-10-21 MED ORDER — OSELTAMIVIR PHOSPHATE 75 MG PO CAPS
75.0000 mg | ORAL_CAPSULE | Freq: Once | ORAL | Status: AC
  Administered 2011-10-21: 75 mg via ORAL
  Filled 2011-10-21: qty 1

## 2011-10-21 MED ORDER — ALBUTEROL SULFATE HFA 108 (90 BASE) MCG/ACT IN AERS: 2.0000 | INHALATION_SPRAY | RESPIRATORY_TRACT | Status: DC | PRN

## 2011-10-21 MED ORDER — OSELTAMIVIR PHOSPHATE 75 MG PO CAPS: 75.0000 mg | ORAL_CAPSULE | Freq: Two times a day (BID) | ORAL | Status: AC

## 2011-10-21 MED ORDER — ALBUTEROL SULFATE (5 MG/ML) 0.5% IN NEBU
2.5000 mg | INHALATION_SOLUTION | Freq: Once | RESPIRATORY_TRACT | Status: AC
  Administered 2011-10-21: 2.5 mg via RESPIRATORY_TRACT
  Filled 2011-10-21: qty 0.5

## 2011-10-21 NOTE — ED Notes (Signed)
2 days of chills, cough, body aches, generally feels bad, symptoms worse at night

## 2011-10-21 NOTE — ED Notes (Signed)
Patient discharge home with wife no complaints noted

## 2011-10-21 NOTE — ED Provider Notes (Signed)
History     CSN: [id number] Arrival date & time: 10/21/2011  9:30 AM   First MD Initiated Contact with Patient 10/21/11 [phone]      Chief Complaint  Patient presents with  . Generalized Body Aches  . Chills    (Consider location/radiation/quality/duration/timing/severity/associated sxs/prior treatment) The history is provided by the patient.   77 year old male comes in with a two-day history of fever to 102, chills, and a cough productive of some clear sputum. This is associated with generalized arthralgias and myalgias. He has had some clear rhinorrhea. There's been no sore throat, no vomiting, no diarrhea. He denies any sick contacts, and he states he has not had his flu shot this year. He is taking some over-the-counter medication with no benefit. Symptoms are described as severe. He denies any dyspnea, but he did notice that he got more fatigued than normal when doing moderate activity.  Past Medical History  Diagnosis Date  . GERD (gastroesophageal reflux disease)   . Diverticulosis     s/p partial colon resection  . CAD (coronary artery disease)     s/p CABG 2005 (L-LAD, S-Dx, S-OM1, S-PDA); patient had post op AFib;  Myoview 8/10: mild fixed lateral defect; no ischemia, EF 47%  . Hyperlipemia   . Ischemic cardiomyopathy     echo 1/11: mild MR, mild LAE, EF 40-45%;  echo 10/12: EF 40-45%, IL AK, Post AK, grade 2 diast dysfxn, mild to mod MR, mild BAE, PASP 38  . Hypertension   . Arthritis   . Common bile duct dilatation   . Pancreatitis     post-ERCP 11/2009  . Colitis, ischemic   . Chronic systolic heart failure     Echo 09/05/11: mild focal basal septal hypertrophy, EF 40-45%, basal to mid IL AK, post AK, grade 2 diast dysfxn, mild to mod MR, mild BAE, PASP 38  . Atrial fibrillation     post CABG in 2005 and in setting of acute illness in 1/11 (post ERCP pancreatitis) - coumadin not felt to be indicated  . Lumbar spondylosis   . Elevated LFTs     statin d/c'd in past  due to   . BPH (benign prostatic hyperplasia)   . Sinus bradycardia     on beta blockers    Past Surgical History  Procedure Date  . Colon resection   . Back surgery   . Ercp 11/13/2009    dilated bile ducts, atypical cytology, repeat attempt normal pancreatogram but unsuccessful biliary cannulation  . Coronary artery bypass graft     x 4  . Angioplasty 1999  . Rotator cuff repair   . Cataract extraction     Family History  Problem Relation Age of Onset  . Diabetes Mother   . Heart disease Mother   . Lung cancer Father   . Colon cancer Neg Hx     History  Substance Use Topics  . Smoking status: Current Everyday Smoker -- 60 years    Types: Cigars  . Smokeless tobacco: Never Used   Comment: 2-cigars a day  . Alcohol Use: Yes      Review of Systems  All other systems reviewed and are negative.    Allergies  Penicillins and Erythromycin  Home Medications   Current Outpatient Rx  Name Route Sig Dispense Refill  . ASPIRIN 81 MG PO TABS Oral Take 81 mg by mouth daily.      Marland Kitchen DICLOFENAC SODIUM ER 100 MG PO TB24 Oral Take 100 mg by  mouth as needed.      Marland Kitchen FINASTERIDE 5 MG PO TABS Oral Take 5 mg by mouth daily.      . FUROSEMIDE 40 MG PO TABS Oral Take 40 mg by mouth daily.      Marland Kitchen HYDROCODONE-ACETAMINOPHEN 5-500 MG PO TABS Oral Take 1 tablet by mouth as needed.      Marland Kitchen HYOSCYAMINE SULFATE 0.125 MG PO TBDP  1-2 dissolved under tongue every 4 hours as needed for abdominal pain 60 tablet 5  . LOSARTAN POTASSIUM 50 MG PO TABS Oral Take 2 tablets (100 mg total) by mouth daily. 60 tablet 6  . MECLIZINE HCL 25 MG PO TABS Oral Take 25 mg by mouth as needed.     Marland Kitchen PANTOPRAZOLE SODIUM 40 MG PO TBEC  1 by mouth each day 30 minutes before meal as needed       BP 146/78  Pulse 94  Temp(Src) 100.7 F (38.2 C) (Oral)  Resp 20  Wt 140 lb (63.504 kg)  SpO2 93%  Physical Exam  Nursing note and vitals reviewed.   ED Course  Procedures (including critical care  time) 77 year old male who is resting comfortably and in no acute distress. Vital signs show low-grade fever temperature 100.7 at, and borderline adequate oxygen saturation of 93% on room air. Head is normocephalic and atraumatic. PERRLA, EOMI. Oropharynx clear. There is no sinus tenderness. Neck is supple without adenopathy, JVD, or bruit. Back is nontender there's no CVA tenderness. Lungs have faint rales present in the right midlung field without wheezes or rhonchi. Heart has regular rate and rhythm without murmur. There is no chest wall tenderness. Abdomen is soft, flat, nontender without masses or hepatosplenomegaly. Extremities have no cyanosis or edema, full range of motion present. Skin is warm and dry without rash. Neurologic: Mental status is normal, cranial nerves are intact, there are no motor or sensory deficits. Psychiatric: Normal mood and affect. Labs Reviewed - No data to display No results found.  Results for orders placed in visit on 09/27/11  BASIC METABOLIC PANEL      Component Value Range   Sodium 139  135 - 145 (mEq/L)   Potassium 4.4  3.5 - 5.1 (mEq/L)   Chloride 106  96 - 112 (mEq/L)   CO2 27  19 - 32 (mEq/L)   Glucose, Bld 95  70 - 99 (mg/dL)   BUN 14  6 - 23 (mg/dL)   Creatinine, Ser 0.8  0.4 - 1.5 (mg/dL)   Calcium 8.6  8.4 - 16.1 (mg/dL)   GFR 09.60  >45.40 (mL/min)   Dg Chest 2 View  10/21/2011  *RADIOLOGY REPORT*  Clinical Data: Cough and fever.  CHEST - 2 VIEW  Comparison: Single view of the chest 11/27/2009 and CT chest 12/03/2003.  Findings: The lungs are emphysematous but clear.  No pneumothorax or pleural effusion.  The patient is status post CABG. Postoperative change about the shoulders is also identified.  IMPRESSION: Emphysema without acute disease.  Original Report Authenticated By: Bernadene Bell. Maricela Curet, M.D.     No diagnosis found.    MDM  Clinical influenza, rule out pneumonia.        Dione Booze, MD 10/22/11 [phone]

## 2011-12-26 ENCOUNTER — Ambulatory Visit (INDEPENDENT_AMBULATORY_CARE_PROVIDER_SITE_OTHER): Payer: Medicare Other | Admitting: Cardiology

## 2011-12-26 ENCOUNTER — Encounter: Payer: Self-pay | Admitting: Cardiology

## 2011-12-26 DIAGNOSIS — I4891 Unspecified atrial fibrillation: Secondary | ICD-10-CM

## 2011-12-26 DIAGNOSIS — R0602 Shortness of breath: Secondary | ICD-10-CM

## 2011-12-26 DIAGNOSIS — I10 Essential (primary) hypertension: Secondary | ICD-10-CM

## 2011-12-26 DIAGNOSIS — I5022 Chronic systolic (congestive) heart failure: Secondary | ICD-10-CM

## 2011-12-26 DIAGNOSIS — I25810 Atherosclerosis of coronary artery bypass graft(s) without angina pectoris: Secondary | ICD-10-CM

## 2011-12-26 DIAGNOSIS — I2510 Atherosclerotic heart disease of native coronary artery without angina pectoris: Secondary | ICD-10-CM

## 2011-12-26 DIAGNOSIS — I509 Heart failure, unspecified: Secondary | ICD-10-CM

## 2011-12-26 DIAGNOSIS — E785 Hyperlipidemia, unspecified: Secondary | ICD-10-CM

## 2011-12-26 MED ORDER — FUROSEMIDE 20 MG PO TABS: 20.0000 mg | ORAL_TABLET | Freq: Every day | ORAL | Status: DC

## 2011-12-26 MED ORDER — PRAVASTATIN SODIUM 40 MG PO TABS: 40.0000 mg | ORAL_TABLET | Freq: Every evening | ORAL | Status: DC

## 2011-12-26 MED ORDER — LOSARTAN POTASSIUM 50 MG PO TABS: ORAL_TABLET | ORAL | Status: DC

## 2011-12-26 MED ORDER — SPIRONOLACTONE 25 MG PO TABS: ORAL_TABLET | ORAL | Status: DC

## 2011-12-26 NOTE — Progress Notes (Signed)
PCP: Dr. Jacky Kindle  78 yo with history of CAD s/p CABG, ischemic CMP, and chronic epigastric pain with dilated common bile duct and chronic transaminitis presents for cardiology followup.  He has been seen by Dr. Deborah Chalk in the past and is seen by me for the first time today.  Symptomatically, he has been doing well.  He was seen by the PA in the fall with volume overload and was diuresed with po Lasix.  He has since stopped taking the Lasix regularly and uses it every 2-3 weeks when he notes significant lower extremity swelling.  He says that he does not have the energy level that he used to have but denies significant exertional dyspnea.  He has had no chest pain.  BP is running high today; he does not check it at home.  He has not been on a beta blocker because of bradycardia.  HR in the 50s today.  He is not on a statin because of a history of a mild chronic transaminitis.  He also has a chronically dilated common bile duct.  No definite etiology for either finding has been elicited.    ECG: NSR with PACs and PVCs  Labs (6/12): LDL 92, HDl 60, AST 64, ALT 67 Labs (11/12): K 4.4, creatinine 0.8  PMH: 1. GERD 2. H/o pancreatitis: post-ERCP.  3. Chronic epigastric pain with dilated common bile duct 4. Chronic transaminitis of uncertain etiology.  Not on statin. 5. CAD: MI 1999.  CABG 2005 with LIMA-LAD, SVG-D, SVG-OM, SVG-PDA. Myoview 2010 with EF 47%, fixed lateral defect.  6. Atrial fibrillation: post-op after CABG in 2005.  Also had in setting of post-ERCP pancreatitis in 2011.  Not on anticoagulation.  7. Ischemic cardiomyopathy: Echo (10/12) with EF 40-45%, basal to mid posterior akinesis, grade II diastolic dysfunction, mild to moderate MR, PASP 38 mmHg.  8. Hyperlipidemia 9. H/o ischemic colitis 10. Bradycardia with beta blockers  SH: Lives [place] with wife, no children, occasionally smokes cigars  FH: CAD  ROS: All systems reviewed and negative except as per HPI.    Current Outpatient Prescriptions  Medication Sig Dispense Refill  . albuterol (PROVENTIL HFA;VENTOLIN HFA) 108 (90 BASE) MCG/ACT inhaler Inhale 2 puffs into the lungs every 4 (four) hours as needed for wheezing (or cough).  1 Inhaler  0  . aspirin 81 MG tablet Take 81 mg by mouth daily.       . Diclofenac Sodium CR 100 MG 24 hr tablet Take 100 mg by mouth as needed.        . finasteride (PROSCAR) 5 MG tablet Take 5 mg by mouth daily.        Marland Kitchen HYDROcodone-acetaminophen (VICODIN) 5-500 MG per tablet Take 1 tablet by mouth as needed.        . hyoscyamine (ANASPAZ) 0.125 MG TBDP 1-2 dissolved under tongue every 4 hours as needed for abdominal pain  60 tablet  5  . pantoprazole (PROTONIX) 40 MG tablet 1 by mouth each day 30 minutes before meal as needed       . DISCONTD: furosemide (LASIX) 40 MG tablet Take 40 mg by mouth daily.        Marland Kitchen DISCONTD: losartan (COZAAR) 50 MG tablet Take 2 tablets (100 mg total) by mouth daily.  60 tablet  6  . furosemide (LASIX) 20 MG tablet Take 1 tablet (20 mg total) by mouth daily.  30 tablet  6  . losartan (COZAAR) 50 MG tablet Take 1 and 1/2 tablets  twice a day(total 75mg  twice a day)  90 tablet  6  . pravastatin (PRAVACHOL) 40 MG tablet Take 1 tablet (40 mg total) by mouth every evening.  30 tablet  6  . spironolactone (ALDACTONE) 25 MG tablet Take 1/2 tablet daily(total 12.5mg  daily)  15 tablet  6    BP 172/80  Pulse 58  Ht 5\' 7"  (1.702 m)  Wt 150 lb (68.04 kg)  BMI 23.49 kg/m2 General: NAD Neck: JVP 10-12 cm, no thyromegaly or thyroid nodule.  Lungs: Crackles at bases bilaterally CV: Nondisplaced PMI.  Heart irregular S1/S2, no S3/S4, 1/6 HSM apex.  1+ edema 1/3 up lower legs bilaterally.  No carotid bruit.  Normal pedal pulses.  Abdomen: Soft, nontender, no hepatosplenomegaly, no distention.  Neurologic: Alert and oriented x 3.  Psych: Normal affect. Extremities: No clubbing or cyanosis.

## 2011-12-26 NOTE — Patient Instructions (Signed)
Take Lasix(furosemide) 20mg  daily  Start spironolactone 12.5mg  daily--this will be one-half 25mg  tablet daily.  Start Pravachol 40mg  daily in the evening.  Increase losartan to 75mg  twice a day. This will be one and one-half 50mg  tablets twice a a day.  Your physician recommends that you return for lab work in: 2 weeks--BMET/BNP  Your physician recommends that you schedule a follow-up appointment in: 1 month with Dr Shirlee Latch.  Your physician recommends that you return for lab work in: 1 month when you see Dr Barbaraann Cao profile

## 2011-12-26 NOTE — Assessment & Plan Note (Signed)
Stable with no chest pain.  Continue ASA 81, ARB.  Adding statin.  Not on beta blocker as above with bradycardia.

## 2011-12-26 NOTE — Assessment & Plan Note (Signed)
Patient looks volume overloaded on exam.  Does not report significant exertional dyspnea but tires easily.  EF 40-45% on last echo (ischemic cardiomyopathy).  I will have him start taking Lasix 20 mg daily (has been using Lasix only rarely prn).  He has not been on a beta blocker because of bradycardia.  I will increase his losartan to 75 mg bid and will start him on a low dose of spironolactone, 12.5 mg daily.  BMET/BNP in 2 wks.

## 2011-12-26 NOTE — Assessment & Plan Note (Signed)
BP high.  As above, increasing losartan and adding spironolactone.

## 2011-12-26 NOTE — Assessment & Plan Note (Signed)
Two prior episodes of atrial fibrillation in the setting of significant prior stressors.  Last episode in 2011 with pancreatitis.  He is not on coumadin. If atrial fibrillation is documented again in the future, he will need to start on anticoagulation given high CHADSVASC score.  

## 2011-12-26 NOTE — Assessment & Plan Note (Signed)
Mild chronic LFT elevation of uncertain etiology.  Known CAD, not on a statin.  I will start him on a low dose of a less potent statin, pravastatin 40 mg daily.  I will follow LFTs closely and if they remain stable, will continue the statin.  Will check LFTs in 1 month and lipids/LFTs in 2 months.

## 2012-01-09 ENCOUNTER — Other Ambulatory Visit (INDEPENDENT_AMBULATORY_CARE_PROVIDER_SITE_OTHER): Payer: Medicare Other

## 2012-01-09 DIAGNOSIS — R0602 Shortness of breath: Secondary | ICD-10-CM

## 2012-01-09 DIAGNOSIS — I25810 Atherosclerosis of coronary artery bypass graft(s) without angina pectoris: Secondary | ICD-10-CM

## 2012-01-09 DIAGNOSIS — I5022 Chronic systolic (congestive) heart failure: Secondary | ICD-10-CM

## 2012-01-09 LAB — BASIC METABOLIC PANEL
Calcium: 8.5 mg/dL (ref 8.4–10.5)
GFR: 70.98 mL/min (ref >60.00)
Sodium: 138 mEq/L (ref 135–145)

## 2012-01-09 LAB — BRAIN NATRIURETIC PEPTIDE: Pro B Natriuretic peptide (BNP): 277 pg/mL — ABNORMAL HIGH (ref 0.0–100.0)

## 2012-01-09 LAB — HEPATIC FUNCTION PANEL
ALT: 54 U/L — ABNORMAL HIGH (ref 0–53)
Albumin: 3.6 g/dL (ref 3.5–5.2)
Total Protein: 6.9 g/dL (ref 6.0–8.3)

## 2012-02-01 ENCOUNTER — Other Ambulatory Visit: Payer: Medicare Other

## 2012-02-01 ENCOUNTER — Encounter: Payer: Self-pay | Admitting: Cardiology

## 2012-02-01 ENCOUNTER — Ambulatory Visit (INDEPENDENT_AMBULATORY_CARE_PROVIDER_SITE_OTHER): Payer: Medicare Other | Admitting: Cardiology

## 2012-02-01 VITALS — BP 169/74 | HR 65 | Ht 66.0 in | Wt 151.0 lb

## 2012-02-01 DIAGNOSIS — I5022 Chronic systolic (congestive) heart failure: Secondary | ICD-10-CM

## 2012-02-01 DIAGNOSIS — R0602 Shortness of breath: Secondary | ICD-10-CM

## 2012-02-01 DIAGNOSIS — F17200 Nicotine dependence, unspecified, uncomplicated: Secondary | ICD-10-CM

## 2012-02-01 DIAGNOSIS — I25810 Atherosclerosis of coronary artery bypass graft(s) without angina pectoris: Secondary | ICD-10-CM

## 2012-02-01 DIAGNOSIS — I10 Essential (primary) hypertension: Secondary | ICD-10-CM

## 2012-02-01 DIAGNOSIS — I4891 Unspecified atrial fibrillation: Secondary | ICD-10-CM

## 2012-02-01 DIAGNOSIS — E785 Hyperlipidemia, unspecified: Secondary | ICD-10-CM

## 2012-02-01 DIAGNOSIS — Z720 Tobacco use: Secondary | ICD-10-CM

## 2012-02-01 MED ORDER — FUROSEMIDE 40 MG PO TABS: 40.0000 mg | ORAL_TABLET | Freq: Every day | ORAL | Status: DC

## 2012-02-01 MED ORDER — SPIRONOLACTONE 25 MG PO TABS: 25.0000 mg | ORAL_TABLET | Freq: Every day | ORAL | Status: DC

## 2012-02-01 MED ORDER — CARVEDILOL 3.125 MG PO TABS: 3.1250 mg | ORAL_TABLET | Freq: Two times a day (BID) | ORAL | Status: DC

## 2012-02-01 NOTE — Assessment & Plan Note (Signed)
Check lipids and LFTs, goal LDL < 70.  He is tolerating pravastatin so far without rise in transaminases .

## 2012-02-01 NOTE — Assessment & Plan Note (Signed)
Patient remains volume overloaded.  Symptoms are probably NYHA class II but he is not particularly active.   - Increase Lasix to 40 mg daily with BMET/BNP in 2 wks.  - Increase spironolactone to 25 mg daily.  - Add Coreg 3.125 mg bid.  HR is in the 60s today.  Suspect he will tolerate low dose of Coreg but will follow closely.  - Continue losartan 75 mg bid.

## 2012-02-01 NOTE — Assessment & Plan Note (Signed)
As above, increasing spironolactone and adding Coreg.   He has a home BP cuff: I will have him monitor BP daily and call us with the numbers.

## 2012-02-01 NOTE — Progress Notes (Signed)
PCP: Dr. Jacky Kindle  78 yo with history of CAD s/p CABG, ischemic CMP, and chronic epigastric pain with dilated common bile duct and chronic transaminitis presents for cardiology followup.   Symptomatically, he has been stable.  He continues to take Lasix 20 mg daily.  Weight is stable.  He reports fatigue but denies significant exertional dyspnea.  He can climb a flight of steps without problems.  He has had no chest pain.  BP is running high today; he does not check it at home.  LFTs have been stable on pravastatin.  He continues to smoke cigars.   Labs (6/12): LDL 92, HDl 60, AST 64, ALT 67 Labs (11/12): K 4.4, creatinine 0.8 Labs (3/13): AST 63, ALT 54, BNP 277, K 4.1, creatinine 1.1  PMH: 1. GERD 2. H/o pancreatitis: post-ERCP.  3. Chronic epigastric pain with dilated common bile duct 4. Chronic transaminitis of uncertain etiology.  Not on statin. 5. CAD: MI 1999.  CABG 2005 with LIMA-LAD, SVG-D, SVG-OM, SVG-PDA. Myoview 2010 with EF 47%, fixed lateral defect.  6. Atrial fibrillation: post-op after CABG in 2005.  Also had in setting of post-ERCP pancreatitis in 2011.  Not on anticoagulation.  7. Ischemic cardiomyopathy: Echo (10/12) with EF 40-45%, basal to mid posterior akinesis, grade II diastolic dysfunction, mild to moderate MR, PASP 38 mmHg.  8. Hyperlipidemia 9. H/o ischemic colitis 10. Bradycardia with beta blockers  SH: Lives [place] with wife, no children, occasionally smokes cigars  FH: CAD  ROS: All systems reviewed and negative except as per HPI.   Current Outpatient Prescriptions  Medication Sig Dispense Refill  . albuterol (PROVENTIL HFA;VENTOLIN HFA) 108 (90 BASE) MCG/ACT inhaler Inhale 2 puffs into the lungs every 4 (four) hours as needed for wheezing (or cough).  1 Inhaler  0  . aspirin 81 MG tablet Take 81 mg by mouth daily.       . finasteride (PROSCAR) 5 MG tablet Take 5 mg by mouth daily.        Marland Kitchen HYDROcodone-acetaminophen (VICODIN) 5-500 MG per  tablet Take 1 tablet by mouth as needed.        . hyoscyamine (ANASPAZ) 0.125 MG TBDP 1-2 dissolved under tongue every 4 hours as needed for abdominal pain  60 tablet  5  . losartan (COZAAR) 50 MG tablet Take 1 and 1/2 tablets twice a day(total 75mg  twice a day)  90 tablet  6  . pantoprazole (PROTONIX) 40 MG tablet 1 by mouth each day 30 minutes before meal as needed       . pravastatin (PRAVACHOL) 40 MG tablet Take 1 tablet (40 mg total) by mouth every evening.  30 tablet  6  . DISCONTD: furosemide (LASIX) 20 MG tablet Take 1 tablet (20 mg total) by mouth daily.  30 tablet  6  . DISCONTD: spironolactone (ALDACTONE) 25 MG tablet Take 1/2 tablet daily(total 12.5mg  daily)  15 tablet  6  . carvedilol (COREG) 3.125 MG tablet Take 1 tablet (3.125 mg total) by mouth 2 (two) times daily.  60 tablet  6  . Diclofenac Sodium CR 100 MG 24 hr tablet Take 100 mg by mouth as needed.        . furosemide (LASIX) 40 MG tablet Take 1 tablet (40 mg total) by mouth daily.  30 tablet  6  . spironolactone (ALDACTONE) 25 MG tablet Take 1 tablet (25 mg total) by mouth daily.  30 tablet  6    BP 169/74  Pulse 65  Ht  5\' 6"  (1.676 m)  Wt 151 lb (68.493 kg)  BMI 24.37 kg/m2 General: NAD Neck: JVP 8-9 cm, no thyromegaly or thyroid nodule.  Lungs: Crackles at bases bilaterally CV: Nondisplaced PMI.  Heart irregular S1/S2, no S3/S4, 1/6 HSM apex.  1+ edema 1/2 up lower legs bilaterally.  No carotid bruit.  Normal pedal pulses.  Abdomen: Soft, nontender, no hepatosplenomegaly, no distention.  Neurologic: Alert and oriented x 3.  Psych: Normal affect. Extremities: No clubbing or cyanosis.

## 2012-02-01 NOTE — Patient Instructions (Signed)
Increase lasix(furosemide) to 40mg  daily.  Increase spironolactone to 25mg  daily.  Start Coreg 3.125mg  twice a day.  Your physician recommends that you return for a FASTING lipid profile /liver profile/BMET/BNP 428.22  414.05--in 2 weeks.  You do not need lab today.  Your physician recommends that you schedule a follow-up appointment in: 2 months with Dr Shirlee Latch.  Ask your wife to check your blood pressure at home. Record the readings. Call our office in 2-3 weeks with the readings. Luana Shu [phone]

## 2012-02-01 NOTE — Assessment & Plan Note (Signed)
I strongly encouraged him to quit smoking.

## 2012-02-01 NOTE — Assessment & Plan Note (Signed)
Two prior episodes of atrial fibrillation in the setting of significant prior stressors.  Last episode in 2011 with pancreatitis.  He is not on coumadin. If atrial fibrillation is documented again in the future, he will need to start on anticoagulation given high CHADSVASC score.

## 2012-02-12 ENCOUNTER — Other Ambulatory Visit (INDEPENDENT_AMBULATORY_CARE_PROVIDER_SITE_OTHER): Payer: Medicare Other

## 2012-02-12 DIAGNOSIS — I5022 Chronic systolic (congestive) heart failure: Secondary | ICD-10-CM

## 2012-02-12 DIAGNOSIS — I25810 Atherosclerosis of coronary artery bypass graft(s) without angina pectoris: Secondary | ICD-10-CM

## 2012-02-12 DIAGNOSIS — R0602 Shortness of breath: Secondary | ICD-10-CM

## 2012-02-12 LAB — HEPATIC FUNCTION PANEL
Albumin: 3.7 g/dL (ref 3.5–5.2)
Bilirubin, Direct: 0.1 mg/dL (ref 0.0–0.3)
Total Protein: 7 g/dL (ref 6.0–8.3)

## 2012-02-12 LAB — LIPID PANEL
Cholesterol: 134 mg/dL (ref 0–200)
LDL Cholesterol: 80 mg/dL (ref 0–99)
Triglycerides: 56 mg/dL (ref 0.0–149.0)
VLDL: 11.2 mg/dL (ref 0.0–40.0)

## 2012-02-12 LAB — BASIC METABOLIC PANEL
Chloride: 108 mEq/L (ref 96–112)
Potassium: 4.6 mEq/L (ref 3.5–5.1)

## 2012-02-12 LAB — BRAIN NATRIURETIC PEPTIDE: Pro B Natriuretic peptide (BNP): 320 pg/mL — ABNORMAL HIGH (ref 0.0–100.0)

## 2012-03-25 ENCOUNTER — Ambulatory Visit (INDEPENDENT_AMBULATORY_CARE_PROVIDER_SITE_OTHER): Payer: Medicare Other | Admitting: Cardiology

## 2012-03-25 ENCOUNTER — Encounter: Payer: Self-pay | Admitting: Cardiology

## 2012-03-25 VITALS — BP 145/73 | HR 72 | Ht 67.0 in | Wt 145.0 lb

## 2012-03-25 DIAGNOSIS — I4891 Unspecified atrial fibrillation: Secondary | ICD-10-CM

## 2012-03-25 DIAGNOSIS — E785 Hyperlipidemia, unspecified: Secondary | ICD-10-CM

## 2012-03-25 DIAGNOSIS — I25810 Atherosclerosis of coronary artery bypass graft(s) without angina pectoris: Secondary | ICD-10-CM

## 2012-03-25 DIAGNOSIS — I2510 Atherosclerotic heart disease of native coronary artery without angina pectoris: Secondary | ICD-10-CM

## 2012-03-25 DIAGNOSIS — I5022 Chronic systolic (congestive) heart failure: Secondary | ICD-10-CM

## 2012-03-25 LAB — BASIC METABOLIC PANEL
BUN: 18 mg/dL (ref 6–23)
CO2: 26 mEq/L (ref 19–32)
Calcium: 8.7 mg/dL (ref 8.4–10.5)
Chloride: 105 mEq/L (ref 96–112)
Creatinine, Ser: 1 mg/dL (ref 0.4–1.5)
Glucose, Bld: 73 mg/dL (ref 70–99)

## 2012-03-25 MED ORDER — CARVEDILOL 3.125 MG PO TABS: 3.1250 mg | ORAL_TABLET | Freq: Two times a day (BID) | ORAL | Status: DC

## 2012-03-25 MED ORDER — PRAVASTATIN SODIUM 40 MG PO TABS: 40.0000 mg | ORAL_TABLET | Freq: Every evening | ORAL | Status: DC

## 2012-03-25 NOTE — Assessment & Plan Note (Signed)
Two prior episodes of atrial fibrillation in the setting of significant prior stressors.  Last episode in 2011 with pancreatitis.  He is not on coumadin. If atrial fibrillation is documented again in the future, he will need to start on anticoagulation given high CHADSVASC score.

## 2012-03-25 NOTE — Assessment & Plan Note (Signed)
NYHA class II symptoms.  EF 40-45% on last echo.  He does not appear volume overloaded today (improved on higher Lasix dose).  - Continue current Lasix dose and check BMET now that he is off spironolactone to see if he needs supplemental K.  - Continue losartan.  - Will restart low dose Coreg 3.125 mg bid but leave off spironolactone.

## 2012-03-25 NOTE — Assessment & Plan Note (Signed)
Good lipids on current dose of pravastatin, transaminases normal when last checked.  Continue pravastatin.

## 2012-03-25 NOTE — Assessment & Plan Note (Signed)
Stable with no chest pain.  Continue ASA 81, ARB, statin.   

## 2012-03-25 NOTE — Progress Notes (Signed)
PCP: Dr. Jacky Kindle  78 yo with history of CAD s/p CABG, ischemic CMP, and chronic epigastric pain with dilated common bile duct and chronic transaminitis presents for cardiology followup.   Symptomatically, he is doing better with a higher dose of Lasix.  Less ankle swelling.  He is able to walk about 1/4 mile with no dyspnea.  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  No chest pain.  No tachypalpitations.  He stopped Coreg and spironolactone because he says they were making him feel "jittery."  Last time LFTs were checked, they were normal.  He continues to smoke cigars but says he is cutting back because they are getting expensive.   Labs (6/12): LDL 92, HDl 60, AST 64, ALT 67 Labs (11/12): K 4.4, creatinine 0.8 Labs (3/13): AST 63, ALT 54, BNP 277, K 4.1, creatinine 1.1 Labs (4/13): LDL 80, HDL 43, AST 33, ALT 22, K 4.6, creatinine 1.1, proBNP 320  ECG: NSR, anterolateral T wave inversions, PACs  PMH: 1. GERD 2. H/o pancreatitis: post-ERCP.  3. Chronic epigastric pain with dilated common bile duct 4. Chronic transaminitis of uncertain etiology.  Not on statin. 5. CAD: MI 1999.  CABG 2005 with LIMA-LAD, SVG-D, SVG-OM, SVG-PDA. Myoview 2010 with EF 47%, fixed lateral defect.  6. Atrial fibrillation: post-op after CABG in 2005.  Also had in setting of post-ERCP pancreatitis in 2011.  Not on anticoagulation.  7. Ischemic cardiomyopathy: Echo (10/12) with EF 40-45%, basal to mid posterior akinesis, grade II diastolic dysfunction, mild to moderate MR, PASP 38 mmHg.  8. Hyperlipidemia 9. H/o ischemic colitis 10. 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   Current Outpatient Prescriptions  Medication Sig Dispense Refill  . albuterol (PROVENTIL HFA;VENTOLIN HFA) 108 (90 BASE) MCG/ACT  inhaler Inhale 2 puffs into the lungs every 4 (four) hours as needed for wheezing (or cough).  1 Inhaler  0  . aspirin 81 MG tablet Take 81 mg by mouth daily.       . Diclofenac Sodium CR 100 MG 24 hr tablet Take 100 mg by mouth as needed.        . finasteride (PROSCAR) 5 MG tablet Take 5 mg by mouth daily.        . furosemide (LASIX) 40 MG tablet Take 1 tablet (40 mg total) by mouth daily.  30 tablet  6  . HYDROcodone-acetaminophen (VICODIN) 5-500 MG per tablet Take 1 tablet by mouth as needed.        . hyoscyamine (ANASPAZ) 0.125 MG TBDP 1-2 dissolved under tongue every 4 hours as needed for abdominal pain  60 tablet  5  . losartan (COZAAR) 50 MG tablet Take 1 and 1/2 tablets twice a day(total 75mg  twice a day)  90 tablet  6  . pantoprazole (PROTONIX) 40 MG tablet 1 by mouth each day 30 minutes before meal as needed       . carvedilol (COREG) 3.125 MG tablet Take 1 tablet (3.125 mg total) by mouth 2 (two) times daily.  60 tablet  6  . pravastatin (PRAVACHOL) 40 MG tablet Take 1 tablet (40 mg total) by mouth every evening.  30 tablet  6  . spironolactone (ALDACTONE) 25 MG tablet Take 1 tablet (25 mg total) by mouth daily.  30 tablet  6  BP 145/73  Pulse 72  Ht 5\' 7"  (1.702 m)  Wt 145 lb (65.772 kg)  BMI 22.71 kg/m2 General: NAD Neck: JVP not elevated, no thyromegaly or thyroid nodule.  Lungs: Crackles at bases bilaterally CV: Nondisplaced PMI.  Heart regular S1/S2, no S3/S4, 1/6 HSM apex.  Trace ankle edema.  No carotid bruit.  Abdomen: Soft, nontender, no hepatosplenomegaly, no distention.  Neurologic: Alert and oriented x 3.  Psych: Normal affect. Extremities: No clubbing or cyanosis.

## 2012-03-25 NOTE — Patient Instructions (Addendum)
Your physician recommends that you schedule a follow-up appointment in: 3 months with Dr. Shirlee Latch.  Stop Spironolactone.  Re-Start Coreg 3.125mg  twice daily.  BMET today.

## 2012-03-27 ENCOUNTER — Other Ambulatory Visit: Payer: Self-pay | Admitting: Internal Medicine

## 2012-06-27 ENCOUNTER — Encounter: Payer: Self-pay | Admitting: Cardiology

## 2012-06-27 ENCOUNTER — Ambulatory Visit (INDEPENDENT_AMBULATORY_CARE_PROVIDER_SITE_OTHER): Payer: Medicare Other | Admitting: Cardiology

## 2012-06-27 VITALS — BP 142/70 | HR 71 | Ht 67.0 in | Wt 148.0 lb

## 2012-06-27 DIAGNOSIS — I4891 Unspecified atrial fibrillation: Secondary | ICD-10-CM

## 2012-06-27 DIAGNOSIS — I509 Heart failure, unspecified: Secondary | ICD-10-CM

## 2012-06-27 DIAGNOSIS — E785 Hyperlipidemia, unspecified: Secondary | ICD-10-CM

## 2012-06-27 DIAGNOSIS — R0602 Shortness of breath: Secondary | ICD-10-CM

## 2012-06-27 DIAGNOSIS — I2510 Atherosclerotic heart disease of native coronary artery without angina pectoris: Secondary | ICD-10-CM

## 2012-06-27 DIAGNOSIS — I5022 Chronic systolic (congestive) heart failure: Secondary | ICD-10-CM

## 2012-06-27 NOTE — Progress Notes (Signed)
Patient ID: Kirk [name], male   DOB: 1927-08-13, 78 y.o.   MRN: [id number] PCP: Dr. Jacky Kindle  79 yo with history of CAD s/p CABG, ischemic CMP, and chronic epigastric pain with dilated common bile duct and chronic transaminitis presents for cardiology followup.   He is stable symptomatically.  He does not note dyspnea walking on flat ground but is short of breath walking up a hill or stairs.  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  No chest pain.  No tachypalpitations.  He has been taking 20 mg daily of Lasix rather than 40 mg daily because he has to go to the bathroom too often with 40 mg daily.  He has been swelling more in his lower legs.    Labs (6/12): LDL 92, HDl 60, AST 64, ALT 67 Labs (11/12): K 4.4, creatinine 0.8 Labs (3/13): AST 63, ALT 54, BNP 277, K 4.1, creatinine 1.1 Labs (4/13): LDL 80, HDL 43, AST 33, ALT 22, K 4.6, creatinine 1.1, proBNP 320 Labs (5/13): K 4.2, creatinine 1.0  ECG: NSR, LVH with repolarization, old inferior MI  PMH: 1. GERD 2. H/o pancreatitis: post-ERCP.  3. Chronic epigastric pain with dilated common bile duct 4. Chronic transaminitis of uncertain etiology.  Not on statin. 5. CAD: MI 1999.  CABG 2005 with LIMA-LAD, SVG-D, SVG-OM, SVG-PDA. Myoview 2010 with EF 47%, fixed lateral defect.  6. Atrial fibrillation: post-op after CABG in 2005.  Also had in setting of post-ERCP pancreatitis in 2011.  Not on anticoagulation.  7. Ischemic cardiomyopathy: Echo (10/12) with EF 40-45%, basal to mid posterior akinesis, grade II diastolic dysfunction, mild to moderate MR, PASP 38 mmHg.  8. Hyperlipidemia 9. H/o ischemic colitis 10. 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   Current Outpatient Prescriptions  Medication Sig Dispense Refill  . albuterol  (PROVENTIL HFA;VENTOLIN HFA) 108 (90 BASE) MCG/ACT inhaler Inhale 2 puffs into the lungs every 4 (four) hours as needed for wheezing (or cough).  1 Inhaler  0  . aspirin 81 MG tablet Take 81 mg by mouth daily.       . carvedilol (COREG) 3.125 MG tablet Take 1 tablet (3.125 mg total) by mouth 2 (two) times daily.  180 tablet  3  . Diclofenac Sodium CR 100 MG 24 hr tablet Take 100 mg by mouth as needed.        . finasteride (PROSCAR) 5 MG tablet Take 5 mg by mouth daily.        . furosemide (LASIX) 40 MG tablet Take 1 tablet (40 mg total) by mouth daily.  30 tablet  6  . HYDROcodone-acetaminophen (VICODIN) 5-500 MG per tablet Take 1 tablet by mouth as needed.        . hyoscyamine (ANASPAZ) 0.125 MG TBDP 1-2 dissolved under tongue every 4 hours as needed for abdominal pain  60 tablet  5  . hyoscyamine (LEVSIN SL) 0.125 MG SL tablet PLACE 1 TO 2 TABLETS UNDER TONGUE EVERY 4 HOURS AS NEEDED FOR ABDOMINAL PAIN  60 tablet  0  . losartan (COZAAR) 50 MG tablet Take 1 and 1/2 tablets twice a day(total 75mg  twice a day)  90 tablet  6  . pantoprazole (PROTONIX) 40 MG tablet 1 by mouth each day 30 minutes before meal as needed       .  pravastatin (PRAVACHOL) 40 MG tablet Take 1 tablet (40 mg total) by mouth every evening.  90 tablet  3    BP 142/70  Pulse 71  Ht 5\' 7"  (1.702 m)  Wt 148 lb (67.132 kg)  BMI 23.18 kg/m2 General: NAD Neck: JVP 8 cm, no thyromegaly or thyroid nodule.  Lungs: Crackles at bases bilaterally CV: Nondisplaced PMI.  Heart regular S1/S2, no S3/S4, 1/6 HSM apex.  1+ edema to knees bilaterally.  No carotid bruit.  Abdomen: Soft, nontender, no hepatosplenomegaly, no distention.  Neurologic: Alert and oriented x 3.  Psych: Normal affect. Extremities: No clubbing or cyanosis.

## 2012-06-27 NOTE — Assessment & Plan Note (Signed)
Two prior episodes of atrial fibrillation in the setting of significant prior stressors.  Last episode in 2011 with pancreatitis.  He remains in NSR today. He is not on coumadin. If atrial fibrillation is documented again in the future, he will need to start on anticoagulation given high CHADSVASC score.

## 2012-06-27 NOTE — Patient Instructions (Signed)
Increase Lasix to 40mg  daily.  Your physician recommends that you return for lab work in: 1 week.  BMET / BNP.  Your physician recommends that you schedule a follow-up appointment in: 4 months with Dr. Shirlee Latch.

## 2012-06-27 NOTE — Assessment & Plan Note (Signed)
Goal LDL < 70 with known coronary disease.  He will be getting lipids at PCP's office in September.  I will ask him to send me a copy.

## 2012-06-27 NOTE — Assessment & Plan Note (Signed)
NYHA class II symptoms.  EF 40-45% on last echo.  He appears more volume overloaded today on lower dose of Lasix.  - Increase Lasix back to 40 mg daily.  He will take it later in the day so he can get out in the morning without having to worry about finding a bathroom.  - Continue current doses of losartan and Coreg.  - BMET/BNP in 1 week.

## 2012-06-27 NOTE — Assessment & Plan Note (Signed)
Stable with no chest pain.  Continue ASA 81, ARB, statin.

## 2012-07-04 ENCOUNTER — Other Ambulatory Visit (INDEPENDENT_AMBULATORY_CARE_PROVIDER_SITE_OTHER): Payer: Medicare Other

## 2012-07-04 DIAGNOSIS — I509 Heart failure, unspecified: Secondary | ICD-10-CM

## 2012-07-04 DIAGNOSIS — R0602 Shortness of breath: Secondary | ICD-10-CM

## 2012-07-04 LAB — BASIC METABOLIC PANEL
BUN: 14 mg/dL (ref 6–23)
Creatinine, Ser: 0.8 mg/dL (ref 0.4–1.5)
GFR: 101.01 mL/min (ref >60.00)
Glucose, Bld: 101 mg/dL — ABNORMAL HIGH (ref 70–99)
Potassium: 4.4 mEq/L (ref 3.5–5.1)

## 2012-07-24 ENCOUNTER — Encounter: Payer: Self-pay | Admitting: Internal Medicine

## 2012-08-02 ENCOUNTER — Ambulatory Visit (INDEPENDENT_AMBULATORY_CARE_PROVIDER_SITE_OTHER): Payer: Medicare Other | Admitting: Internal Medicine

## 2012-08-02 ENCOUNTER — Other Ambulatory Visit: Payer: Self-pay

## 2012-08-02 ENCOUNTER — Other Ambulatory Visit (INDEPENDENT_AMBULATORY_CARE_PROVIDER_SITE_OTHER): Payer: Medicare Other

## 2012-08-02 ENCOUNTER — Encounter: Payer: Self-pay | Admitting: Internal Medicine

## 2012-08-02 VITALS — BP 130/64 | HR 68 | Ht 67.0 in | Wt 145.5 lb

## 2012-08-02 DIAGNOSIS — R945 Abnormal results of liver function studies: Secondary | ICD-10-CM

## 2012-08-02 DIAGNOSIS — K838 Other specified diseases of biliary tract: Secondary | ICD-10-CM

## 2012-08-02 DIAGNOSIS — R7989 Other specified abnormal findings of blood chemistry: Secondary | ICD-10-CM

## 2012-08-02 DIAGNOSIS — K8689 Other specified diseases of pancreas: Secondary | ICD-10-CM

## 2012-08-02 DIAGNOSIS — R1013 Epigastric pain: Secondary | ICD-10-CM

## 2012-08-02 DIAGNOSIS — R932 Abnormal findings on diagnostic imaging of liver and biliary tract: Secondary | ICD-10-CM

## 2012-08-02 LAB — COMPREHENSIVE METABOLIC PANEL
AST: 115 U/L — ABNORMAL HIGH (ref 0–37)
BUN: 14 mg/dL (ref 6–23)
Calcium: 8.3 mg/dL — ABNORMAL LOW (ref 8.4–10.5)
Chloride: 106 mEq/L (ref 96–112)
Creatinine, Ser: 0.9 mg/dL (ref 0.4–1.5)
GFR: 87.86 mL/min (ref >60.00)
Total Bilirubin: 0.8 mg/dL (ref 0.3–1.2)

## 2012-08-02 NOTE — Progress Notes (Signed)
Quick Note:  Labs are up again Have him get a CT of abdomen with contrast using pancreatic protocol - evaluate for pancreatic mass in patient with dilated bile duct and pancreatic duct ______

## 2012-08-02 NOTE — Patient Instructions (Addendum)
You have been given a separate informational sheet regarding your tobacco use, the importance of quitting and local resources to help you quit.  Your physician has requested that you go to the basement for the following lab work before leaving today: CMET  We will call you with results and plans.  Thank you for choosing me and [facility].  Iva Boop, M.D., [facility]

## 2012-08-02 NOTE — Progress Notes (Signed)
  Subjective:    Patient ID: Kirk [name], male    DOB: 1927/05/21, 78 y.o.   MRN: [id number]  HPI The patient returns, he was last seen over a year ago. He's had some episodic recurrence of his epigastric pain that is relieved by hyoscyamine. He has been a little bit more bothersome that in the past, I think he went for a while without this and then it recurred. Again it probably response to the hyoscyamine everything as well. He has not noted jaundice, fever or chills and in general has been doing reasonably well.  He is stressed and bothered by people bothering him with requests for help all the time. He says that his neighbors think he has free time to help them since he is retired, and he has a difficult time turning him down when they asked him for help. This is frustrating.  Wt Readings from Last 3 Encounters:  08/02/12 145 lb 8 oz (65.998 kg)  06/27/12 148 lb (67.132 kg)  03/25/12 145 lb (65.772 kg)    Medications, allergies, past medical history, past surgical history, family history and social history are reviewed and updated in the EMR.   Review of Systems As above    Objective:   Physical Exam General:  NAD Eyes:   anicteric Lungs:  clear Heart:  S1S2 no rubs, murmurs or gallops Abdomen:  soft and nontender, BS+   LFTs returned to normal in the spring when checked by     Assessment & Plan:   1. Epigastric pain   2. Dilated bile duct   3. Abnormal LFTs   4. Nonspecific (abnormal) findings on radiological and other examination of biliary tract    1. I have rechecked his LFTs of the plan to reimage if they're elevated. They were, so will go ahead and check CT of the abdomen. She's never had any mass these had dilation of the pancreatic and biliary duct with nondiagnostic rushing. He had some atypical cells. His problems with a dilated biliary tree date back to at least December of 2010. His weight has been stable and his symptom pattern has been very similar. He  is somewhat concerned about the possibility of cancer, we have never found that and have planned on conservative followup. At least would avoid ERCP if possible as he did have post-ERCP pancreatitis in the past. Last MRI was in 2011 with no suspicious findings. 2. Continue hyoscyamine intermittently, this provides prompt relief. I have always thought that he probably had some sort of bowel spasm though it's possible biliary duct pain could be relieved by this if there is transient obstruction and dilation related to smooth muscle. 3. CA 19-9 was checked as well.    Chemistry      Component Value Date/Time   NA 137 08/02/2012 1154   K 4.6 08/02/2012 1154   CL 106 08/02/2012 1154   CO2 26 08/02/2012 1154   BUN 14 08/02/2012 1154   CREATININE 0.9 08/02/2012 1154      Component Value Date/Time   CALCIUM 8.3* 08/02/2012 1154   ALKPHOS 148* 08/02/2012 1154   AST 115* 08/02/2012 1154   ALT 58* 08/02/2012 1154   BILITOT 0.8 08/02/2012 1154       CC: Minda Meo, MD

## 2012-08-06 ENCOUNTER — Ambulatory Visit (INDEPENDENT_AMBULATORY_CARE_PROVIDER_SITE_OTHER)
Admission: RE | Admit: 2012-08-06 | Discharge: 2012-08-06 | Disposition: A | Discharge status: Home / Self Care | Payer: Medicare Other | Source: Ambulatory Visit | Attending: Internal Medicine | Admitting: Internal Medicine

## 2012-08-06 ENCOUNTER — Other Ambulatory Visit: Payer: Self-pay

## 2012-08-06 DIAGNOSIS — K7460 Unspecified cirrhosis of liver: Secondary | ICD-10-CM

## 2012-08-06 DIAGNOSIS — K838 Other specified diseases of biliary tract: Secondary | ICD-10-CM

## 2012-08-06 DIAGNOSIS — K8689 Other specified diseases of pancreas: Secondary | ICD-10-CM

## 2012-08-06 MED ORDER — IOHEXOL 350 MG/ML SOLN
100.0000 mL | Freq: Once | INTRAVENOUS | Status: AC | PRN
  Administered 2012-08-06: 100 mL via INTRAVENOUS

## 2012-08-06 NOTE — Progress Notes (Signed)
Quick Note:  Let him know that there is a new finding of cirrhosis of the liver (scarring in the liver). Cause not clear.  1) schedule October REV please - not urgent 2) PT/INR 3) CBC 4) Hep B surface Ab 5) Hep B surface Ag 6) Hep C antibody 7) Hep A antibody total 8) ferritin 9) ANA 10) do not use alcohol if he is  Please cc this note and CT result to PCP   ______

## 2012-08-07 ENCOUNTER — Other Ambulatory Visit (INDEPENDENT_AMBULATORY_CARE_PROVIDER_SITE_OTHER): Payer: Medicare Other

## 2012-08-07 DIAGNOSIS — K7460 Unspecified cirrhosis of liver: Secondary | ICD-10-CM

## 2012-08-07 LAB — CBC WITH DIFFERENTIAL/PLATELET
Basophils Absolute: 0 10*3/uL (ref 0.0–0.1)
Eosinophils Absolute: 0.3 10*3/uL (ref 0.0–0.7)
Hemoglobin: 11.5 g/dL — ABNORMAL LOW (ref 13.0–17.0)
Lymphocytes Relative: 21.4 % (ref 12.0–46.0)
Monocytes Relative: 7 % (ref 3.0–12.0)
Neutro Abs: 6.5 10*3/uL (ref 1.4–7.7)
Neutrophils Relative %: 67.8 % (ref 43.0–77.0)
RDW: 14.1 % (ref 11.5–14.6)

## 2012-08-07 LAB — HEPATITIS B SURFACE ANTIBODY, QUANTITATIVE: Hepatitis B-Post: 0 m[IU]/mL

## 2012-08-07 LAB — HEPATITIS C ANTIBODY: HCV Ab: NEGATIVE

## 2012-08-07 LAB — PROTIME-INR
INR: 1.2 ratio — ABNORMAL HIGH (ref 0.8–1.0)
Prothrombin Time: 12.6 s — ABNORMAL HIGH (ref 10.2–12.4)

## 2012-08-08 LAB — HEPATITIS A ANTIBODY, TOTAL: Hep A Total Ab: NEGATIVE

## 2012-08-08 LAB — FERRITIN: Ferritin: 84.6 ng/mL (ref 22.0–322.0)

## 2012-08-09 NOTE — Progress Notes (Signed)
Quick Note:  Let him know these labs are basically ok Not clear why he has a liver problem and I really do not think it is related to his abdominal pain  1) Schedule next avail REV to discuss 2) ask [facility] Jacky Kindle is pcp) for his vaccine records - he should be sure to get his flu shot this year if he has not already ______

## 2012-09-03 ENCOUNTER — Ambulatory Visit: Payer: Medicare Other | Admitting: Internal Medicine

## 2012-09-17 ENCOUNTER — Encounter: Payer: Self-pay | Admitting: Internal Medicine

## 2012-09-17 ENCOUNTER — Ambulatory Visit (INDEPENDENT_AMBULATORY_CARE_PROVIDER_SITE_OTHER): Payer: Medicare Other | Admitting: Internal Medicine

## 2012-09-17 VITALS — BP 150/70 | HR 80 | Ht 67.0 in | Wt 146.0 lb

## 2012-09-17 DIAGNOSIS — R188 Other ascites: Secondary | ICD-10-CM

## 2012-09-17 DIAGNOSIS — K7460 Unspecified cirrhosis of liver: Secondary | ICD-10-CM

## 2012-09-17 DIAGNOSIS — Z23 Encounter for immunization: Secondary | ICD-10-CM

## 2012-09-17 DIAGNOSIS — R932 Abnormal findings on diagnostic imaging of liver and biliary tract: Secondary | ICD-10-CM

## 2012-09-17 NOTE — Progress Notes (Signed)
  Subjective:    Patient ID: Kirk [name], male    DOB: [birth year], 78 y.o.   MRN: [id number]  HPI Patient returns for followup, he has had dilated bile ducts of unclear etiology and intermittent epigastric pain. CT scan performed after last visit demonstrated findings consistent with cirrhosis, a very small amount of ascites and varices. A lab workup did not reveal a cause, he was negative or hepatitis A, B, and C, ANA was negative and a ferritin was not elevated. He has gotten a flu vaccination and believes he has had a pneumonia vaccine. He says he has been on a low-sodium diet for years, he have occasional pedal edema, his weight is stable. Wt Readings from Last 3 Encounters:  09/17/12 146 lb (66.225 kg)  08/02/12 145 lb 8 oz (65.998 kg)  06/27/12 148 lb (67.132 kg)   Medications, allergies, past medical history, past surgical history, family history and social history are reviewed and updated in the EMR.  Review of Systems No other complaints    Objective:   Physical Exam General:  NAD Eyes:   anicteric Lungs:  clear Heart:  S1S2 no rubs, murmurs or gallops Abdomen:  soft and nontender, BS+ no hepatosplenomegaly Ext:   Trace bilateral lower extremity edema into the lower pretibial area Skin:  A few spider angiomata are seen on the upper chest    Data Reviewed:  Previous labs and CT scan images from October and September 2013     Assessment & Plan:   1. Dilated bile ducts, unclear significance   Unchanged on last CT   2. Cirrhosis   New diagnosis unclear etiology. Could be from on a biliary obstruction, question cardiac. There is some decompensation with ascites. Otherwise he seems okay and in no distress. There is not enough ascites to perform paracentesis   3. Ascites    1. EGD to screen for esophageal varices will be scheduled.The risks and benefits as well as alternatives of endoscopic procedure(s) have been discussed and reviewed. All questions answered. The  patient agrees to proceed. 2. He needs vaccination for hepatitis A and B. and we will start that. 3. I've explained the nature of cirrhosis and now is idiopathic in his situation. 4. Further plans pending the results of the EGD and clinical course, I will arrange followup after the EGD.   CC: Minda Meo, MD

## 2012-09-17 NOTE — Patient Instructions (Addendum)
You have been scheduled for an endoscopy with propofol. Please follow written instructions given to you at your visit today. If you use inhalers (even only as needed) or a CPAP machine, please bring them with you on the day of your procedure.  You have been given a Twin Vrix injection today.  You have to come back in 7 days for another one and again day 21-30 from now, then a booster in 1 year.  This injection is to protect you from Hepatitis A and B.  You have been given a handout on cirrhosis to read.  Thank you for choosing me and [facility].  Iva Boop, M.D., [facility]

## 2012-09-24 ENCOUNTER — Ambulatory Visit (INDEPENDENT_AMBULATORY_CARE_PROVIDER_SITE_OTHER): Payer: Medicare Other | Admitting: Internal Medicine

## 2012-09-24 DIAGNOSIS — K7460 Unspecified cirrhosis of liver: Secondary | ICD-10-CM

## 2012-09-24 DIAGNOSIS — Z23 Encounter for immunization: Secondary | ICD-10-CM

## 2012-10-06 DIAGNOSIS — IMO0002 Reserved for concepts with insufficient information to code with codable children: Secondary | ICD-10-CM

## 2012-10-06 HISTORY — DX: Reserved for concepts with insufficient information to code with codable children: IMO0002

## 2012-10-08 ENCOUNTER — Other Ambulatory Visit: Payer: Self-pay | Admitting: Cardiology

## 2012-10-15 ENCOUNTER — Ambulatory Visit (INDEPENDENT_AMBULATORY_CARE_PROVIDER_SITE_OTHER): Payer: Medicare Other | Admitting: Internal Medicine

## 2012-10-15 DIAGNOSIS — Z23 Encounter for immunization: Secondary | ICD-10-CM

## 2012-10-15 DIAGNOSIS — K7460 Unspecified cirrhosis of liver: Secondary | ICD-10-CM

## 2012-10-16 ENCOUNTER — Other Ambulatory Visit (INDEPENDENT_AMBULATORY_CARE_PROVIDER_SITE_OTHER): Payer: Medicare Other

## 2012-10-16 ENCOUNTER — Ambulatory Visit (AMBULATORY_SURGERY_CENTER): Payer: Medicare Other | Admitting: Internal Medicine

## 2012-10-16 ENCOUNTER — Encounter: Payer: Self-pay | Admitting: Internal Medicine

## 2012-10-16 VITALS — BP 138/75 | HR 59 | Temp 97.4°F | Resp 25 | Ht 67.0 in | Wt 146.0 lb

## 2012-10-16 DIAGNOSIS — K259 Gastric ulcer, unspecified as acute or chronic, without hemorrhage or perforation: Secondary | ICD-10-CM

## 2012-10-16 DIAGNOSIS — K315 Obstruction of duodenum: Secondary | ICD-10-CM

## 2012-10-16 DIAGNOSIS — K2980 Duodenitis without bleeding: Secondary | ICD-10-CM

## 2012-10-16 DIAGNOSIS — I8500 Esophageal varices without bleeding: Secondary | ICD-10-CM

## 2012-10-16 DIAGNOSIS — K7460 Unspecified cirrhosis of liver: Secondary | ICD-10-CM

## 2012-10-16 LAB — CBC WITH DIFFERENTIAL/PLATELET
Basophils Relative: 0.5 % (ref 0.0–3.0)
Eosinophils Absolute: 0.2 10*3/uL (ref 0.0–0.7)
Eosinophils Relative: 4.6 % (ref 0.0–5.0)
HCT: 30.6 % — ABNORMAL LOW (ref 39.0–52.0)
Lymphs Abs: 1.2 10*3/uL (ref 0.7–4.0)
MCHC: 33.8 g/dL (ref 30.0–36.0)
MCV: 97.2 fl (ref 78.0–100.0)
Monocytes Absolute: 0.5 10*3/uL (ref 0.1–1.0)
Platelets: 86 10*3/uL — ABNORMAL LOW (ref 150.0–400.0)
RBC: 3.15 Mil/uL — ABNORMAL LOW (ref 4.22–5.81)
WBC: 3.9 10*3/uL — ABNORMAL LOW (ref 4.5–10.5)

## 2012-10-16 MED ORDER — SODIUM CHLORIDE 0.9 % IV SOLN: 500.0000 mL | INTRAVENOUS | Status: DC

## 2012-10-16 NOTE — Progress Notes (Signed)
Called to room to assist during endoscopic procedure.  Patient ID and intended procedure confirmed with present staff. Received instructions for my participation in the procedure from the performing physician.  

## 2012-10-16 NOTE — Patient Instructions (Addendum)
You do have swollen veins in the esophagus called esophageal varices. They are not large and do not appear to be at great risk of rupture but that is always a possibility. Please avoid straining and lifting heavy objects as that can cause pressure changes in these veins and rupture. i will explain more at an office visit.  You also have a large stomach ulcer that is probably from the diclofenac. YOU NEED TO STOP THE DICLOFENAC AND ADD ZEGERID MEDICINE (samples from my office) at bedtime. ALSO STOP ASPIRIN FOR NOW.  There is also a narrow area in the duodenum - a stricture.  I will have my staff call the results of your bio[psies and further plans.  If you have vomiting of blood or dark and tarry stools, please call or go to the emergency room.  Thank you for choosing me and [facility].  Iva Boop, MD, FACG YOU HAD AN ENDOSCOPIC PROCEDURE TODAY AT THE [facility]: Refer to the procedure report that was given to you for any specific questions about what was found during the examination.  If the procedure report does not answer your questions, please call your gastroenterologist to clarify.  If you requested that your care partner not be given the details of your procedure findings, then the procedure report has been included in a sealed envelope for you to review at your convenience later.  YOU SHOULD EXPECT: Some feelings of bloating in the abdomen. Passage of more gas than usual.  Walking can help get rid of the air that was put into your GI tract during the procedure and reduce the bloating. If you had a lower endoscopy (such as a colonoscopy or flexible sigmoidoscopy) you may notice spotting of blood in your stool or on the toilet paper. If you underwent a bowel prep for your procedure, then you may not have a normal bowel movement for a few days.  DIET: Your first meal following the procedure should be a light meal and then it is ok to progress to your normal  diet.  A half-sandwich or bowl of soup is an example of a good first meal.  Heavy or fried foods are harder to digest and may make you feel nauseous or bloated.  Likewise meals heavy in dairy and vegetables can cause extra gas to form and this can also increase the bloating.  Drink plenty of fluids but you should avoid alcoholic beverages for 24 hours.  ACTIVITY: Your care partner should take you home directly after the procedure.  You should plan to take it easy, moving slowly for the rest of the day.  You can resume normal activity the day after the procedure however you should NOT DRIVE or use heavy machinery for 24 hours (because of the sedation medicines used during the test).    SYMPTOMS TO REPORT IMMEDIATELY: A gastroenterologist can be reached at any hour.  During normal business hours, 8:30 AM to 5:00 PM Monday through Friday, call [phone].  After hours and on weekends, please call the GI answering service at [phone] who will take a message and have the physician on call contact you.   Following lower endoscopy (colonoscopy or flexible sigmoidoscopy):  Excessive amounts of blood in the stool  Significant tenderness or worsening of abdominal pains  Swelling of the abdomen that is new, acute  Fever of 100F or higher  Following upper endoscopy (EGD)  Vomiting of blood or coffee ground material  New chest pain or pain  under the shoulder blades  Painful or persistently difficult swallowing  New shortness of breath  Fever of 100F or higher  Black, tarry-looking stools  FOLLOW UP: If any biopsies were taken you will be contacted by phone or by letter within the next 1-3 weeks.  Call your gastroenterologist if you have not heard about the biopsies in 3 weeks.  Our staff will call the home number listed on your records the next business day following your procedure to check on you and address any questions or concerns that you may have at that time regarding the information  given to you following your procedure. This is a courtesy call and so if there is no answer at the home number and we have not heard from you through the emergency physician on call, we will assume that you have returned to your regular daily activities without incident.  SIGNATURES/CONFIDENTIALITY: You and/or your care partner have signed paperwork which will be entered into your electronic medical record.  These signatures attest to the fact that that the information above on your After Visit Summary has been reviewed and is understood.  Full responsibility of the confidentiality of this discharge information lies with you and/or your care-partner.

## 2012-10-16 NOTE — Op Note (Signed)
[facility] [address] [place], [address]   ENDOSCOPY PROCEDURE REPORT  PATIENT: Kirk, [name]  MR#: [id number] BIRTHDATE: January 19, 1927 , 85  yrs. old GENDER: Male ENDOSCOPIST: Iva Boop, MD, [facility] PROCEDURE DATE:  10/16/2012 PROCEDURE:  EGD w/ biopsy ASA CLASS:     Class III INDICATIONS:  Screening for varices. MEDICATIONS: propofol (Diprivan) 150mg  IV, MAC sedation, administered by CRNA, and These medications were titrated to patient response per physician's verbal order TOPICAL ANESTHETIC: none  DESCRIPTION OF PROCEDURE: After the risks benefits and alternatives of the procedure were thoroughly explained, informed consent was obtained.  The LB [id number]-[id number] endoscope was introduced through the mouth and advanced to the second portion of the duodenum. Without limitations.  The instrument was slowly withdrawn as the mucosa was fully examined.        ESOPHAGUS: There were 3 columns of small varices in the distal and middle third of the esophagus.  The varices were not actively bleeding and no stigmata of bleeding seen.  STOMACH: A large round and deep ulcer  (1.5 cm) with surrounding edema was found at the pylorus.  Biopsies were taken at edge of the ulcer and at the center of the ulcer.   Mild portal hypertensive gastropathy was found in the gastric fundus and gastric body.  DUODENUM: A medium sized and traversable acquired stenosis was found in the 2nd part of the duodenum.  Multiple biopsies were performed using cold forceps.  Sample sent for histology.  The remainder of the upper endoscopy exam was otherwise normal. Retroflexed views revealed no abnormalities.     The scope was then withdrawn from the patient and the procedure completed.  COMPLICATIONS: There were no complications. ENDOSCOPIC IMPRESSION: 1.   There were 3 columns of small varices in the distal and middle third of the esophagus; The varices were 2.   Large ulcer was  found at the pylorus 3.   Portal hypertensive gastropathy was found in the gastric fundus and gastric body 4.   Acquired stenosis was found in the 2nd part of the duodenum; multiple biopsies 5.   The remainder of the upper endoscopy exam was otherwise normal  RECOMMENDATIONS: Stop diclofenac and hold aspirin add Zegerid at bedtime will call results    eSigned:  Iva Boop, MD, [facility] 10/16/2012 4:32 PM   HQ:[id number] Jacky Kindle, MD and The Patient  PATIENT NAME:  Kirk, [name] MR#: [id number]

## 2012-10-16 NOTE — Progress Notes (Signed)
Patient did not experience any of the following events: a burn prior to discharge; a fall within the facility; wrong site/side/patient/procedure/implant event; or a hospital transfer or hospital admission upon discharge from the facility. (G8907) Patient did not have preoperative order for IV antibiotic SSI prophylaxis. (G8918)  

## 2012-10-17 ENCOUNTER — Telehealth: Payer: Self-pay | Admitting: *Deleted

## 2012-10-17 NOTE — Telephone Encounter (Signed)
  Follow up Call-  Call back number 10/16/2012  Post procedure Call Back phone  # [phone]  Permission to leave phone message Yes     Patient questions:  Do you have a fever, pain , or abdominal swelling? no Pain Score  0 *  Have you tolerated food without any problems? yes  Have you been able to return to your normal activities? yes  Do you have any questions about your discharge instructions: Diet   no Medications  no Follow up visit  no  Do you have questions or concerns about your Care? no  Actions: * If pain score is 4 or above: No action needed, pain <4.

## 2012-10-17 NOTE — Telephone Encounter (Signed)
  Follow up Call-  Call back number 10/16/2012  Post procedure Call Back phone  # [phone]  Permission to leave phone message Yes     Patient questions:  Do you have a fever, pain , or abdominal swelling? no Pain Score  0 *  Have you tolerated food without any problems? yes  Have you been able to return to your normal activities? yes  Do you have any questions about your discharge instructions: Diet   no Medications  no Follow up visit  no  Do you have questions or concerns about your Care? no  Actions: * If pain score is 4 or above: No action needed, pain <4.   

## 2012-10-22 NOTE — Progress Notes (Signed)
Quick Note:  Office  Ulcer is benign as ius stricture in duodenum Stay on current meds Stay off diclofenac See me in early January Hgb is slightly low at 10.9 If he develops melena needs to call  [facility] no recall or letter ______

## 2012-10-23 ENCOUNTER — Ambulatory Visit (INDEPENDENT_AMBULATORY_CARE_PROVIDER_SITE_OTHER): Payer: Medicare Other | Admitting: Cardiology

## 2012-10-23 ENCOUNTER — Encounter: Payer: Self-pay | Admitting: Cardiology

## 2012-10-23 VITALS — BP 137/82 | HR 64 | Ht 67.0 in | Wt 141.0 lb

## 2012-10-23 DIAGNOSIS — R0602 Shortness of breath: Secondary | ICD-10-CM

## 2012-10-23 DIAGNOSIS — E785 Hyperlipidemia, unspecified: Secondary | ICD-10-CM

## 2012-10-23 DIAGNOSIS — I2589 Other forms of chronic ischemic heart disease: Secondary | ICD-10-CM

## 2012-10-23 DIAGNOSIS — I2510 Atherosclerotic heart disease of native coronary artery without angina pectoris: Secondary | ICD-10-CM

## 2012-10-23 DIAGNOSIS — I4891 Unspecified atrial fibrillation: Secondary | ICD-10-CM

## 2012-10-23 DIAGNOSIS — I255 Ischemic cardiomyopathy: Secondary | ICD-10-CM

## 2012-10-23 DIAGNOSIS — I5022 Chronic systolic (congestive) heart failure: Secondary | ICD-10-CM

## 2012-10-23 MED ORDER — LOSARTAN POTASSIUM 50 MG PO TABS: 50.0000 mg | ORAL_TABLET | Freq: Two times a day (BID) | ORAL | Status: DC

## 2012-10-23 NOTE — Patient Instructions (Addendum)
Increase lasix(furosemide) to 20mg  daily.  Increase losartan to 50mg  two times a day.   Your physician recommends that you return for lab work in: 10 days--BMET/BNP.  Your physician recommends that you schedule a follow-up appointment in: 2 months with Dr Shirlee Latch.

## 2012-10-23 NOTE — Progress Notes (Signed)
Patient ID: Kirk [name], male   DOB: [birth year], 78 y.o.   MRN: [id number] PCP: Dr. Jacky Kindle  78 yo with history of CAD s/p CABG, ischemic CMP, and chronic epigastric pain with idiopathic cirrhosis presents for cardiology followup.   He is stable symptomatically.  He does not note dyspnea walking on flat ground but is short of breath walking up a hill or stairs.  He has stable lower extremity edema.  No chest pain.  No tachypalpitations.  He has been taking lasix 20 mg every other day.   Since I last saw him, he has been following with Dr. Leone Payor for liver evaluation.  Imaging has been suggestive of cirrhosis.  Workup for viral hepatitis, autoimmune hepatitis, and hemochromatosis was negative.  He has no history of heavy ETOH intake.  He had an EGD recently that showed esophageal varices and a pyloric-area ulcer.  He denies melena or BRBPR.  His aspirin was stopped after the ulcer was found and he was begun on Zegerid.  His statin has also been stopped due to elevated transaminases.  He stopped his Coreg because even at low dose it gave him weakness and fatigue.   Labs (6/12): LDL 92, HDl 60, AST 64, ALT 67 Labs (11/12): K 4.4, creatinine 0.8 Labs (3/13): AST 63, ALT 54, BNP 277, K 4.1, creatinine 1.1 Labs (4/13): LDL 80, HDL 43, AST 33, ALT 22, K 4.6, creatinine 1.1, proBNP 320 Labs (5/13): K 4.2, creatinine 1.0 Labs (4/13): LDL 80, HDL 43 Labs (8/13): BNP 193 Labs (9/13): K 4.6, creatinine 0.9, AST 115, ALT 58 Labs (12/13): HCT 30.6  ECG: NSR, LVH with repolarization, old inferior MI  PMH: 1. GERD 2. H/o pancreatitis: post-ERCP.  3. Chronic epigastric pain with dilated common bile duct 4. Cirrhosis: Negative workup for viral hepatitis, autoimmune hepatitis, hemochromatosis.  Never heavy ETOH.  Suspect NASH.  EGD (12/13) with esophageal varices and portal hypertensive gastropathy.  5. CAD: MI 1999.  CABG 2005 with LIMA-LAD, SVG-D, SVG-OM, SVG-PDA. Myoview 2010 with EF 47%, fixed  lateral defect.  6. Atrial fibrillation: post-op after CABG in 2005.  Also had in setting of post-ERCP pancreatitis in 2011.  Not on anticoagulation.  7. Ischemic cardiomyopathy: Echo (10/12) with EF 40-45%, basal to mid posterior akinesis, grade II diastolic dysfunction, mild to moderate MR, PASP 38 mmHg.  8. Hyperlipidemia 9. H/o ischemic colitis 10. Bradycardia with beta blockers 11. PUD: Pyloric area ulcer seen on 12/13 EGD.   SH: Lives [place] with wife, no children, occasionally smokes cigars  FH: CAD  ROS: All systems reviewed and negative except as per HPI.   Current Outpatient Prescriptions  Medication Sig Dispense Refill  . finasteride (PROSCAR) 5 MG tablet Take 5 mg by mouth daily.        Marland Kitchen HYDROcodone-acetaminophen (VICODIN) 5-500 MG per tablet Take 1 tablet by mouth as needed.        . hyoscyamine (ANASPAZ) 0.125 MG TBDP 1-2 dissolved under tongue every 4 hours as needed for abdominal pain  60 tablet  5  . pantoprazole (PROTONIX) 40 MG tablet 1 by mouth each day 30 minutes before meal as needed       . furosemide (LASIX) 20 MG tablet Take 1 tablet (20 mg total) by mouth daily.      Marland Kitchen losartan (COZAAR) 50 MG tablet Take 1 tablet (50 mg total) by mouth 2 (two) times daily.  60 tablet  3    BP 137/82  Pulse 64  Ht 5\' 7"  (1.702 m)  Wt 141 lb (63.957 kg)  BMI 22.08 kg/m2 General: NAD Neck: JVP 8-9 cm, no thyromegaly or thyroid nodule.  Lungs: Crackles at bases bilaterally CV: Nondisplaced PMI.  Heart regular S1/S2, no S3/S4, 1/6 HSM apex.  1+ edema 1/4 to knees bilaterally, L>R.  No carotid bruit.  Abdomen: Soft, nontender, no hepatosplenomegaly, no distention.  Neurologic: Alert and oriented x 3.  Psych: Normal affect. Extremities: No clubbing or cyanosis.  Assessment/Plan:  Systolic CHF, chronic  NYHA class II symptoms. EF 40-45% on last echo. He does appear volume overloaded today.  - He is taking Lasix 20 mg every other.  Increase to 20 mg  daily.  - Increase losartan to 50 mg bid.  - He has been unable to tolerate beta blockers.    - BMET/BNP in 10 days.   Hyperlipidemia  Has CAD, would ideally be on statin.  However, pravastatin was stopped due to elevated LFTs in the setting of cirrhosis.  I will talk to Dr. Leone Payor about whether/when it would be safe to restart a low dose of statin, following LFTs closely.   CAD  Stable with no chest pain. He is off aspirin since ulcer was found on EGD.  No melena or BRBPR.  He is anemic.  I will talk with Dr. Leone Payor about when he thinks it would be safe to restart ASA 81. Patient is now being treated with Zegerid for his ulcer.  Atrial fibrillation Two prior episodes of atrial fibrillation in the setting of significant prior stressors. Last episode in 2011 with pancreatitis. He remains in NSR today. He is not on coumadin. If atrial fibrillation is documented again in the future, we will need to consider anticoagulation due to high CHADSVASC score.  Followup in 2 months.   76 2:01 PM

## 2012-10-25 ENCOUNTER — Telehealth: Payer: Self-pay | Admitting: *Deleted

## 2012-10-25 ENCOUNTER — Telehealth: Payer: Self-pay | Admitting: Internal Medicine

## 2012-10-25 DIAGNOSIS — D649 Anemia, unspecified: Secondary | ICD-10-CM

## 2012-10-25 DIAGNOSIS — E785 Hyperlipidemia, unspecified: Secondary | ICD-10-CM

## 2012-10-25 NOTE — Telephone Encounter (Signed)
I reviewed pathology and Hgb results He needs an appointment in mid January with CBC a few days before  He needs more samples of Zegerid to keep taking at bedtime also - please put those up front for him after you call him with appointment

## 2012-10-25 NOTE — Telephone Encounter (Signed)
10/25/12 Pt advised, verbalized understanding. Pt will return for liver profile 11/08/12. Pt will return for fasting lipid/liver profile 12/24/12 prior to appt with Dr Shirlee Latch 12/24/12.    Laurey Morale, MD    Kirk [name]    MRN: [id number] DOB: 12-Dec-1926     Pt Work: [phone] Pt Home: [phone]           Message     Please restart ASA 81 on Monday and start pravastatin 20 mg daily. OK per Dr. Leone Payor.  Needs LFTs in 2 wks on pravastatin and lipids/LFTs in 2 months.     ----- Message -----       From: Iva Boop, MD       Sent: 10/23/2012  11:24 PM         To: Laurey Morale, MD         1) 81 mg ASA ok soon - suggest next Monday ok    2) low dose pravastatin ok now    ----- Message -----       From: Laurey Morale, MD       Sent: 10/23/2012   9:32 AM         To: Iva Boop, MD         Baldo Ash,     Couple of questions for you regarding [name]:     1. When would you anticipate that he would be able to restart ASA 81? He is on Zegerid now for ulcer you found on recent EGD.  No overt bleeding.     2. Would you anticipate him being able to restart a low statin dose if LFTs monitored carefully or should we keep him off statin?            Kirk [name]

## 2012-10-28 MED ORDER — OMEPRAZOLE-SODIUM BICARBONATE 40-1100 MG PO CAPS: 1.0000 | ORAL_CAPSULE | Freq: Every day | ORAL | Status: DC

## 2012-10-28 NOTE — Telephone Encounter (Signed)
See note below

## 2012-10-28 NOTE — Telephone Encounter (Signed)
Patients wife informed that Zegerid samples up front.  Patient has appointment for Jan. 8th 2014 and he will come by Jan. 6 and get CBC.  Order put in computer.

## 2012-11-04 ENCOUNTER — Other Ambulatory Visit (INDEPENDENT_AMBULATORY_CARE_PROVIDER_SITE_OTHER): Payer: Medicare Other

## 2012-11-04 DIAGNOSIS — R0602 Shortness of breath: Secondary | ICD-10-CM

## 2012-11-04 DIAGNOSIS — I5022 Chronic systolic (congestive) heart failure: Secondary | ICD-10-CM

## 2012-11-04 DIAGNOSIS — I2510 Atherosclerotic heart disease of native coronary artery without angina pectoris: Secondary | ICD-10-CM

## 2012-11-04 LAB — BASIC METABOLIC PANEL
BUN: 11 mg/dL (ref 6–23)
Creatinine, Ser: 0.7 mg/dL (ref 0.4–1.5)
GFR: 107.24 mL/min (ref >60.00)
Glucose, Bld: 104 mg/dL — ABNORMAL HIGH (ref 70–99)

## 2012-11-04 LAB — BRAIN NATRIURETIC PEPTIDE: Pro B Natriuretic peptide (BNP): 345 pg/mL — ABNORMAL HIGH (ref 0.0–100.0)

## 2012-11-07 ENCOUNTER — Telehealth: Payer: Self-pay | Admitting: Cardiology

## 2012-11-07 NOTE — Telephone Encounter (Signed)
New Problem:    Patient's wife returned your call wanting to know if you spoke with her husband.  Please call back.

## 2012-11-07 NOTE — Telephone Encounter (Signed)
Pt. advised of recent lab results, he verbalized understanding.

## 2012-11-08 ENCOUNTER — Other Ambulatory Visit (INDEPENDENT_AMBULATORY_CARE_PROVIDER_SITE_OTHER): Payer: Medicare Other

## 2012-11-08 DIAGNOSIS — E785 Hyperlipidemia, unspecified: Secondary | ICD-10-CM

## 2012-11-08 LAB — HEPATIC FUNCTION PANEL
Albumin: 3.1 g/dL — ABNORMAL LOW (ref 3.5–5.2)
Alkaline Phosphatase: 104 U/L (ref 39–117)
Total Protein: 7.3 g/dL (ref 6.0–8.3)

## 2012-11-12 ENCOUNTER — Other Ambulatory Visit: Payer: Self-pay | Admitting: *Deleted

## 2012-11-13 ENCOUNTER — Other Ambulatory Visit (INDEPENDENT_AMBULATORY_CARE_PROVIDER_SITE_OTHER): Payer: Medicare Other

## 2012-11-13 ENCOUNTER — Ambulatory Visit (INDEPENDENT_AMBULATORY_CARE_PROVIDER_SITE_OTHER): Payer: Medicare Other | Admitting: Internal Medicine

## 2012-11-13 ENCOUNTER — Encounter: Payer: Self-pay | Admitting: Internal Medicine

## 2012-11-13 ENCOUNTER — Telehealth: Payer: Self-pay | Admitting: Internal Medicine

## 2012-11-13 VITALS — BP 142/64 | HR 64 | Ht 67.0 in | Wt 141.8 lb

## 2012-11-13 DIAGNOSIS — I8510 Secondary esophageal varices without bleeding: Secondary | ICD-10-CM

## 2012-11-13 DIAGNOSIS — D649 Anemia, unspecified: Secondary | ICD-10-CM

## 2012-11-13 DIAGNOSIS — K7460 Unspecified cirrhosis of liver: Secondary | ICD-10-CM

## 2012-11-13 DIAGNOSIS — K315 Obstruction of duodenum: Secondary | ICD-10-CM

## 2012-11-13 DIAGNOSIS — K259 Gastric ulcer, unspecified as acute or chronic, without hemorrhage or perforation: Secondary | ICD-10-CM

## 2012-11-13 LAB — CBC WITH DIFFERENTIAL/PLATELET
Eosinophils Relative: 4.3 % (ref 0.0–5.0)
HCT: 32.5 % — ABNORMAL LOW (ref 39.0–52.0)
Lymphs Abs: 1.4 10*3/uL (ref 0.7–4.0)
Monocytes Relative: 8.1 % (ref 3.0–12.0)
Platelets: 107 10*3/uL — ABNORMAL LOW (ref 150.0–400.0)
WBC: 5.2 10*3/uL (ref 4.5–10.5)

## 2012-11-13 MED ORDER — PANTOPRAZOLE SODIUM 40 MG PO TBEC: 40.0000 mg | DELAYED_RELEASE_TABLET | Freq: Every day | ORAL | Status: DC

## 2012-11-13 NOTE — Progress Notes (Addendum)
Subjective:    Patient ID: Kirk [name], male    DOB: 11-04-1927, 79 y.o.   MRN: [id number]  HPI He reports that he is better on PPI after pyloric ulcer and duodenal stricture were diagnosed. No signs of bleeding. Much less abdominal pain than in the past. He is ok off diclofenac, using hydrocodone/APAP prn. We reviewed that he has idiopathic cirrhosis with small varices but is well compensated at this time. His LFT's have returned to normal with change in statin to pravastatin. He is on low dose ASA. Still taking Zegerid samples, did not need to fill pantoprazole Rx.  Allergies  Allergen Reactions  . Erythromycin Itching and Rash    Patient allergic to all Mycins   . Penicillins Rash   Outpatient Prescriptions Prior to Visit  Medication Sig Dispense Refill  . aspirin EC 81 MG tablet Take 1 tablet (81 mg total) by mouth daily.      . finasteride (PROSCAR) 5 MG tablet Take 5 mg by mouth daily.        . furosemide (LASIX) 20 MG tablet Take 1 tablet (20 mg total) by mouth daily.      Marland Kitchen HYDROcodone-acetaminophen (VICODIN) 5-500 MG per tablet Take 1 tablet by mouth as needed.        . hyoscyamine (ANASPAZ) 0.125 MG TBDP 1-2 dissolved under tongue every 4 hours as needed for abdominal pain  60 tablet  5  . losartan (COZAAR) 50 MG tablet Take 1 tablet (50 mg total) by mouth 2 (two) times daily.  60 tablet  3  . omeprazole-sodium bicarbonate (ZEGERID) 40-1100 MG per capsule Take 1 capsule by mouth daily before breakfast.  30 capsule  0  . pravastatin (PRAVACHOL) 20 MG tablet Take 1 tablet (20 mg total) by mouth every evening.      . [DISCONTINUED] pantoprazole (PROTONIX) 40 MG tablet 1 by mouth each day 30 minutes before meal as needed        Last reviewed on 11/13/2012 11:00 AM by Iva Boop, MD Past Medical History  Diagnosis Date  . GERD (gastroesophageal reflux disease)   . Diverticulosis     s/p partial colon resection  . CAD (coronary artery disease)     s/p CABG 2005  (L-LAD, S-Dx, S-OM1, S-PDA); patient had post op AFib;  Myoview 8/10: mild fixed lateral defect; no ischemia, EF 47%  . Hyperlipemia   . Ischemic cardiomyopathy     echo 1/11: mild MR, mild LAE, EF 40-45%;  echo 10/12: EF 40-45%, IL AK, Post AK, grade 2 diast dysfxn, mild to mod MR, mild BAE, PASP 38  . Hypertension   . Arthritis   . Common bile duct dilatation   . Pancreatitis     post-ERCP 11/2009  . Colitis, ischemic   . Chronic systolic heart failure     Echo 09/05/11: mild focal basal septal hypertrophy, EF 40-45%, basal to mid IL AK, post AK, grade 2 diast dysfxn, mild to mod MR, mild BAE, PASP 38  . Atrial fibrillation     post CABG in 2005 and in setting of acute illness in 1/11 (post ERCP pancreatitis) - coumadin not felt to be indicated  . Lumbar spondylosis   . Elevated LFTs     statin d/c'd in past due to   . BPH (benign prostatic hyperplasia)   . Sinus bradycardia     on beta blockers  . Ulcer 10/2012    in pylorus   Past Surgical History  Procedure Date  .  Colon resection   . Back surgery   . Ercp 11/13/2009    dilated bile ducts, atypical cytology, repeat attempt normal pancreatogram but unsuccessful biliary cannulation  . Coronary artery bypass graft     x 4  . Angioplasty 1999  . Rotator cuff repair   . Cataract extraction   . Esophagogastroduodenoscopy   . Flexible sigmoidoscopy   . Colonoscopy     Review of Systems As above    Objective:   Physical Exam General:  NAD Eyes:   anicteric Abdomen:  soft and nontender, BS+ Ext:   no edema    Data Reviewed:   Lab Results  Component Value Date   ALT 22 11/08/2012   AST 32 11/08/2012   ALKPHOS 104 11/08/2012   BILITOT 0.6 11/08/2012     Lab Results  Component Value Date   FERRITIN 84.6 08/07/2012   Lab Results  Component Value Date   WBC 5.2 11/13/2012   HGB 10.9* 11/13/2012   HCT 32.5* 11/13/2012   MCV 96.9 11/13/2012   PLT 107.0* 11/13/2012        Assessment & Plan:   1. Cirrhosis of liver without  mention of alcohol  Idiopathic and found by imaging (lobular liver) Small esophageal varices/gastropathy Ferritin ok, ANA negative, HAV, HBV, HCV negative - getting A/B vaccines INR ok  2. Pyloric ulcer  pantoprazole (PROTONIX) 40 MG tablet, Ambulatory referral to Gastroenterology  3. Anemia  CBC with Differential  4. Duodenal stricture    5. Esophageal varices in cirrhosis     1. CBC shows stable Hgb w/ normal MCV - had normal ferritin a few months ago - suspect anemia is not blood loss or iron-deficiency but from portal htn. 2. Stay on PPI, swith to pantoprazole 40 mg now 3. EGD in February to reassess ulcer and stricture, possibly dilate stricture as may have beeen cause of chronic recurrent abdominal pain 4. ? If he needs a beta blocker for the small varices - will check and confer with Dr. Shirlee Latch if needed.  12/25/2012 He has small varices without stigmata and is Class A so would not start beta blocker per guidelines.   I appreciate the opportunity to care for this patient.  MV:HQIONGE,[id number] A, MD AND Marca Ancona, MD

## 2012-11-13 NOTE — Patient Instructions (Addendum)
You have been given a separate informational sheet regarding your tobacco use, the importance of quitting and local resources to help you quit.  You have been scheduled for an endoscopy with propofol. Please follow written instructions given to you at your visit today. If you use inhalers (even only as needed) or a CPAP machine, please bring them with you on the day of your procedure.   Your physician has requested that you go to the basement for the following lab work before leaving today: CBC/diff  We have sent the following medications to your pharmacy for you to pick up at your convenience: Pantoprazole.   Start this when you finish your Zegrid samples  Thank you for choosing me and [facility].  Iva Boop, M.D., [facility]

## 2012-11-14 DIAGNOSIS — K315 Obstruction of duodenum: Secondary | ICD-10-CM | POA: Insufficient documentation

## 2012-11-14 DIAGNOSIS — K7460 Unspecified cirrhosis of liver: Secondary | ICD-10-CM | POA: Insufficient documentation

## 2012-11-14 DIAGNOSIS — I8510 Secondary esophageal varices without bleeding: Secondary | ICD-10-CM | POA: Insufficient documentation

## 2012-11-14 NOTE — Progress Notes (Signed)
Quick Note:  Blood count is stable - mildly low  No changes at this time ______

## 2012-11-14 NOTE — Telephone Encounter (Signed)
LM to call us back

## 2012-11-14 NOTE — Telephone Encounter (Signed)
Spoke with Windell Moulding (wife) regarding mg's of Protonix .  They had just gotten 3 month supply of the 40mg 's.  I confirmed with her that Dr. Leone Payor had sent in the 40mg s to [facility].  She is going to call and tell them she already has some.

## 2012-12-02 ENCOUNTER — Other Ambulatory Visit: Payer: Self-pay

## 2012-12-02 DIAGNOSIS — K259 Gastric ulcer, unspecified as acute or chronic, without hemorrhage or perforation: Secondary | ICD-10-CM

## 2012-12-02 MED ORDER — PANTOPRAZOLE SODIUM 40 MG PO TBEC: 40.0000 mg | DELAYED_RELEASE_TABLET | Freq: Every day | ORAL | Status: DC

## 2012-12-02 NOTE — Telephone Encounter (Signed)
Spoke to [place] at [facility] to let them know we are sending in rx to [facility] for his Protonix , we had sent it to [facility] 11/13/2012.

## 2012-12-06 ENCOUNTER — Other Ambulatory Visit: Payer: Self-pay

## 2012-12-06 DIAGNOSIS — K259 Gastric ulcer, unspecified as acute or chronic, without hemorrhage or perforation: Secondary | ICD-10-CM

## 2012-12-06 MED ORDER — PANTOPRAZOLE SODIUM 40 MG PO TBEC: DELAYED_RELEASE_TABLET | ORAL | Status: DC

## 2012-12-06 NOTE — Telephone Encounter (Signed)
Spoke to patients wife regarding refilling his protonix.  We sent it on1/[facility]/14 to [facility].  Spoke to pharmacist they will put that rx on hold.  Re-sent rx of Protonix to [facility].

## 2012-12-24 ENCOUNTER — Other Ambulatory Visit (INDEPENDENT_AMBULATORY_CARE_PROVIDER_SITE_OTHER): Payer: Medicare Other

## 2012-12-24 DIAGNOSIS — E785 Hyperlipidemia, unspecified: Secondary | ICD-10-CM

## 2012-12-24 LAB — HEPATIC FUNCTION PANEL
ALT: 24 U/L (ref 0–53)
AST: 37 U/L (ref 0–37)
Bilirubin, Direct: 0.1 mg/dL (ref 0.0–0.3)
Total Bilirubin: 0.7 mg/dL (ref 0.3–1.2)

## 2012-12-24 LAB — LIPID PANEL
Cholesterol: 158 mg/dL (ref 0–200)
LDL Cholesterol: 99 mg/dL (ref 0–99)
Total CHOL/HDL Ratio: 3

## 2012-12-26 ENCOUNTER — Encounter: Payer: Self-pay | Admitting: Cardiology

## 2012-12-26 ENCOUNTER — Ambulatory Visit (INDEPENDENT_AMBULATORY_CARE_PROVIDER_SITE_OTHER): Payer: Medicare Other | Admitting: Cardiology

## 2012-12-26 VITALS — BP 130/68 | HR 62 | Ht 67.0 in | Wt 143.4 lb

## 2012-12-26 DIAGNOSIS — I5022 Chronic systolic (congestive) heart failure: Secondary | ICD-10-CM

## 2012-12-26 DIAGNOSIS — E785 Hyperlipidemia, unspecified: Secondary | ICD-10-CM

## 2012-12-26 DIAGNOSIS — I4891 Unspecified atrial fibrillation: Secondary | ICD-10-CM

## 2012-12-26 DIAGNOSIS — I2510 Atherosclerotic heart disease of native coronary artery without angina pectoris: Secondary | ICD-10-CM

## 2012-12-26 MED ORDER — BISOPROLOL FUMARATE 5 MG PO TABS: 2.5000 mg | ORAL_TABLET | Freq: Every day | ORAL | Status: DC

## 2012-12-26 MED ORDER — PRAVASTATIN SODIUM 40 MG PO TABS: 40.0000 mg | ORAL_TABLET | Freq: Every evening | ORAL | Status: DC

## 2012-12-26 NOTE — Patient Instructions (Addendum)
Your physician recommends that you return for lab work in: 2 months.  (lfts, lipid, bmet)  Your physician has recommended you make the following change in your medication: INCREASE your Pravastatin to 40 mg daily, START Bisoprolol 2.5 mg daily, INCREASE your lasix to 20 mg EVERY day  Your physician recommends that you schedule a follow-up appointment in: 4 months.    Get support hose and wear it daily while you are on your feet

## 2012-12-29 NOTE — Progress Notes (Signed)
Patient ID: Kirk [name], male   DOB: 02/06/1927, 79 y.o.   MRN: [id number] PCP: Dr. Jacky Kindle  79 yo with history of CAD s/p CABG, ischemic CMP, and chronic epigastric pain with idiopathic cirrhosis presents for cardiology followup.   He is stable symptomatically.  He does not note dyspnea walking on flat ground but is short of breath walking up a hill or stairs.  He was able to shovel snow last week without problem.  He has stable lower extremity edema.  No chest pain.  No tachypalpitations.  He has been taking lasix 20 mg every other day.   He sees Dr. Leone Payor for liver evaluation.  Imaging has been suggestive of cirrhosis.  Workup for viral hepatitis, autoimmune hepatitis, and hemochromatosis was negative.  He has no history of heavy ETOH intake.  He has esophageal varices and a pyloric-area ulcer seen on last EGD.  He is on a low dose of statin with normal LFTs.    Labs (6/12): LDL 92, HDl 60, AST 64, ALT 67 Labs (11/12): K 4.4, creatinine 0.8 Labs (3/13): AST 63, ALT 54, BNP 277, K 4.1, creatinine 1.1 Labs (4/13): LDL 80, HDL 43, AST 33, ALT 22, K 4.6, creatinine 1.1, proBNP 320 Labs (5/13): K 4.2, creatinine 1.0 Labs (4/13): LDL 80, HDL 43 Labs (8/13): BNP 193 Labs (9/13): K 4.6, creatinine 0.9, AST 115, ALT 58 Labs (12/13): HCT 30.6, K 3.9, creatinine 0.7, BNP 345 Labs (2/14): LDL 99, HDL 46, LFTs normal  ECG: NSR, old anterolateral MI, old inferior MI  PMH: 1. GERD 2. H/o pancreatitis: post-ERCP.  3. Chronic epigastric pain with dilated common bile duct 4. Cirrhosis: Negative workup for viral hepatitis, autoimmune hepatitis, hemochromatosis.  Never heavy ETOH.  Suspect NASH.  EGD (12/13) with esophageal varices and portal hypertensive gastropathy.  5. CAD: MI 1999.  CABG 2005 with LIMA-LAD, SVG-D, SVG-OM, SVG-PDA. Myoview 2010 with EF 47%, fixed lateral defect.  6. Atrial fibrillation: post-op after CABG in 2005.  Also had in setting of post-ERCP pancreatitis in 2011.  Not on  anticoagulation.  7. Ischemic cardiomyopathy: Echo (10/12) with EF 40-45%, basal to mid posterior akinesis, grade II diastolic dysfunction, mild to moderate MR, PASP 38 mmHg.  8. Hyperlipidemia 9. H/o ischemic colitis 10. Bradycardia with beta blockers 11. PUD: Pyloric area ulcer seen on 12/13 EGD.   SH: Lives on farm [employer] [place] with wife, no children, occasionally smokes cigars  FH: CAD  ROS: All systems reviewed and negative except as per HPI.   Current Outpatient Prescriptions  Medication Sig Dispense Refill  . aspirin EC 81 MG tablet Take 1 tablet (81 mg total) by mouth daily.      . finasteride (PROSCAR) 5 MG tablet Take 5 mg by mouth daily.        . furosemide (LASIX) 20 MG tablet Take 1 tablet (20 mg total) by mouth daily.      Marland Kitchen HYDROcodone-acetaminophen (VICODIN) 5-500 MG per tablet Take 1 tablet by mouth as needed.        . hyoscyamine (ANASPAZ) 0.125 MG TBDP 1-2 dissolved under tongue every 4 hours as needed for abdominal pain  60 tablet  5  . losartan (COZAAR) 50 MG tablet Take 1 tablet (50 mg total) by mouth 2 (two) times daily.  60 tablet  3  . omeprazole-sodium bicarbonate (ZEGERID) 40-1100 MG per capsule Take 1 capsule by mouth daily before breakfast.  30 capsule  0  . pravastatin (PRAVACHOL) 40 MG tablet Take 1  tablet (40 mg total) by mouth every evening.  90 tablet  1  . bisoprolol (ZEBETA) 5 MG tablet Take 0.5 tablets (2.5 mg total) by mouth daily.  45 tablet  1  . pantoprazole (PROTONIX) 40 MG tablet 1 by mouth each day 30 minutes before meal  90 tablet  3   No current facility-administered medications for this visit.    BP 130/68  Pulse 62  Ht 5\' 7"  (1.702 m)  Wt 143 lb 6.4 oz (65.046 kg)  BMI 22.45 kg/m2  SpO2 99% General: NAD Neck: No JVD, no thyromegaly or thyroid nodule.  Lungs: Crackles at bases bilaterally CV: Nondisplaced PMI.  Heart regular S1/S2, no S3/S4, 1/6 HSM apex.  1+ edema 1/2 to knees bilaterally, L>R.  No carotid bruit.  Abdomen:  Soft, nontender, no hepatosplenomegaly, no distention.  Neurologic: Alert and oriented x 3.  Psych: Normal affect. Extremities: No clubbing or cyanosis.  Assessment/Plan:  Systolic CHF, chronic  NYHA class II symptoms. EF 40-45% on last echo. No JVD noted but prominent lower extremity edema and last BNP was elevated. - He is still taking Lasix at 20 mg every other day.  Increase to 20 mg daily.  - Continue losartan - He was unable to tolerate Coreg in the past due to fatigue.  I will try him on bisoprolol 2.5 mg daily.     - BMET/BNP in 2 months   - He will wear compression stockings.  Hyperlipidemia  Has CAD so on pravastatin.  Started at low dose due to cirrhosis.  LFTs have been normal.  I will increase pravastatin to 40 mg daily with lipids/LFTs in 2 months.   CAD  Stable with no chest pain. He has tolerate ASA 81 with no GI bleeding. As above, increasing pravastatin.  Atrial fibrillation Two prior episodes of atrial fibrillation in the setting of significant prior stressors. Last episode in 2011 with pancreatitis. He remains in NSR today. He is not on coumadin. If atrial fibrillation is documented again in the future, we will need to consider anticoagulation due to high CHADSVASC score.  Followup in 4 months.   Marca Ancona 12/29/2012 8:48 PM

## 2013-01-02 ENCOUNTER — Telehealth: Payer: Self-pay

## 2013-01-02 NOTE — Telephone Encounter (Signed)
Mrs Lince states that Mr Rhinesmith has been sleeping a lot, pacing the floor and feels strange since starting the bisoprolol.

## 2013-01-02 NOTE — Telephone Encounter (Signed)
Have him try to stick with it for a little longer.  Hopefully if this is medication he will get used to it and symptoms will subside.

## 2013-01-02 NOTE — Telephone Encounter (Signed)
Kirk [name] was notified and agrees.

## 2013-01-02 NOTE — Telephone Encounter (Signed)
Mrs Gosser states that Mr Glance has been sleeping a lot, pacing the floor and feels strange since starting the bisoprolol.   

## 2013-01-07 ENCOUNTER — Encounter: Payer: Self-pay | Admitting: Internal Medicine

## 2013-01-07 ENCOUNTER — Encounter: Payer: Medicare Other | Admitting: Internal Medicine

## 2013-01-07 ENCOUNTER — Ambulatory Visit (AMBULATORY_SURGERY_CENTER): Payer: Medicare Other | Admitting: Internal Medicine

## 2013-01-07 ENCOUNTER — Other Ambulatory Visit (INDEPENDENT_AMBULATORY_CARE_PROVIDER_SITE_OTHER): Payer: Medicare Other

## 2013-01-07 VITALS — BP 140/69 | HR 62 | Temp 96.9°F | Resp 32 | Ht 67.0 in | Wt 141.0 lb

## 2013-01-07 DIAGNOSIS — K259 Gastric ulcer, unspecified as acute or chronic, without hemorrhage or perforation: Secondary | ICD-10-CM

## 2013-01-07 LAB — HEPATIC FUNCTION PANEL
Alkaline Phosphatase: 81 U/L (ref 39–117)
Bilirubin, Direct: 0.2 mg/dL (ref 0.0–0.3)
Total Bilirubin: 0.7 mg/dL (ref 0.3–1.2)
Total Protein: 6.7 g/dL (ref 6.0–8.3)

## 2013-01-07 MED ORDER — SODIUM CHLORIDE 0.9 % IV SOLN: 500.0000 mL | INTRAVENOUS | Status: DC

## 2013-01-07 NOTE — Patient Instructions (Addendum)
The ulcer is healed. Changes from cirrhosis do remain - with some dilated veins in the esophagus (varices) and what is called portal gastropathy (congestion of the stomach lining). These do bleed in some people so if you vomit blood or pass bloody or dark and tarry stools seek medical care urgently.  Please see me again in 6 months - we will place a recall and try to send a letter. See me sooner, if needed.  Thank you for choosing me and [facility].  Iva Boop, MD, FACG  YOU HAD AN ENDOSCOPIC PROCEDURE TODAY AT THE [facility]: Refer to the procedure report that was given to you for any specific questions about what was found during the examination.  If the procedure report does not answer your questions, please call your gastroenterologist to clarify.  If you requested that your care partner not be given the details of your procedure findings, then the procedure report has been included in a sealed envelope for you to review at your convenience later.  YOU SHOULD EXPECT: Some feelings of bloating in the abdomen. Passage of more gas than usual.  Walking can help get rid of the air that was put into your GI tract during the procedure and reduce the bloating. If you had a lower endoscopy (such as a colonoscopy or flexible sigmoidoscopy) you may notice spotting of blood in your stool or on the toilet paper. If you underwent a bowel prep for your procedure, then you may not have a normal bowel movement for a few days.  DIET: Your first meal following the procedure should be a light meal and then it is ok to progress to your normal diet.  A half-sandwich or bowl of soup is an example of a good first meal.  Heavy or fried foods are harder to digest and may make you feel nauseous or bloated.  Likewise meals heavy in dairy and vegetables can cause extra gas to form and this can also increase the bloating.  Drink plenty of fluids but you should avoid alcoholic beverages for 24  hours.  ACTIVITY: Your care partner should take you home directly after the procedure.  You should plan to take it easy, moving slowly for the rest of the day.  You can resume normal activity the day after the procedure however you should NOT DRIVE or use heavy machinery for 24 hours (because of the sedation medicines used during the test).    SYMPTOMS TO REPORT IMMEDIATELY: A gastroenterologist can be reached at any hour.  During normal business hours, 8:30 AM to 5:00 PM Monday through Friday, call [phone].  After hours and on weekends, please call the GI answering service at [phone] who will take a message and have the physician on call contact you.   Following upper endoscopy (EGD)  Vomiting of blood or coffee ground material  New chest pain or pain under the shoulder blades  Painful or persistently difficult swallowing  New shortness of breath  Fever of 100F or higher  Black, tarry-looking stools  FOLLOW UP: If any biopsies were taken you will be contacted by phone or by letter within the next 1-3 weeks.  Call your gastroenterologist if you have not heard about the biopsies in 3 weeks.  Our staff will call the home number listed on your records the next business day following your procedure to check on you and address any questions or concerns that you may have at that time regarding the information given  to you following your procedure. This is a courtesy call and so if there is no answer at the home number and we have not heard from you through the emergency physician on call, we will assume that you have returned to your regular daily activities without incident.  SIGNATURES/CONFIDENTIALITY: You and/or your care partner have signed paperwork which will be entered into your electronic medical record.  These signatures attest to the fact that that the information above on your After Visit Summary has been reviewed and is understood.  Full responsibility of the confidentiality  of this discharge information lies with you and/or your care-partner.  You may take up to two extra strength tylenol twice per day per Dr. Leone Payor.  We will check liver functions today due to your itching.  We will see you in the office in June.  We will send you a reminder in the mail.

## 2013-01-07 NOTE — Progress Notes (Signed)
Propofol given over incremental dosagesLidocaine-40mg IV prior to Propofol Induction 

## 2013-01-07 NOTE — Progress Notes (Signed)
Patient did not have preoperative order for IV antibiotic SSI prophylaxis. (G8918)  Patient did not experience any of the following events: a burn prior to discharge; a fall within the facility; wrong site/side/patient/procedure/implant event; or a hospital transfer or hospital admission upon discharge from the facility. (G8907)  

## 2013-01-07 NOTE — Op Note (Addendum)
[facility] [address] [place], [address]   ENDOSCOPY PROCEDURE REPORT  PATIENT: Kirk, [name]  MR#: [id number] BIRTHDATE: [birth year] , 85  yrs. old GENDER: Male ENDOSCOPIST: Iva Boop, MD, [facility] PROCEDURE DATE:  01/07/2013 PROCEDURE:  EGD, diagnostic ASA CLASS:     Class III INDICATIONS:  Surveillance - follow-up gastric ulcer and reassess duodenal stricture MEDICATIONS: propofol (Diprivan) 50mg  IV, MAC sedation, administered by CRNA, and These medications were titrated to patient response per physician's verbal order TOPICAL ANESTHETIC: Cetacaine Spray  DESCRIPTION OF PROCEDURE: After the risks benefits and alternatives of the procedure were thoroughly explained, informed consent was obtained.  The LB [id number] endoscope was introduced through the mouth and advanced to the second portion of the duodenum. Without limitations.  The instrument was slowly withdrawn as the mucosa was fully examined.        ESOPHAGUS: There were 3 columns of small varices in the distal third of the esophagus.  The varices were not actively bleeding.  no stigmata of bleeding  STOMACH: Moderate portal hypertensive gastropathy was found in the entire examined stomach.   Scaring was seen at the pylorus at site of prior ulcer.  DUODENUM: A medium sized and traversable acquired stenosis was found in the in the D1/D2 junction.  The remainder of the upper endoscopy exam was otherwise normal. Retroflexed views revealed as above.     The scope was then withdrawn from the patient and the procedure completed.  COMPLICATIONS: There were no complications. ENDOSCOPIC IMPRESSION: 1.   There were 3 columns of small varices in the distal third of the esophagus; The varices were without bleeding stigmata 2.   Portal hypertensive gastropathy was found in the entire examined stomach 3.   Scaring was seen at the pylorus - healed ulcer 4.   Acquired stenosis was found in  the in the D1/D2 junction - stricture -  more patent 5.   The remainder of the upper endoscopy exam was otherwise normal  RECOMMENDATIONS: 1.  Continue PPI - may try using diclofenac sparingly but suggest using acetaminophen 1000 mg first up to 2000 mg max/day 2.   follow-up appointment with office 6 month(s) 3.   Place office visit recall for June 2014 4.   recheck LFT's, c/o itching  REPEAT EXAM: Consider repeating in 1 year to resize varices - they are small and since Child's A have not started beta-blocker. If increasing in size consider prophylactic banding.  eSigned:  Iva Boop, MD, [facility] 01/07/2013 12:23 PM Revised: 01/07/2013 12:23 PM  [id number] Jacky Kindle, MD and The Patient  PATIENT NAME:  Kirk, [name] MR#: [id number]

## 2013-01-08 ENCOUNTER — Telehealth: Payer: Self-pay

## 2013-01-08 NOTE — Telephone Encounter (Signed)
  Follow up Call-  Call back number 01/07/2013 10/16/2012  Post procedure Call Back phone  # [phone] hm [phone]  Permission to leave phone message Yes Yes     Patient questions:  Do you have a fever, pain , or abdominal swelling? no Pain Score  0 *  Have you tolerated food without any problems? yes  Have you been able to return to your normal activities? yes  Do you have any questions about your discharge instructions: Diet   no Medications  no Follow up visit  no  Do you have questions or concerns about your Care? no  Actions: * If pain score is 4 or above: No action needed, pain <4.

## 2013-01-08 NOTE — Progress Notes (Signed)
Quick Note:  LFT's are ok Itching quite likely dry winter skin Try lotion, etc - bathe less frequently to reduce dryness See PCP for further evaluation if needed ______

## 2013-01-09 ENCOUNTER — Encounter: Payer: Self-pay | Admitting: Cardiology

## 2013-02-24 ENCOUNTER — Other Ambulatory Visit (INDEPENDENT_AMBULATORY_CARE_PROVIDER_SITE_OTHER): Payer: Medicare Other

## 2013-02-24 DIAGNOSIS — I5022 Chronic systolic (congestive) heart failure: Secondary | ICD-10-CM

## 2013-02-24 DIAGNOSIS — E785 Hyperlipidemia, unspecified: Secondary | ICD-10-CM

## 2013-02-24 LAB — HEPATIC FUNCTION PANEL
AST: 96 U/L — ABNORMAL HIGH (ref 0–37)
Albumin: 3.1 g/dL — ABNORMAL LOW (ref 3.5–5.2)
Alkaline Phosphatase: 105 U/L (ref 39–117)
Total Protein: 6.6 g/dL (ref 6.0–8.3)

## 2013-02-24 LAB — BASIC METABOLIC PANEL
BUN: 13 mg/dL (ref 6–23)
CO2: 29 mEq/L (ref 19–32)
Chloride: 105 mEq/L (ref 96–112)
Creatinine, Ser: 0.8 mg/dL (ref 0.4–1.5)
Glucose, Bld: 91 mg/dL (ref 70–99)

## 2013-02-24 LAB — LIPID PANEL
LDL Cholesterol: 72 mg/dL (ref 0–99)
Total CHOL/HDL Ratio: 3
VLDL: 10 mg/dL (ref 0.0–40.0)

## 2013-02-26 ENCOUNTER — Other Ambulatory Visit: Payer: Self-pay | Admitting: *Deleted

## 2013-02-26 DIAGNOSIS — E785 Hyperlipidemia, unspecified: Secondary | ICD-10-CM

## 2013-02-26 DIAGNOSIS — I5022 Chronic systolic (congestive) heart failure: Secondary | ICD-10-CM

## 2013-03-12 ENCOUNTER — Other Ambulatory Visit (INDEPENDENT_AMBULATORY_CARE_PROVIDER_SITE_OTHER): Payer: Medicare Other

## 2013-03-12 DIAGNOSIS — I5022 Chronic systolic (congestive) heart failure: Secondary | ICD-10-CM

## 2013-03-12 LAB — HEPATIC FUNCTION PANEL: Albumin: 3 g/dL — ABNORMAL LOW (ref 3.5–5.2)

## 2013-03-14 ENCOUNTER — Telehealth: Payer: Self-pay | Admitting: *Deleted

## 2013-03-14 NOTE — Telephone Encounter (Signed)
Notified patient's wife of lab results and that there are no changes in therapy at this time.

## 2013-04-08 ENCOUNTER — Encounter: Payer: Self-pay | Admitting: Internal Medicine

## 2013-04-28 ENCOUNTER — Encounter: Payer: Self-pay | Admitting: Cardiology

## 2013-04-28 ENCOUNTER — Ambulatory Visit (INDEPENDENT_AMBULATORY_CARE_PROVIDER_SITE_OTHER): Payer: Medicare Other | Admitting: Cardiology

## 2013-04-28 VITALS — BP 124/64 | HR 56 | Ht 67.0 in | Wt 142.0 lb

## 2013-04-28 DIAGNOSIS — I2510 Atherosclerotic heart disease of native coronary artery without angina pectoris: Secondary | ICD-10-CM

## 2013-04-28 DIAGNOSIS — I5022 Chronic systolic (congestive) heart failure: Secondary | ICD-10-CM

## 2013-04-28 DIAGNOSIS — K7460 Unspecified cirrhosis of liver: Secondary | ICD-10-CM

## 2013-04-28 DIAGNOSIS — I4891 Unspecified atrial fibrillation: Secondary | ICD-10-CM

## 2013-04-28 DIAGNOSIS — I509 Heart failure, unspecified: Secondary | ICD-10-CM

## 2013-04-28 NOTE — Patient Instructions (Addendum)
Take lasix(furosemide) 20mg  daily.  Your physician recommends that you return for lab work in: 2 weeks--BMET/BNP.    Your physician recommends that you schedule a follow-up appointment in: 2 months with PA/NP.   Your physician wants you to follow-up in: 4 months with Dr Shirlee Latch. (October 2014).  You will receive a reminder letter in the mail two months in advance. If you don't receive a letter, please call our office to schedule the follow-up appointment.

## 2013-04-28 NOTE — Progress Notes (Signed)
Patient ID: Kirk [name], male   DOB: 08/17/1927, 79 y.o.   MRN: [id number] PCP: Dr. Jacky Kindle  79 yo with history of CAD s/p CABG, ischemic CMP, and chronic epigastric pain with idiopathic cirrhosis presents for cardiology followup.   He is stable symptomatically.  He does not note dyspnea walking on flat ground but is short of breath walking up a hill or stairs.  He walks daily for 1-1.5 miles.  No bendopnea or orthopnea.  No chest pain.  No tachypalpitations.  He has tolerated bisoprolol much better than carvedilol but says that it does make him a little tired.    Recently, his ankles have started swelling again.  At last appointment, I asked him to take Lasix every day.  He did this for a while but lately is taking Lasix only a couple of times a week.    ECG: NSR, LVH with repolarization abnormality, old inferior MI  Labs (6/12): LDL 92, HDl 60, AST 64, ALT 67 Labs (11/12): K 4.4, creatinine 0.8 Labs (3/13): AST 63, ALT 54, BNP 277, K 4.1, creatinine 1.1 Labs (4/13): LDL 80, HDL 43, AST 33, ALT 22, K 4.6, creatinine 1.1, proBNP 320 Labs (5/13): K 4.2, creatinine 1.0 Labs (4/13): LDL 80, HDL 43 Labs (8/13): BNP 193 Labs (9/13): K 4.6, creatinine 0.9, AST 115, ALT 58 Labs (12/13): HCT 30.6, K 3.9, creatinine 0.7, BNP 345 Labs (2/14): LDL 99, HDL 46, LFTs normal Labs (4/09): LDL 72, HDL 47, K 4.6, creatinine 0.8 Labs (5/14): AST 77, ALT 41  PMH: 1. GERD 2. H/o pancreatitis: post-ERCP.  3. Chronic epigastric pain with dilated common bile duct 4. Cirrhosis: Negative workup for viral hepatitis, autoimmune hepatitis, hemochromatosis.  Never heavy ETOH.  Suspect NASH.  EGD (12/13) with esophageal varices and portal hypertensive gastropathy.  EGD (3/14) with 3 small esophageal varices, portal hypertensive gastropathy.  5. CAD: MI 1999.  CABG 2005 with LIMA-LAD, SVG-D, SVG-OM, SVG-PDA. Myoview 2010 with EF 47%, fixed lateral defect.  6. Atrial fibrillation: post-op after CABG in 2005.  Also  had in setting of post-ERCP pancreatitis in 2011.  Not on anticoagulation.  7. Ischemic cardiomyopathy: Echo (10/12) with EF 40-45%, basal to mid posterior akinesis, grade II diastolic dysfunction, mild to moderate MR, PASP 38 mmHg.  8. Hyperlipidemia 9. H/o ischemic colitis 10. Bradycardia with beta blockers 11. PUD: Pyloric area ulcer seen on 12/13 EGD.   SH: Lives on farm [place] with wife, no children, occasionally smokes cigars  FH: CAD  ROS: All systems reviewed and negative except as per HPI.   Current Outpatient Prescriptions  Medication Sig Dispense Refill  . aspirin EC 81 MG tablet Take 1 tablet (81 mg total) by mouth daily.      . bisoprolol (ZEBETA) 5 MG tablet Take 0.5 tablets (2.5 mg total) by mouth daily.  45 tablet  1  . finasteride (PROSCAR) 5 MG tablet Take 5 mg by mouth daily.        . furosemide (LASIX) 20 MG tablet Take 1 tablet (20 mg total) by mouth daily.      Marland Kitchen HYDROcodone-acetaminophen (VICODIN) 5-500 MG per tablet Take 1 tablet by mouth as needed.        . hyoscyamine (ANASPAZ) 0.125 MG TBDP 1-2 dissolved under tongue every 4 hours as needed for abdominal pain  60 tablet  5  . losartan (COZAAR) 50 MG tablet Take 1 tablet (50 mg total) by mouth 2 (two) times daily.  60 tablet  3  .  omeprazole-sodium bicarbonate (ZEGERID) 40-1100 MG per capsule Take 1 capsule by mouth daily before breakfast.  30 capsule  0  . pantoprazole (PROTONIX) 40 MG tablet 1 by mouth each day 30 minutes before meal  90 tablet  3  . pravastatin (PRAVACHOL) 40 MG tablet Take 1 tablet (40 mg total) by mouth every evening.  90 tablet  1   No current facility-administered medications for this visit.    BP 124/64  Pulse 56  Ht 5\' 7"  (1.702 m)  Wt 142 lb (64.411 kg)  BMI 22.24 kg/m2 General: NAD Neck: JVP 8 cm with HJR, no thyromegaly or thyroid nodule.  Lungs: Crackles at bases bilaterally CV: Nondisplaced PMI.  Heart regular S1/S2, no S3/S4, 1/6 HSM apex.  1+ edema 1/2 to  knees bilaterally, L>R.  No carotid bruit.  Abdomen: Soft, nontender, no hepatosplenomegaly, no distention.  Neurologic: Alert and oriented x 3.  Psych: Normal affect. Extremities: No clubbing or cyanosis.  Assessment/Plan:  Systolic CHF, chronic  NYHA class II symptoms. EF 40-45% on last echo. He does appear volume overloaded today.  He is taking Lasix somewhat sporadically. - He will start taking Lasix 20 mg every day.   - Continue losartan and bisoprolol.  I am not going to titrate up the bisoprolol given his fatigue with beta blocker use (though he tolerates it much better than Coreg).     - BMET/BNP in 2 weeks. - See the PA in this office in 2 months, see me in 4 months.  Hyperlipidemia  Has CAD so on pravastatin.  LFTs have been mildly elevated.  No changes to pravastatin. CAD  Stable with no chest pain. He has tolerate ASA 81 with no GI bleeding. Continue statin. Atrial fibrillation Two prior episodes of atrial fibrillation in the setting of significant prior stressors. Last episode in 2011 with pancreatitis. He remains in NSR today. He is not on coumadin. If atrial fibrillation is documented again in the future, we will need to consider anticoagulation due to high CHADSVASC score.  Marca Ancona 04/28/2013 9:40 PM

## 2013-04-30 ENCOUNTER — Ambulatory Visit: Payer: Medicare Other | Admitting: Cardiology

## 2013-05-01 ENCOUNTER — Encounter: Payer: Self-pay | Admitting: Internal Medicine

## 2013-05-01 ENCOUNTER — Other Ambulatory Visit (INDEPENDENT_AMBULATORY_CARE_PROVIDER_SITE_OTHER): Payer: Medicare Other

## 2013-05-01 ENCOUNTER — Ambulatory Visit (INDEPENDENT_AMBULATORY_CARE_PROVIDER_SITE_OTHER): Payer: Medicare Other | Admitting: Internal Medicine

## 2013-05-01 VITALS — BP 132/64 | HR 60 | Ht 67.0 in | Wt 137.0 lb

## 2013-05-01 DIAGNOSIS — K7460 Unspecified cirrhosis of liver: Secondary | ICD-10-CM

## 2013-05-01 DIAGNOSIS — R748 Abnormal levels of other serum enzymes: Secondary | ICD-10-CM

## 2013-05-01 DIAGNOSIS — K838 Other specified diseases of biliary tract: Secondary | ICD-10-CM

## 2013-05-01 DIAGNOSIS — R1013 Epigastric pain: Secondary | ICD-10-CM

## 2013-05-01 LAB — COMPREHENSIVE METABOLIC PANEL
ALT: 25 U/L (ref 0–53)
AST: 35 U/L (ref 0–37)
Alkaline Phosphatase: 127 U/L — ABNORMAL HIGH (ref 39–117)
BUN: 12 mg/dL (ref 6–23)
Calcium: 9 mg/dL (ref 8.4–10.5)
Chloride: 102 mEq/L (ref 96–112)
Creatinine, Ser: 0.8 mg/dL (ref 0.4–1.5)
Potassium: 3.8 mEq/L (ref 3.5–5.1)

## 2013-05-01 LAB — CBC WITH DIFFERENTIAL/PLATELET
Basophils Absolute: 0 10*3/uL (ref 0.0–0.1)
Basophils Relative: 0.6 % (ref 0.0–3.0)
Eosinophils Absolute: 0.2 10*3/uL (ref 0.0–0.7)
Hemoglobin: 10.9 g/dL — ABNORMAL LOW (ref 13.0–17.0)
Lymphocytes Relative: 22 % (ref 12.0–46.0)
Lymphs Abs: 1 10*3/uL (ref 0.7–4.0)
MCHC: 33.8 g/dL (ref 30.0–36.0)
MCV: 97.9 fl (ref 78.0–100.0)
Monocytes Absolute: 0.5 10*3/uL (ref 0.1–1.0)
Neutro Abs: 2.7 10*3/uL (ref 1.4–7.7)
RBC: 3.29 Mil/uL — ABNORMAL LOW (ref 4.22–5.81)
RDW: 14.8 % — ABNORMAL HIGH (ref 11.5–14.6)

## 2013-05-01 MED ORDER — HYOSCYAMINE SULFATE 0.125 MG PO TBDP: ORAL_TABLET | ORAL | Status: DC

## 2013-05-01 NOTE — Patient Instructions (Addendum)
Your physician has requested that you go to the basement for the following lab work before leaving today: CMET, CBC/diff, Amylase, Lipase  You have been scheduled for an MRI/mrcp  at [facility] on  05/08/13. Your appointment time is 9:00am. Please arrive 15 minutes prior to your appointment time for registration purposes. There is no prep for this test. However, if you have any metal in your body, have a pacemaker or defibrillator, please be sure to let your ordering physician know. This test typically takes 45 minutes to 1 hour to complete.  I appreciate the opportunity to care for you.

## 2013-05-01 NOTE — Progress Notes (Signed)
Subjective:    Patient ID: Kirk [name], male    DOB: 04-25-1927, 79 y.o.   MRN: [id number]  HPI  Patient is here for followup. He still has his intermittent epigastric pain and is out of his hyoscyamine he needs a refill. One or 2 of those does relieve the spells of intense epigastric pain. There may be occurring more frequently lately. He notices that if he walks with a stooped over bends over or old man position as he calls it he takes a pressure of his abdomen he feels somewhat better. His bowel movements are regular there is no vomiting. He has noticed a black tongue. He did use Pepto-Bismol today but does not use it regularly. He does drink a lot of coffee. Appetite is okay he says not the best.  Wt Readings from Last 3 Encounters:  05/01/13 137 lb (62.143 kg)  04/28/13 142 lb (64.411 kg)  01/07/13 141 lb (63.957 kg)    Allergies  Allergen Reactions  . Erythromycin Itching and Rash    Patient allergic to all Mycins   . Penicillins Rash   Outpatient Prescriptions Prior to Visit  Medication Sig Dispense Refill  . aspirin EC 81 MG tablet Take 1 tablet (81 mg total) by mouth daily.      . bisoprolol (ZEBETA) 5 MG tablet Take 0.5 tablets (2.5 mg total) by mouth daily.  45 tablet  1  . finasteride (PROSCAR) 5 MG tablet Take 5 mg by mouth daily.        . furosemide (LASIX) 20 MG tablet Take 1 tablet (20 mg total) by mouth daily.      Marland Kitchen HYDROcodone-acetaminophen (VICODIN) 5-500 MG per tablet Take 1 tablet by mouth as needed.        Marland Kitchen losartan (COZAAR) 50 MG tablet Take 1 tablet (50 mg total) by mouth 2 (two) times daily.  60 tablet  3  . pantoprazole (PROTONIX) 40 MG tablet 1 by mouth each day 30 minutes before meal  90 tablet  3  . pravastatin (PRAVACHOL) 40 MG tablet Take 1 tablet (40 mg total) by mouth every evening.  90 tablet  1  . hyoscyamine (ANASPAZ) 0.125 MG TBDP 1-2 dissolved under tongue every 4 hours as needed for abdominal pain  60 tablet  5  . omeprazole-sodium  bicarbonate (ZEGERID) 40-1100 MG per capsule Take 1 capsule by mouth daily before breakfast.  30 capsule  0   No facility-administered medications prior to visit.   Past Medical History  Diagnosis Date  . GERD (gastroesophageal reflux disease)   . Diverticulosis     s/p partial colon resection  . CAD (coronary artery disease)     s/p CABG 2005 (L-LAD, S-Dx, S-OM1, S-PDA); patient had post op AFib;  Myoview 8/10: mild fixed lateral defect; no ischemia, EF 47%  . Hyperlipemia   . Ischemic cardiomyopathy     echo 1/11: mild MR, mild LAE, EF 40-45%;  echo 10/12: EF 40-45%, IL AK, Post AK, grade 2 diast dysfxn, mild to mod MR, mild BAE, PASP 38  . Hypertension   . Arthritis   . Common bile duct dilatation   . Pancreatitis     post-ERCP 11/2009  . Colitis, ischemic   . Chronic systolic heart failure     Echo 09/05/11: mild focal basal septal hypertrophy, EF 40-45%, basal to mid IL AK, post AK, grade 2 diast dysfxn, mild to mod MR, mild BAE, PASP 38  . Atrial fibrillation     post  CABG in 2005 and in setting of acute illness in 1/11 (post ERCP pancreatitis) - coumadin not felt to be indicated  . Lumbar spondylosis   . Elevated LFTs     statin d/c'd in past due to   . BPH (benign prostatic hyperplasia)   . Sinus bradycardia     on beta blockers  . Ulcer 10/2012    in pylorus  . Allergy   . Anemia   . Myocardial infarction   . Diverticulitis     with partial colectomy   Past Surgical History  Procedure Laterality Date  . Colon resection    . Back surgery    . Ercp  11/13/2009    dilated bile ducts, atypical cytology, repeat attempt normal pancreatogram but unsuccessful biliary cannulation  . Coronary artery bypass graft      x 4  . Angioplasty  1999  . Rotator cuff repair    . Cataract extraction    . Esophagogastroduodenoscopy    . Flexible sigmoidoscopy    . Colonoscopy     Review of Systems As above, probably has some fatigue    Objective:   Physical Exam General:   NAD Eyes:   anicteric Abdomen:  soft and nontender, BS+ , no hepatosplenomegaly or mass there is a right direct inguinal hernia easily reducible Neuro: Alert and oriented    Data Reviewed:   As per history of present illness Lab Results  Component Value Date   ALT 41 03/12/2013   AST 77* 03/12/2013   ALKPHOS 148* 03/12/2013   BILITOT 1.1 03/12/2013      Assessment & Plan:   1. Abdominal pain, epigastric   2. Dilated bile duct   3. Abnormal alkaline phosphatase test   4. Abnormal transaminases   5. Cirrhosis of liver without mention of alcohol    He's had this chronic recurrent epigastric pain. 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 We have never really found out because of his dilated bile duct. Since she is having an increase in pain, he has cirrhosis, his LFTs are up some I'm going to pursue an MRI of the liver with MRCP. The last one was in 2011. He did have atypical cells on a bile duct brushing with suspected papillary stenosis, back in 2011. I have refilled as hyoscyamine. Further plans pending this test and I will call the results.  I appreciate the opportunity to care for this patient.    Medication List                   TAKE these medications       aspirin EC 81 MG tablet  Take 1 tablet (81 mg total) by mouth daily.     bisoprolol 5 MG tablet  Commonly known as:  ZEBETA  Take 0.5 tablets (2.5 mg total) by mouth daily.     finasteride 5 MG tablet  Commonly known as:  PROSCAR  Take 5 mg by mouth daily.     furosemide 20 MG tablet  Commonly known as:  LASIX  Take 1 tablet (20 mg total) by mouth daily.     HYDROcodone-acetaminophen 5-500 MG per tablet  Commonly known as:  VICODIN  Take 1 tablet by mouth as needed.     hyoscyamine 0.125 MG Tbdp  Commonly known as:  ANASPAZ  1-2 dissolved under tongue every 4 hours as needed for abdominal pain  losartan 50 MG tablet   Commonly known as:  COZAAR  Take 1 tablet (50 mg total) by mouth 2 (two) times daily.     pantoprazole 40 MG tablet  Commonly known as:  PROTONIX  1 by mouth each day 30 minutes before meal     pravastatin 40 MG tablet  Commonly known as:  PRAVACHOL  Take 1 tablet (40 mg total) by mouth every evening.          CC: Minda Meo, MD

## 2013-05-08 ENCOUNTER — Ambulatory Visit (HOSPITAL_COMMUNITY)
Admission: RE | Admit: 2013-05-08 | Discharge: 2013-05-08 | Disposition: A | Discharge status: Home / Self Care | Payer: Medicare Other | Source: Ambulatory Visit | Attending: Internal Medicine | Admitting: Internal Medicine

## 2013-05-08 ENCOUNTER — Other Ambulatory Visit: Payer: Self-pay | Admitting: Internal Medicine

## 2013-05-08 DIAGNOSIS — I8500 Esophageal varices without bleeding: Secondary | ICD-10-CM | POA: Insufficient documentation

## 2013-05-08 DIAGNOSIS — K8689 Other specified diseases of pancreas: Secondary | ICD-10-CM | POA: Insufficient documentation

## 2013-05-08 DIAGNOSIS — K7460 Unspecified cirrhosis of liver: Secondary | ICD-10-CM

## 2013-05-08 DIAGNOSIS — R748 Abnormal levels of other serum enzymes: Secondary | ICD-10-CM

## 2013-05-08 DIAGNOSIS — R1013 Epigastric pain: Secondary | ICD-10-CM

## 2013-05-08 DIAGNOSIS — K863 Pseudocyst of pancreas: Secondary | ICD-10-CM | POA: Insufficient documentation

## 2013-05-08 DIAGNOSIS — K862 Cyst of pancreas: Secondary | ICD-10-CM | POA: Insufficient documentation

## 2013-05-08 DIAGNOSIS — K828 Other specified diseases of gallbladder: Secondary | ICD-10-CM | POA: Insufficient documentation

## 2013-05-08 DIAGNOSIS — N281 Cyst of kidney, acquired: Secondary | ICD-10-CM | POA: Insufficient documentation

## 2013-05-08 DIAGNOSIS — K766 Portal hypertension: Secondary | ICD-10-CM | POA: Insufficient documentation

## 2013-05-08 DIAGNOSIS — K838 Other specified diseases of biliary tract: Secondary | ICD-10-CM

## 2013-05-08 MED ORDER — GADOBENATE DIMEGLUMINE 529 MG/ML IV SOLN
15.0000 mL | Freq: Once | INTRAVENOUS | Status: AC | PRN
  Administered 2013-05-08: 12 mL via INTRAVENOUS

## 2013-05-08 NOTE — Progress Notes (Signed)
Quick Note:  Please let him know nothing bad here - only things we knew he had How is he feeling? ______

## 2013-05-12 ENCOUNTER — Other Ambulatory Visit (INDEPENDENT_AMBULATORY_CARE_PROVIDER_SITE_OTHER): Payer: Medicare Other

## 2013-05-12 DIAGNOSIS — I5022 Chronic systolic (congestive) heart failure: Secondary | ICD-10-CM

## 2013-05-12 DIAGNOSIS — R0602 Shortness of breath: Secondary | ICD-10-CM

## 2013-05-12 DIAGNOSIS — I2510 Atherosclerotic heart disease of native coronary artery without angina pectoris: Secondary | ICD-10-CM

## 2013-05-12 LAB — BASIC METABOLIC PANEL
CO2: 30 mEq/L (ref 19–32)
Glucose, Bld: 98 mg/dL (ref 70–99)
Potassium: 4.1 mEq/L (ref 3.5–5.1)
Sodium: 140 mEq/L (ref 135–145)

## 2013-06-30 ENCOUNTER — Encounter: Payer: Self-pay | Admitting: Physician Assistant

## 2013-06-30 ENCOUNTER — Ambulatory Visit (INDEPENDENT_AMBULATORY_CARE_PROVIDER_SITE_OTHER): Payer: Medicare Other | Admitting: Physician Assistant

## 2013-06-30 VITALS — BP 139/70 | HR 67 | Ht 66.0 in | Wt 142.0 lb

## 2013-06-30 DIAGNOSIS — Z720 Tobacco use: Secondary | ICD-10-CM

## 2013-06-30 DIAGNOSIS — R609 Edema, unspecified: Secondary | ICD-10-CM

## 2013-06-30 DIAGNOSIS — I4891 Unspecified atrial fibrillation: Secondary | ICD-10-CM

## 2013-06-30 DIAGNOSIS — I2510 Atherosclerotic heart disease of native coronary artery without angina pectoris: Secondary | ICD-10-CM

## 2013-06-30 DIAGNOSIS — F17200 Nicotine dependence, unspecified, uncomplicated: Secondary | ICD-10-CM

## 2013-06-30 DIAGNOSIS — I5022 Chronic systolic (congestive) heart failure: Secondary | ICD-10-CM

## 2013-06-30 DIAGNOSIS — R0602 Shortness of breath: Secondary | ICD-10-CM

## 2013-06-30 DIAGNOSIS — E785 Hyperlipidemia, unspecified: Secondary | ICD-10-CM

## 2013-06-30 LAB — BASIC METABOLIC PANEL
BUN: 16 mg/dL (ref 6–23)
Chloride: 104 mEq/L (ref 96–112)
Glucose, Bld: 85 mg/dL (ref 70–99)
Potassium: 3.3 mEq/L — ABNORMAL LOW (ref 3.5–5.1)

## 2013-06-30 MED ORDER — POTASSIUM CHLORIDE CRYS ER 20 MEQ PO TBCR: 20.0000 meq | EXTENDED_RELEASE_TABLET | Freq: Every day | ORAL | Status: DC

## 2013-06-30 NOTE — Patient Instructions (Addendum)
Will obtain labs today and call you with the results (bmet/bnp)  INCREASE YOUR LASIX (FUROSEMIDE) TO 40 MG TWICE A DAY FOR 3 DAYS ONLY THEN RESUME 40 MG DAILY  ADD POTASSIUM 20 MEQ DAILY, RX SENT TO PHARMACY  CHECK YOUR MEDICATIONS AT HOME TO SEE IF YOU ARE TAKING BISOPROLOL AND CALL IN WITH INFORMATION [phone])  Keep legs elevated to heart level as often as possible  Try wearing knee high compression stockings daily  Return in 1 week for labs (bmet)  Your physician has requested that you have an echocardiogram. Echocardiography is a painless test that uses sound waves to create images of your heart. It provides your doctor with information about the size and shape of your heart and how well your heart's chambers and valves are working. This procedure takes approximately one hour. There are no restrictions for this procedure.  Your physician recommends that you schedule a follow-up appointment in: 2 weeks with Lorin Picket on a day that Dr Shirlee Latch is in the office  2 Gram Low Sodium Diet A 2 gram sodium diet restricts the amount of sodium in the diet to no more than 2 g or 2000 mg daily. Limiting the amount of sodium is often used to help lower blood pressure. It is important if you have heart, liver, or kidney problems. Many foods contain sodium for flavor and sometimes as a preservative. When the amount of sodium in a diet needs to be low, it is important to know what to look for when choosing foods and drinks. The following includes some information and guidelines to help make it easier for you to adapt to a low sodium diet. QUICK TIPS  Do not add salt to food.  Avoid convenience items and fast food.  Choose unsalted snack foods.  Buy lower sodium products, often labeled as "lower sodium" or "no salt added."  Check food labels to learn how much sodium is in 1 serving.  When eating at a restaurant, ask that your food be prepared with less salt or none, if possible. READING FOOD LABELS  FOR SODIUM INFORMATION The nutrition facts label is a good place to find how much sodium is in foods. Look for products with no more than 500 to 600 mg of sodium per meal and no more than 150 mg per serving. Remember that 2 g = 2000 mg. The food label may also list foods as:  Sodium-free: Less than 5 mg in a serving.  Very low sodium: 35 mg or less in a serving.  Low-sodium: 140 mg or less in a serving.  Light in sodium: 50% less sodium in a serving. For example, if a food that usually has 300 mg of sodium is changed to become light in sodium, it will have 150 mg of sodium.  Reduced sodium: 25% less sodium in a serving. For example, if a food that usually has 400 mg of sodium is changed to reduced sodium, it will have 300 mg of sodium. CHOOSING FOODS Grains  Avoid: Salted crackers and snack items. Some cereals, including instant hot cereals. Bread stuffing and biscuit mixes. Seasoned rice or pasta mixes.  Choose: Unsalted snack items. Low-sodium cereals, oats, puffed wheat and rice, shredded wheat. English muffins and bread. Pasta. Meats  Avoid: Salted, canned, smoked, spiced, pickled meats, including fish and poultry. Bacon, ham, sausage, cold cuts, hot dogs, anchovies.  Choose: Low-sodium canned tuna and salmon. Fresh or frozen meat, poultry, and fish. Dairy  Avoid: Processed cheese and spreads. Cottage cheese.  Buttermilk and condensed milk. Regular cheese.  Choose: Milk. Low-sodium cottage cheese. Yogurt. Sour cream. Low-sodium cheese. Fruits and Vegetables  Avoid: Regular canned vegetables. Regular canned tomato sauce and paste. Frozen vegetables in sauces. Olives. Rosita Fire. Relishes. Sauerkraut.  Choose: Low-sodium canned vegetables. Low-sodium tomato sauce and paste. Frozen or fresh vegetables. Fresh and frozen fruit. Condiments  Avoid: Canned and packaged gravies. Worcestershire sauce. Tartar sauce. Barbecue sauce. Soy sauce. Steak sauce. Ketchup. Onion, garlic, and table  salt. Meat flavorings and tenderizers.  Choose: Fresh and dried herbs and spices. Low-sodium varieties of mustard and ketchup. Lemon juice. Tabasco sauce. Horseradish. SAMPLE 2 GRAM SODIUM MEAL PLAN Breakfast / Sodium (mg)  1 cup low-fat milk / 143 mg  2 slices whole-wheat toast / 270 mg  1 tbs heart-healthy margarine / 153 mg  1 hard-boiled egg / 139 mg  1 small orange / 0 mg Lunch / Sodium (mg)  1 cup raw carrots / 76 mg   cup hummus / 298 mg  1 cup low-fat milk / 143 mg   cup red grapes / 2 mg  1 whole-wheat pita bread / 356 mg Dinner / Sodium (mg)  1 cup whole-wheat pasta / 2 mg  1 cup low-sodium tomato sauce / 73 mg  3 oz lean ground beef / 57 mg  1 small side salad (1 cup raw spinach leaves,  cup cucumber,  cup yellow bell pepper) with 1 tsp olive oil and 1 tsp red wine vinegar / 25 mg Snack / Sodium (mg)  1 container low-fat vanilla yogurt / 107 mg  3 graham cracker squares / 127 mg Nutrient Analysis  Calories: 2033  Protein: 77 g  Carbohydrate: 282 g  Fat: 72 g  Sodium: 1971 mg Document Released: 10/23/2005 Document Revised: 01/15/2012 Document Reviewed: 01/24/2010 [employer] Patient Information 2014 [place], [place].

## 2013-06-30 NOTE — Progress Notes (Signed)
[address] [place], [place]  [address] Phone: [phone] Fax:  [phone]  Date:  06/30/2013   ID:  Kirk, [name] 02/18/1927, MRN [id number]  PCP:  Minda Meo, MD  Cardiologist:  Dr. Marca Ancona     History of Present Illness: Kirk [name] is a 79 y.o. male who returns for follow up.    He has a hx of CAD s/p CABG, ischemic CMP, and chronic epigastric pain with idiopathic cirrhosis.  Seen by Dr. Marca Ancona 04/28/13.  He noted ankle edema and was placed on Lasix daily (had been taking sporadically) and was asked to follow up today.  Since last seen, he notes increasing LE edema over the last couple weeks. He increased his Lasix from 20-40 mg daily several days ago. He has noted some improvement. He denies dyspnea with exertion. He describes NYHA class II symptoms. He denies orthopnea or PND. He denies chest pain or syncope. He admits to a high salt diet. He eats at [employer] almost daily.  Labs (6/12):   LDL 92, HDl 60, AST 64, ALT 67  Labs (11/12): K 4.4, creatinine 0.8  Labs (3/13):   AST 63, ALT 54, BNP 277, K 4.1, creatinine 1.1  Labs (4/13):   LDL 80, HDL 43, AST 33, ALT 22, K 4.6, creatinine 1.1, proBNP 320  Labs (5/13):   K 4.2, creatinine 1.0  Labs (4/13):   LDL 80, HDL 43  Labs (8/13):   BNP 193  Labs (9/13):   K 4.6, creatinine 0.9, AST 115, ALT 58  Labs (12/13): HCT 30.6, K 3.9, creatinine 0.7, BNP 345  Labs (2/14):   LDL 99, HDL 46, LFTs normal  Labs (4/14):   LDL 72, HDL 47, K 4.6, creatinine 0.8  Labs (5/14):   AST 77, ALT 41  Labs (6/14):   K 3.8, Cr 0.8, ALT 25, Alb 3.2, Hgb 10.9, PLT 82K Labs (7/14):  K 4.1, Cr 0.9, BNP 342    Wt Readings from Last 3 Encounters:  06/30/13 142 lb (64.411 kg)  05/01/13 137 lb (62.143 kg)  04/28/13 142 lb (64.411 kg)     Past Medical History  Diagnosis Date  . GERD (gastroesophageal reflux disease)   . Diverticulosis     s/p partial colon resection  . CAD (coronary  artery disease)     s/p MI 1999;  s/p CABG 2005 (L-LAD, S-Dx, S-OM1, S-PDA); patient had post op AFib;  Myoview 8/10: mild fixed lateral defect; no ischemia, EF 47%  . Hyperlipemia   . Ischemic cardiomyopathy     echo 1/11: mild MR, mild LAE, EF 40-45%;  echo 10/12: EF 40-45%, IL AK, Post AK, grade 2 diast dysfxn, mild to mod MR, mild BAE, PASP 38  . Hypertension   . Arthritis   . Common bile duct dilatation   . Pancreatitis     post-ERCP 11/2009  . Colitis, ischemic   . Chronic systolic heart failure     Echo 09/05/11: mild focal basal septal hypertrophy, EF 40-45%, basal to mid IL AK, post AK, grade 2 diast dysfxn, mild to mod MR, mild BAE, PASP 38  . Atrial fibrillation     post CABG in 2005 and in setting of acute illness in 1/11 (post ERCP pancreatitis) - coumadin not felt to be indicated  . Lumbar spondylosis   . Elevated LFTs     statin d/c'd in past due to   . BPH (benign prostatic hyperplasia)   .  Sinus bradycardia     on beta blockers  . Ulcer 10/2012    in pylorus  . Allergy   . Anemia   . Myocardial infarction   . Diverticulitis     with partial colectomy  . Cirrhosis     Negative workup for viral hepatitis, autoimmune hepatitis, hemochromatosis. Never heavy ETOH. Suspect NASH. EGD (12/13) with esophageal varices and portal hypertensive gastropathy. EGD (3/14) with 3 small esophageal varices, portal hypertensive gastropathy.    Current Outpatient Prescriptions  Medication Sig Dispense Refill  . aspirin EC 81 MG tablet Take 1 tablet (81 mg total) by mouth daily.      . finasteride (PROSCAR) 5 MG tablet Take 5 mg by mouth daily.        . furosemide (LASIX) 20 MG tablet Take 1 tablet (20 mg total) by mouth daily.      Marland Kitchen HYDROcodone-acetaminophen (VICODIN) 5-500 MG per tablet Take 1 tablet by mouth as needed.        . hyoscyamine (ANASPAZ) 0.125 MG TBDP 1-2 dissolved under tongue every 4 hours as needed for abdominal pain  60 tablet  5  . losartan (COZAAR) 50 MG tablet  Take 1 tablet (50 mg total) by mouth 2 (two) times daily.  60 tablet  3  . pantoprazole (PROTONIX) 40 MG tablet 1 by mouth each day 30 minutes before meal  90 tablet  3  . pravastatin (PRAVACHOL) 40 MG tablet Take 1 tablet (40 mg total) by mouth every evening.  90 tablet  1   No current facility-administered medications for this visit.    Allergies:    Allergies  Allergen Reactions  . Erythromycin Itching and Rash    Patient allergic to all Mycins   . Penicillins Rash    Social History:  The patient  reports that he has been smoking Cigars.  He has never used smokeless tobacco. He reports that he does not drink alcohol or use illicit drugs.   ROS:  Please see the history of present illness.   He has a fairly nonproductive cough that is chronic. Denies hemoptysis.   All other systems reviewed and negative.   PHYSICAL EXAM: VS:  BP 139/70  Pulse 67  Ht 5\' 6"  (1.676 m)  Wt 142 lb (64.411 kg)  BMI 22.93 kg/m2 Well nourished, well developed, in no acute distress HEENT: normal Neck: minimally elevate JVD Cardiac:  normal S1, S2; RRR; no murmur; no gallop Lungs:  clear to auscultation bilaterally, no wheezing, rhonchi or rales Abd: soft, nontender, no hepatomegaly Ext: 2+ bilateral LE edema up to the knees Skin: warm and dry Neuro:  CNs 2-12 intact, no focal abnormalities noted  EKG:  NSR, HR 67, normal axis, inferolateral ST changes, no change from prior tracing     ASSESSMENT AND PLAN:  1. Chronic Systolic CHF:  His edema has worsened. I suspect his edema is likely multifactorial and related to venous insufficiency, hypoalbuminemia and cirrhosis. I will increase his Lasix to 40 mg twice a day. He will do this for 3 days, then resume 40 mg daily. Add potassium 20 mEq daily. Check a basic metabolic panel today and repeat in one week. Check a BNP today. Arrange followup echocardiogram to reassess LV function.  I've advised him to keep his legs elevated and wear compression stockings  daily. I will also give him a low sodium diet.  Bisoprolol is no longer on his list. I will ask him to check his medications at home. 2. CAD:  No angina. Continue aspirin and statin. 3. Atrial Fibrillation:  Maintaining sinus rhythm. If he has recurrence, he will need anticoagulation. 4. Hyperlipidemia:  Continue statin. 5. Tobacco Abuse:  He is trying to quit. 6. Disposition:  Follow up with me in 2 weeks.  Signed, Tereso Newcomer, PA-C  06/30/2013 9:05 AM

## 2013-07-01 ENCOUNTER — Telehealth: Payer: Self-pay | Admitting: *Deleted

## 2013-07-01 MED ORDER — BISOPROLOL FUMARATE 5 MG PO TABS: 2.5000 mg | ORAL_TABLET | Freq: Every day | ORAL | Status: DC

## 2013-07-01 MED ORDER — POTASSIUM CHLORIDE CRYS ER 20 MEQ PO TBCR: 40.0000 meq | EXTENDED_RELEASE_TABLET | Freq: Every day | ORAL | Status: DC

## 2013-07-01 NOTE — Telephone Encounter (Signed)
pt's wife notified about needing pt to start bisporolol 2.5 mg daily; wife verbalized Plan of Care to me today.

## 2013-07-01 NOTE — Telephone Encounter (Signed)
Message copied by Tarri Fuller on Tue Jul 01, 2013  5:45 PM ------      Message from: [place], [place] T      Created: Tue Jul 01, 2013  5:00 PM       Start Bisoprolol 2.5 mg QD      Tereso Newcomer, PA-C        07/01/2013 5:00 PM ------

## 2013-07-01 NOTE — Telephone Encounter (Signed)
Message copied by Tarri Fuller on Tue Jul 01, 2013  9:14 AM ------      Message from: [place], [place] T      Created: Mon Jun 30, 2013  1:43 PM       Take K+ 40 mEq QD       Make sure he gets BMET in 1 week.      Tereso Newcomer, PA-C        06/30/2013 1:42 PM ------

## 2013-07-01 NOTE — Telephone Encounter (Signed)
s/w pt's wife about lab results and to increase the K+ to 40 meq daily. Pt's wife said they had to rsc 9/12 appt with PA due to will be out of town. I rsc to 9/16 for pt with Bing Neighbors. PA. Wife said pt is not taking bisoprolol to let the PA know.

## 2013-07-08 ENCOUNTER — Other Ambulatory Visit (HOSPITAL_COMMUNITY): Payer: Medicare Other

## 2013-07-08 ENCOUNTER — Other Ambulatory Visit: Payer: Medicare Other

## 2013-07-14 ENCOUNTER — Ambulatory Visit (HOSPITAL_COMMUNITY): Payer: Medicare Other | Attending: Cardiology | Admitting: Radiology

## 2013-07-14 ENCOUNTER — Encounter: Payer: Self-pay | Admitting: Physician Assistant

## 2013-07-14 ENCOUNTER — Other Ambulatory Visit (INDEPENDENT_AMBULATORY_CARE_PROVIDER_SITE_OTHER): Payer: Medicare Other

## 2013-07-14 DIAGNOSIS — I252 Old myocardial infarction: Secondary | ICD-10-CM | POA: Insufficient documentation

## 2013-07-14 DIAGNOSIS — I5022 Chronic systolic (congestive) heart failure: Secondary | ICD-10-CM

## 2013-07-14 DIAGNOSIS — I509 Heart failure, unspecified: Secondary | ICD-10-CM

## 2013-07-14 DIAGNOSIS — E785 Hyperlipidemia, unspecified: Secondary | ICD-10-CM | POA: Insufficient documentation

## 2013-07-14 DIAGNOSIS — F17200 Nicotine dependence, unspecified, uncomplicated: Secondary | ICD-10-CM | POA: Insufficient documentation

## 2013-07-14 DIAGNOSIS — I059 Rheumatic mitral valve disease, unspecified: Secondary | ICD-10-CM | POA: Insufficient documentation

## 2013-07-14 DIAGNOSIS — R0989 Other specified symptoms and signs involving the circulatory and respiratory systems: Secondary | ICD-10-CM | POA: Insufficient documentation

## 2013-07-14 DIAGNOSIS — I2510 Atherosclerotic heart disease of native coronary artery without angina pectoris: Secondary | ICD-10-CM

## 2013-07-14 DIAGNOSIS — I079 Rheumatic tricuspid valve disease, unspecified: Secondary | ICD-10-CM | POA: Insufficient documentation

## 2013-07-14 DIAGNOSIS — I4891 Unspecified atrial fibrillation: Secondary | ICD-10-CM

## 2013-07-14 DIAGNOSIS — I2589 Other forms of chronic ischemic heart disease: Secondary | ICD-10-CM

## 2013-07-14 DIAGNOSIS — I10 Essential (primary) hypertension: Secondary | ICD-10-CM | POA: Insufficient documentation

## 2013-07-14 DIAGNOSIS — R0609 Other forms of dyspnea: Secondary | ICD-10-CM | POA: Insufficient documentation

## 2013-07-14 DIAGNOSIS — R609 Edema, unspecified: Secondary | ICD-10-CM | POA: Insufficient documentation

## 2013-07-14 DIAGNOSIS — I501 Left ventricular failure: Secondary | ICD-10-CM | POA: Insufficient documentation

## 2013-07-14 LAB — BASIC METABOLIC PANEL
BUN: 12 mg/dL (ref 6–23)
CO2: 28 mEq/L (ref 19–32)
Chloride: 106 mEq/L (ref 96–112)
Creatinine, Ser: 0.8 mg/dL (ref 0.4–1.5)
Glucose, Bld: 71 mg/dL (ref 70–99)

## 2013-07-14 NOTE — Progress Notes (Signed)
Echocardiogram performed.  

## 2013-07-15 ENCOUNTER — Other Ambulatory Visit: Payer: Self-pay | Admitting: *Deleted

## 2013-07-15 DIAGNOSIS — J90 Pleural effusion, not elsewhere classified: Secondary | ICD-10-CM

## 2013-07-16 ENCOUNTER — Ambulatory Visit (INDEPENDENT_AMBULATORY_CARE_PROVIDER_SITE_OTHER)
Admission: RE | Admit: 2013-07-16 | Discharge: 2013-07-16 | Disposition: A | Discharge status: Home / Self Care | Payer: Medicare Other | Source: Ambulatory Visit | Attending: Cardiology | Admitting: Cardiology

## 2013-07-16 ENCOUNTER — Other Ambulatory Visit: Payer: Self-pay

## 2013-07-16 DIAGNOSIS — I2510 Atherosclerotic heart disease of native coronary artery without angina pectoris: Secondary | ICD-10-CM

## 2013-07-16 DIAGNOSIS — J90 Pleural effusion, not elsewhere classified: Secondary | ICD-10-CM

## 2013-07-16 DIAGNOSIS — I5022 Chronic systolic (congestive) heart failure: Secondary | ICD-10-CM

## 2013-07-16 MED ORDER — LOSARTAN POTASSIUM 50 MG PO TABS: 50.0000 mg | ORAL_TABLET | Freq: Two times a day (BID) | ORAL | Status: DC

## 2013-07-18 ENCOUNTER — Ambulatory Visit: Payer: Medicare Other | Admitting: Physician Assistant

## 2013-07-22 ENCOUNTER — Ambulatory Visit (INDEPENDENT_AMBULATORY_CARE_PROVIDER_SITE_OTHER): Payer: Medicare Other | Admitting: Physician Assistant

## 2013-07-22 ENCOUNTER — Encounter: Payer: Self-pay | Admitting: Physician Assistant

## 2013-07-22 VITALS — BP 130/64 | HR 65 | Ht 67.0 in | Wt 143.0 lb

## 2013-07-22 DIAGNOSIS — I255 Ischemic cardiomyopathy: Secondary | ICD-10-CM

## 2013-07-22 DIAGNOSIS — Z720 Tobacco use: Secondary | ICD-10-CM

## 2013-07-22 DIAGNOSIS — F17200 Nicotine dependence, unspecified, uncomplicated: Secondary | ICD-10-CM

## 2013-07-22 DIAGNOSIS — E785 Hyperlipidemia, unspecified: Secondary | ICD-10-CM

## 2013-07-22 DIAGNOSIS — I5022 Chronic systolic (congestive) heart failure: Secondary | ICD-10-CM

## 2013-07-22 DIAGNOSIS — R609 Edema, unspecified: Secondary | ICD-10-CM

## 2013-07-22 DIAGNOSIS — I4891 Unspecified atrial fibrillation: Secondary | ICD-10-CM

## 2013-07-22 DIAGNOSIS — I2510 Atherosclerotic heart disease of native coronary artery without angina pectoris: Secondary | ICD-10-CM

## 2013-07-22 DIAGNOSIS — I2589 Other forms of chronic ischemic heart disease: Secondary | ICD-10-CM

## 2013-07-22 MED ORDER — BISOPROLOL FUMARATE 5 MG PO TABS: 2.5000 mg | ORAL_TABLET | Freq: Every day | ORAL | Status: DC

## 2013-07-22 MED ORDER — POTASSIUM CHLORIDE CRYS ER 20 MEQ PO TBCR: 20.0000 meq | EXTENDED_RELEASE_TABLET | Freq: Every day | ORAL | Status: DC

## 2013-07-22 MED ORDER — FUROSEMIDE 20 MG PO TABS: 40.0000 mg | ORAL_TABLET | Freq: Every day | ORAL | Status: DC

## 2013-07-22 MED ORDER — SPIRONOLACTONE 25 MG PO TABS: 12.5000 mg | ORAL_TABLET | Freq: Every day | ORAL | Status: DC

## 2013-07-22 NOTE — Patient Instructions (Addendum)
Kirk [name], PAC HAS ADVISED FOR YOU TO WEAR YOUR COMPRESSION STOCKINGS EVERY DAY FROM THE TIME YOU WAKE UP UNTIL BED TIME  START SPIRONOLACTONE 25 MG TABLET YOU WILL TAKE 1/2 TABLET (12.5 MG) DAILY LAB WORK 07/28/13 5 DAYS AFTER STARTING THE SPIRONOLACTONE  LAB WORK 08/04/13 12 DAYS AFTER STARTING SPIRONOLACTONE  START BISOPROLOL 5 MG TABLET YOU WILL TAKE 1/2 TABLET (2.5 MG ) DAILY  PLEASE FOLLOW UP WITH DR. Shirlee Latch IN 2 MONTHS

## 2013-07-22 NOTE — Progress Notes (Signed)
[address], [address] [place], [place]  [address] Phone: [phone] Fax:  [phone]  Date:  07/22/2013   ID:  Kirk [name], DOB 1927/04/25, MRN [id number]  PCP:  Minda Meo, MD  Cardiologist:  Dr. Marca Ancona     History of Present Illness: Kirk [name] is a 79 y.o. male who returns for follow up.    He has a hx of CAD s/p CABG, ischemic CMP, and chronic epigastric pain with idiopathic cirrhosis.  Seen by Dr. Marca Ancona 04/28/13.  He noted ankle edema and was placed on Lasix daily (had been taking sporadically) and was asked to follow up today.  I saw him in follow up 06/30/13. He noted increasing LE edema. He had already increased his Lasix from 20-40 mg daily several days prior. He had noted some improvement. Of note, he did admit to a high salt diet. He eats at [employer] almost daily.  I suspected that his edema was multifactorial and related to venous insufficiency, hypoalbuminemia and cirrhosis. I increased his Lasix for 3 more days.  Follow up echo 9/14: Mild focal basal septal hypertrophy, EF 40-45%, inferolateral, inferior and posterior HK, grade 1 diastolic dysfunction, MAC, mild MR, mild LAE, mild RVE, mild RAE, PASP 32 and a left pleural effusion.  Follow up chest x-ray demonstrated no effusion.  He was off of this bisoprolol. I asked him to restart this. I also counseled him on decreasing his sodium intake.  Overall doing well.  He feels like his edema is somewhat better.  The patient denies chest pain, significant shortness of breath, syncope, orthopnea, PND.  He is NYHA Class II.  Labs (6/12):   LDL 92, HDl 60, AST 64, ALT 67  Labs (11/12): K 4.4, creatinine 0.8  Labs (3/13):   AST 63, ALT 54, BNP 277, K 4.1, creatinine 1.1  Labs (4/13):   LDL 80, HDL 43, AST 33, ALT 22, K 4.6, creatinine 1.1, proBNP 320  Labs (5/13):   K 4.2, creatinine 1.0  Labs (4/13):   LDL 80, HDL 43  Labs (8/13):   BNP 193  Labs (9/13):   K 4.6, creatinine  0.9, AST 115, ALT 58  Labs (12/13): HCT 30.6, K 3.9, creatinine 0.7, BNP 345  Labs (2/14):   LDL 99, HDL 46, LFTs normal  Labs (4/14):   LDL 72, HDL 47, K 4.6, creatinine 0.8  Labs (5/14):   AST 77, ALT 41  Labs (6/14):   K 3.8, Cr 0.8, ALT 25, Alb 3.2, Hgb 10.9, PLT 82K Labs (7/14):  K 4.1, Cr 0.9, BNP 342  Labs (8/14):  K 3.3, creatinine 1.1, BNP 364 Labs (9/14):  K 3.8, creatinine 0.8  Wt Readings from Last 3 Encounters:  07/22/13 143 lb (64.864 kg)  06/30/13 142 lb (64.411 kg)  05/01/13 137 lb (62.143 kg)     Past Medical History  Diagnosis Date  . GERD (gastroesophageal reflux disease)   . Diverticulosis     s/p partial colon resection  . CAD (coronary artery disease)     s/p MI 1999;  s/p CABG 2005 (L-LAD, S-Dx, S-OM1, S-PDA); patient had post op AFib;  Myoview 8/10: mild fixed lateral defect; no ischemia, EF 47%  . Hyperlipemia   . Ischemic cardiomyopathy     echo 1/11: mild MR, mild LAE, EF 40-45%;  echo 10/12: EF 40-45%, IL AK, Post AK, grade 2 diast dysfxn, mild to mod MR, mild BAE, PASP 38;   Echo 9/14:  Mild focal basal septal hypertrophy, EF 40-45%, IL, inf and post HK, Gr 1 DD, MAC, mild MR, mild LAE, mild RVE, mild RAE, PASP 32, L pleural effusion  . Hypertension   . Arthritis   . Common bile duct dilatation   . Pancreatitis     post-ERCP 11/2009  . Colitis, ischemic   . Chronic systolic heart failure     Echo 09/05/11: mild focal basal septal hypertrophy, EF 40-45%, basal to mid IL AK, post AK, grade 2 diast dysfxn, mild to mod MR, mild BAE, PASP 38  . Atrial fibrillation     post CABG in 2005 and in setting of acute illness in 1/11 (post ERCP pancreatitis) - coumadin not felt to be indicated  . Lumbar spondylosis   . Elevated LFTs     statin d/c'd in past due to   . BPH (benign prostatic hyperplasia)   . Sinus bradycardia     on beta blockers  . Ulcer 10/2012    in pylorus  . Allergy   . Anemia   . Myocardial infarction   . Diverticulitis     with  partial colectomy  . Cirrhosis     Negative workup for viral hepatitis, autoimmune hepatitis, hemochromatosis. Never heavy ETOH. Suspect NASH. EGD (12/13) with esophageal varices and portal hypertensive gastropathy. EGD (3/14) with 3 small esophageal varices, portal hypertensive gastropathy.    Current Outpatient Prescriptions  Medication Sig Dispense Refill  . aspirin EC 81 MG tablet Take 1 tablet (81 mg total) by mouth daily.      Marland Kitchen escitalopram (LEXAPRO) 10 MG tablet       . finasteride (PROSCAR) 5 MG tablet Take 5 mg by mouth daily.        . furosemide (LASIX) 20 MG tablet Take 1 tablet (20 mg total) by mouth daily.      Marland Kitchen HYDROcodone-acetaminophen (VICODIN) 5-500 MG per tablet Take 1 tablet by mouth as needed.        Marland Kitchen HYDROcodone-homatropine (HYCODAN) 5-1.5 MG/5ML syrup       . hyoscyamine (ANASPAZ) 0.125 MG TBDP 1-2 dissolved under tongue every 4 hours as needed for abdominal pain  60 tablet  5  . losartan (COZAAR) 50 MG tablet Take 1 tablet (50 mg total) by mouth 2 (two) times daily.  60 tablet  6  . pantoprazole (PROTONIX) 40 MG tablet 1 by mouth each day 30 minutes before meal  90 tablet  3  . potassium chloride SA (K-DUR,KLOR-CON) 20 MEQ tablet Take 2 tablets (40 mEq total) by mouth daily.      . pravastatin (PRAVACHOL) 40 MG tablet Take 1 tablet (40 mg total) by mouth every evening.  90 tablet  1   No current facility-administered medications for this visit.    Allergies:    Allergies  Allergen Reactions  . Erythromycin Itching and Rash    Patient allergic to all Mycins   . Penicillins Rash    Social History:  The patient  reports that he has been smoking Cigars.  He has never used smokeless tobacco. He reports that he does not drink alcohol or use illicit drugs.   ROS:  Please see the history of present illness.     All other systems reviewed and negative.   PHYSICAL EXAM: VS:  BP 130/64  Pulse 65  Ht 5\' 7"  (1.702 m)  Wt 143 lb (64.864 kg)  BMI 22.39 kg/m2 Well  nourished, well developed, in no acute distress HEENT: normal Neck:  no  JVD Cardiac:  normal S1, S2; RRR; no murmur; no gallop Lungs:  clear to auscultation bilaterally, no wheezing, rhonchi or rales Abd: soft, nontender, no hepatomegaly Ext: 1-2+ bilateral LE edema Skin: warm and dry Neuro:  CNs 2-12 intact, no focal abnormalities noted  EKG:   NSR, HR 65, normal axis, inferolateral ST changes, no change from prior tracing    ASSESSMENT AND PLAN:  1. Chronic Systolic CHF:  Weights are stable.  He continues to have LE edema.  As noted, I believe this is multifactorial.  I have encouraged him to continue using his compression stockings and to keep legs elevated.  He will continue the same dose of lasix.  I will advance his HF Rx by adding Spironolactone 12.5 mg QD.  Decrease K+ to 20 QD. Check a BMET in 5 days and 12 days.  Continue Losartan and Bisoprolol. 2. Ischemic CM:  As noted, EF was unchanged by recent echo.  Advance medical Rx as noted.   3. CAD:  No angina. Continue aspirin and statin. 4. Atrial Fibrillation:  Maintaining sinus rhythm. If he has recurrence, he will need anticoagulation. 5. Hyperlipidemia:  Continue statin. 6. Tobacco Abuse:  He knows he needs to quit. 7. Disposition:  Follow up with Dr. Marca Ancona in 2 mos.  Signed, Tereso Newcomer, PA-C  07/22/2013 9:20 AM

## 2013-07-28 ENCOUNTER — Other Ambulatory Visit (INDEPENDENT_AMBULATORY_CARE_PROVIDER_SITE_OTHER): Payer: Medicare Other

## 2013-07-28 DIAGNOSIS — I4891 Unspecified atrial fibrillation: Secondary | ICD-10-CM

## 2013-07-28 DIAGNOSIS — I5022 Chronic systolic (congestive) heart failure: Secondary | ICD-10-CM

## 2013-07-28 LAB — BASIC METABOLIC PANEL
BUN: 21 mg/dL (ref 6–23)
Calcium: 8.1 mg/dL — ABNORMAL LOW (ref 8.4–10.5)
GFR: 60.12 mL/min (ref >60.00)
Glucose, Bld: 114 mg/dL — ABNORMAL HIGH (ref 70–99)

## 2013-07-30 ENCOUNTER — Telehealth: Payer: Self-pay | Admitting: Cardiology

## 2013-07-30 NOTE — Telephone Encounter (Signed)
Called patient's wife back. Her husband is complaining of feeling weak,tired, and cold all the time. Advised her to make an appointment with PCP to be seen for this. She agreed and verbalized understanding.

## 2013-07-30 NOTE — Telephone Encounter (Signed)
New Problem:  Pt's wife states the pt stays cold and weak. Please advise

## 2013-08-04 ENCOUNTER — Observation Stay (HOSPITAL_COMMUNITY)
Admission: EM | Admit: 2013-08-04 | Discharge: 2013-08-05 | Disposition: A | Discharge status: Home / Self Care | Payer: Medicare Other | Attending: Endocrinology | Admitting: Endocrinology

## 2013-08-04 ENCOUNTER — Encounter (HOSPITAL_COMMUNITY): Payer: Self-pay | Admitting: *Deleted

## 2013-08-04 ENCOUNTER — Other Ambulatory Visit: Payer: Self-pay | Admitting: *Deleted

## 2013-08-04 ENCOUNTER — Emergency Department (HOSPITAL_COMMUNITY): Payer: Medicare Other

## 2013-08-04 ENCOUNTER — Telehealth: Payer: Self-pay | Admitting: *Deleted

## 2013-08-04 ENCOUNTER — Other Ambulatory Visit (INDEPENDENT_AMBULATORY_CARE_PROVIDER_SITE_OTHER): Payer: Medicare Other

## 2013-08-04 DIAGNOSIS — E875 Hyperkalemia: Secondary | ICD-10-CM

## 2013-08-04 DIAGNOSIS — R001 Bradycardia, unspecified: Secondary | ICD-10-CM | POA: Diagnosis present

## 2013-08-04 DIAGNOSIS — I5023 Acute on chronic systolic (congestive) heart failure: Secondary | ICD-10-CM | POA: Diagnosis present

## 2013-08-04 DIAGNOSIS — K7460 Unspecified cirrhosis of liver: Secondary | ICD-10-CM | POA: Diagnosis present

## 2013-08-04 DIAGNOSIS — I255 Ischemic cardiomyopathy: Secondary | ICD-10-CM | POA: Diagnosis present

## 2013-08-04 DIAGNOSIS — I2510 Atherosclerotic heart disease of native coronary artery without angina pectoris: Secondary | ICD-10-CM | POA: Diagnosis present

## 2013-08-04 DIAGNOSIS — I4891 Unspecified atrial fibrillation: Secondary | ICD-10-CM | POA: Insufficient documentation

## 2013-08-04 DIAGNOSIS — I5022 Chronic systolic (congestive) heart failure: Secondary | ICD-10-CM

## 2013-08-04 DIAGNOSIS — Z79899 Other long term (current) drug therapy: Secondary | ICD-10-CM | POA: Insufficient documentation

## 2013-08-04 DIAGNOSIS — I498 Other specified cardiac arrhythmias: Secondary | ICD-10-CM | POA: Insufficient documentation

## 2013-08-04 DIAGNOSIS — R5381 Other malaise: Secondary | ICD-10-CM | POA: Insufficient documentation

## 2013-08-04 DIAGNOSIS — I8510 Secondary esophageal varices without bleeding: Secondary | ICD-10-CM | POA: Insufficient documentation

## 2013-08-04 DIAGNOSIS — I2589 Other forms of chronic ischemic heart disease: Secondary | ICD-10-CM | POA: Insufficient documentation

## 2013-08-04 DIAGNOSIS — I10 Essential (primary) hypertension: Secondary | ICD-10-CM | POA: Diagnosis present

## 2013-08-04 HISTORY — DX: Major depressive disorder, single episode, unspecified: F32.9

## 2013-08-04 HISTORY — DX: Unspecified cirrhosis of liver: K74.60

## 2013-08-04 HISTORY — DX: Hyperkalemia: E87.5

## 2013-08-04 HISTORY — DX: Depression, unspecified: F32.A

## 2013-08-04 HISTORY — DX: Secondary esophageal varices without bleeding: I85.10

## 2013-08-04 HISTORY — DX: Anxiety disorder, unspecified: F41.9

## 2013-08-04 LAB — POCT I-STAT, CHEM 8
BUN: 22 mg/dL (ref 6–23)
Calcium, Ion: 1.27 mmol/L (ref 1.13–1.30)
HCT: 34 % — ABNORMAL LOW (ref 39.0–52.0)
Hemoglobin: 11.6 g/dL — ABNORMAL LOW (ref 13.0–17.0)
TCO2: 22 mmol/L (ref 0–100)

## 2013-08-04 LAB — COMPREHENSIVE METABOLIC PANEL
BUN: 19 mg/dL (ref 6–23)
CO2: 20 mEq/L (ref 19–32)
Calcium: 8.8 mg/dL (ref 8.4–10.5)
Chloride: 103 mEq/L (ref 96–112)
Creatinine, Ser: 1.3 mg/dL (ref 0.50–1.35)
GFR calc Af Amer: 56 mL/min — ABNORMAL LOW (ref >90)
GFR calc non Af Amer: 49 mL/min — ABNORMAL LOW (ref >90)
Total Bilirubin: 0.6 mg/dL (ref 0.3–1.2)

## 2013-08-04 LAB — CBC WITH DIFFERENTIAL/PLATELET
Eosinophils Relative: 5 % (ref 0–5)
MCH: 32.7 pg (ref 26.0–34.0)
Monocytes Absolute: 0.8 10*3/uL (ref 0.1–1.0)
Monocytes Relative: 12 % (ref 3–12)
Neutrophils Relative %: 56 % (ref 43–77)
Platelets: 124 10*3/uL — ABNORMAL LOW (ref 150–400)
RBC: 3.27 MIL/uL — ABNORMAL LOW (ref 4.22–5.81)
WBC: 7.1 10*3/uL (ref 4.0–10.5)

## 2013-08-04 LAB — BASIC METABOLIC PANEL
CO2: 25 mEq/L (ref 19–32)
Calcium: 8.5 mg/dL (ref 8.4–10.5)
Creatinine, Ser: 1.2 mg/dL (ref 0.4–1.5)

## 2013-08-04 LAB — TROPONIN I: Troponin I: <0.3 ng/mL (ref <0.30)

## 2013-08-04 MED ORDER — ESCITALOPRAM OXALATE 10 MG PO TABS
10.0000 mg | ORAL_TABLET | Freq: Every day | ORAL | Status: DC
  Administered 2013-08-05: 11:00:00 10 mg via ORAL
  Filled 2013-08-04: qty 1

## 2013-08-04 MED ORDER — SODIUM CHLORIDE 0.9 % IV SOLN
Freq: Once | INTRAVENOUS | Status: AC
  Administered 2013-08-04: 19:00:00 via INTRAVENOUS

## 2013-08-04 MED ORDER — HYDROCODONE-HOMATROPINE 5-1.5 MG/5ML PO SYRP: 5.0000 mL | ORAL_SOLUTION | Freq: Four times a day (QID) | ORAL | Status: DC | PRN

## 2013-08-04 MED ORDER — FUROSEMIDE 40 MG PO TABS
40.0000 mg | ORAL_TABLET | Freq: Every day | ORAL | Status: DC
  Administered 2013-08-05: 40 mg via ORAL
  Filled 2013-08-04: qty 1

## 2013-08-04 MED ORDER — HYOSCYAMINE SULFATE 0.125 MG PO TBDP
0.1250 mg | ORAL_TABLET | Freq: Four times a day (QID) | ORAL | Status: DC | PRN
  Filled 2013-08-04: qty 1

## 2013-08-04 MED ORDER — SODIUM CHLORIDE 0.9 % IV SOLN: Freq: Once | INTRAVENOUS | Status: DC

## 2013-08-04 MED ORDER — HYDROCODONE-ACETAMINOPHEN 5-325 MG PO TABS
1.0000 | ORAL_TABLET | Freq: Four times a day (QID) | ORAL | Status: DC | PRN
  Administered 2013-08-05 (×2): 1 via ORAL
  Filled 2013-08-04 (×2): qty 1

## 2013-08-04 MED ORDER — SODIUM CHLORIDE 0.9 % IV SOLN
1.0000 g | INTRAVENOUS | Status: AC
  Administered 2013-08-04: 1 g via INTRAVENOUS
  Filled 2013-08-04 (×2): qty 10

## 2013-08-04 MED ORDER — CALCIUM GLUCONATE 10 % IV SOLN
1.0000 g | INTRAVENOUS | Status: AC
  Administered 2013-08-04: 1 g via INTRAVENOUS
  Filled 2013-08-04: qty 10

## 2013-08-04 MED ORDER — SIMVASTATIN 10 MG PO TABS
10.0000 mg | ORAL_TABLET | Freq: Every day | ORAL | Status: DC
  Administered 2013-08-05: 18:00:00 10 mg via ORAL
  Filled 2013-08-04: qty 1

## 2013-08-04 MED ORDER — INSULIN ASPART 100 UNIT/ML ~~LOC~~ SOLN
10.0000 [IU] | Freq: Once | SUBCUTANEOUS | Status: AC
  Administered 2013-08-04: 10 [IU] via INTRAVENOUS
  Filled 2013-08-04: qty 1

## 2013-08-04 MED ORDER — FINASTERIDE 5 MG PO TABS
5.0000 mg | ORAL_TABLET | Freq: Every day | ORAL | Status: DC
  Administered 2013-08-05: 5 mg via ORAL
  Filled 2013-08-04: qty 1

## 2013-08-04 MED ORDER — DEXTROSE 50 % IV SOLN
50.0000 mL | Freq: Once | INTRAVENOUS | Status: AC
  Administered 2013-08-04: 50 mL via INTRAVENOUS
  Filled 2013-08-04: qty 50

## 2013-08-04 MED ORDER — SODIUM POLYSTYRENE SULFONATE 15 GM/60ML PO SUSP
30.0000 g | Freq: Once | ORAL | Status: AC
  Administered 2013-08-04: 30 g via ORAL
  Filled 2013-08-04: qty 120

## 2013-08-04 MED ORDER — ASPIRIN EC 81 MG PO TBEC
81.0000 mg | DELAYED_RELEASE_TABLET | Freq: Every day | ORAL | Status: DC
  Administered 2013-08-05: 81 mg via ORAL
  Filled 2013-08-04: qty 1

## 2013-08-04 MED ORDER — INSULIN ASPART 100 UNIT/ML IV SOLN: 10.0000 [IU] | Freq: Once | INTRAVENOUS | Status: DC

## 2013-08-04 MED ORDER — PANTOPRAZOLE SODIUM 40 MG PO TBEC
40.0000 mg | DELAYED_RELEASE_TABLET | Freq: Every day | ORAL | Status: DC
  Administered 2013-08-05: 14:00:00 40 mg via ORAL
  Filled 2013-08-04: qty 1

## 2013-08-04 NOTE — ED Notes (Signed)
Patient transported to X-ray 

## 2013-08-04 NOTE — ED Notes (Signed)
Admitting MD at bedside.

## 2013-08-04 NOTE — ED Provider Notes (Signed)
CSN: [id number]     Arrival date & time 08/04/13  1648 History   First MD Initiated Contact with Patient 08/04/13 1719     Chief Complaint  Patient presents with  . Hyperkalemia    (Consider location/radiation/quality/duration/timing/severity/associated sxs/prior Treatment) HPI Patient with complicated medical history including coronary artery disease, ischemic cardiomyopathy, cirrhosis, atrial fibrillation presents for hyperkalemia. Patient had labs drawn today and was called to come into the emergency department for evaluation of an elevated potassium level. Patient complains of having generalized fatigue fatigue for several weeks but this has not changed recently. He denies fever, chills, shortness of breath, cough, chest pain, abdominal pain, nausea, and vomiting, diarrhea. He has ongoing urinary frequency due to being on Lasix but denies any dysuria. He takes supplemental potassium and had his dose doubled greater than a week ago. Past Medical History  Diagnosis Date  . GERD (gastroesophageal reflux disease)   . Diverticulosis     s/p partial colon resection  . CAD (coronary artery disease)     s/p MI 1999;  s/p CABG 2005 (L-LAD, S-Dx, S-OM1, S-PDA); patient had post op AFib;  Myoview 8/10: mild fixed lateral defect; no ischemia, EF 47%  . Hyperlipemia   . Ischemic cardiomyopathy     echo 1/11: mild MR, mild LAE, EF 40-45%;  echo 10/12: EF 40-45%, IL AK, Post AK, grade 2 diast dysfxn, mild to mod MR, mild BAE, PASP 38;   Echo 9/14:  Mild focal basal septal hypertrophy, EF 40-45%, IL, inf and post HK, Gr 1 DD, MAC, mild MR, mild LAE, mild RVE, mild RAE, PASP 32, L pleural effusion  . Hypertension   . Arthritis   . Common bile duct dilatation   . Pancreatitis     post-ERCP 11/2009  . Colitis, ischemic   . Chronic systolic heart failure     Echo 09/05/11: mild focal basal septal hypertrophy, EF 40-45%, basal to mid IL AK, post AK, grade 2 diast dysfxn, mild to mod MR, mild BAE, PASP 38   . Atrial fibrillation     post CABG in 2005 and in setting of acute illness in 1/11 (post ERCP pancreatitis) - coumadin not felt to be indicated  . Lumbar spondylosis   . Elevated LFTs     statin d/c'd in past due to   . BPH (benign prostatic hyperplasia)   . Sinus bradycardia     on beta blockers  . Ulcer 10/2012    in pylorus  . Allergy   . Anemia   . Myocardial infarction   . Diverticulitis     with partial colectomy  . Cirrhosis     Negative workup for viral hepatitis, autoimmune hepatitis, hemochromatosis. Never heavy ETOH. Suspect NASH. EGD (12/13) with esophageal varices and portal hypertensive gastropathy. EGD (3/14) with 3 small esophageal varices, portal hypertensive gastropathy.   Past Surgical History  Procedure Laterality Date  . Colon resection    . Back surgery    . Ercp  11/13/2009    dilated bile ducts, atypical cytology, repeat attempt normal pancreatogram but unsuccessful biliary cannulation  . Coronary artery bypass graft      x 4  . Angioplasty  1999  . Rotator cuff repair    . Cataract extraction    . Esophagogastroduodenoscopy    . Flexible sigmoidoscopy    . Colonoscopy     Family History  Problem Relation Age of Onset  . Diabetes Mother   . Heart disease Mother   . Congestive Heart  Failure Mother   . Lung cancer Father   . Colon cancer Neg Hx   . Esophageal cancer Neg Hx   . Rectal cancer Neg Hx   . Stomach cancer Neg Hx    History  Substance Use Topics  . Smoking status: Current Every Day Smoker -- 60 years    Types: Cigars  . Smokeless tobacco: Never Used     Comment: 2-cigars a day  . Alcohol Use: No    Review of Systems  Constitutional: Positive for fatigue. Negative for fever and chills.  HENT: Negative for congestion, rhinorrhea, neck pain and neck stiffness.   Eyes: Negative for visual disturbance.  Respiratory: Negative for cough, shortness of breath and wheezing.   Cardiovascular: Positive for leg swelling. Negative for chest  pain and palpitations.  Gastrointestinal: Negative for nausea, vomiting, abdominal pain and diarrhea.  Genitourinary: Positive for frequency. Negative for dysuria, hematuria and flank pain.  Musculoskeletal: Negative for myalgias and back pain.  Skin: Negative for rash and wound.  Neurological: Positive for weakness (generalized). Negative for dizziness, light-headedness, numbness and headaches.  All other systems reviewed and are negative.    Date: 08/04/2013  Rate: 47  Rhythm: sinus bradycardia  QRS Axis: normal  Intervals: normal  ST/T Wave abnormalities: nonspecific T wave changes  Conduction Disutrbances:none  Narrative Interpretation:   Old EKG Reviewed: unchanged Vision is in sinus bradycardia otherwise he has no hyperacute were peak T waves. No prolonged PR or QRS interval compared to his previous EKG  Allergies  Erythromycin and Penicillins  Home Medications   Current Outpatient Rx  Name  Route  Sig  Dispense  Refill  . aspirin EC 81 MG tablet   Oral   Take 1 tablet (81 mg total) by mouth daily.         . bisoprolol (ZEBETA) 5 MG tablet   Oral   Take 0.5 tablets (2.5 mg total) by mouth daily.   30 tablet   11   . escitalopram (LEXAPRO) 10 MG tablet               . finasteride (PROSCAR) 5 MG tablet   Oral   Take 5 mg by mouth daily.           . furosemide (LASIX) 20 MG tablet   Oral   Take 2 tablets (40 mg total) by mouth daily.         Marland Kitchen HYDROcodone-acetaminophen (VICODIN) 5-500 MG per tablet   Oral   Take 1 tablet by mouth as needed.           Marland Kitchen HYDROcodone-homatropine (HYCODAN) 5-1.5 MG/5ML syrup               . hyoscyamine (ANASPAZ) 0.125 MG TBDP      1-2 dissolved under tongue every 4 hours as needed for abdominal pain   60 tablet   5   . losartan (COZAAR) 50 MG tablet   Oral   Take 1 tablet (50 mg total) by mouth 2 (two) times daily.   60 tablet   6   . pantoprazole (PROTONIX) 40 MG tablet      1 by mouth each day 30  minutes before meal   90 tablet   3   . potassium chloride SA (K-DUR,KLOR-CON) 20 MEQ tablet   Oral   Take 1 tablet (20 mEq total) by mouth daily.         . pravastatin (PRAVACHOL) 40 MG tablet   Oral  Take 1 tablet (40 mg total) by mouth every evening.   90 tablet   1   . spironolactone (ALDACTONE) 25 MG tablet   Oral   Take 0.5 tablets (12.5 mg total) by mouth daily.   30 tablet   11    BP 167/54  Temp(Src) 98.7 F (37.1 C) (Oral)  SpO2 100% Physical Exam  Nursing note and vitals reviewed. Constitutional: He is oriented to person, place, and time. He appears well-developed and well-nourished. No distress.  HENT:  Head: Normocephalic and atraumatic.  Mouth/Throat: Oropharynx is clear and moist.  Eyes: EOM are normal. Pupils are equal, round, and reactive to light.  Neck: Normal range of motion. Neck supple.  Cardiovascular: Normal rate and regular rhythm.   Pulmonary/Chest: Effort normal and breath sounds normal. No respiratory distress. He has no wheezes. He has no rales. He exhibits no tenderness.  Abdominal: Soft. Bowel sounds are normal. He exhibits no distension and no mass. There is no tenderness. There is no rebound and no guarding.  Musculoskeletal: Normal range of motion. He exhibits edema (2+ bilateral pitting edema). He exhibits no tenderness.  Neurological: He is alert and oriented to person, place, and time.  Patient is alert and oriented x3 with clear, goal oriented speech. Patient has 5/5 motor in all extremities. Sensation is intact to light touch.   Skin: Skin is warm and dry. No rash noted. No erythema.  Psychiatric:  Flat affect    ED Course  Procedures (including critical care time) Labs Review Labs Reviewed  CBC WITH DIFFERENTIAL  COMPREHENSIVE METABOLIC PANEL  URINALYSIS, ROUTINE W REFLEX MICROSCOPIC  TROPONIN I   Imaging Review No results found.  MDM  Suspect possible lab error will repeat potassium and screen for other causes for  his fatigue.  Repeat testing shows persistently elevated potassium at 7.0. Patient given judicious IV fluids, insulin, glucose and IV calcium. Remained stable in the emergency department. Discuss with Dr. Evlyn Kanner of [facility]. Will see the patient in the emergency department and admit.  Loren Racer, MD 08/04/13 [phone]

## 2013-08-04 NOTE — H&P (Signed)
PCP:   Minda Meo, MD   Chief Complaint:  Sent ER by cardiology for high potassium  HPI: This is an 62 goal white male with long-standing cardiac disease including atrial fibrillation, ischemic cardiomyopathy and chronic systolic heart failure. He's been on arriving medications recently had an adjustment upward in his diuretic dose. He was told to double his potassium at that time. Potassium most recently a week or so ago was 4.8 it is now 6.8 at the outside facility. Repeat here was 6.9 and 7.0. He's received glucose, insulin, and calcium but has not really had a reduction yet and h is level. His EKG does not reveal peak T waves but he has bradycardia. Other medications that may be playing a role include his spironolactone, losartan, potassium supplements, and beta blocker. He was seen recently in our office for generalized weakness. He was started on iron medications due to anemia. He's only taken a couple days. He's lost some weight recently. He is generally weak. He gets short winded at times. He notes no chest pain. His bowels are working fairly well for today. He is unsteady when up. He has chronic abdominal complaints are relatively stable.  Review of Systems:  Review of Systems - Negative except As above Past Medical History: Past Medical History  Diagnosis Date  . GERD (gastroesophageal reflux disease)   . Diverticulosis     s/p partial colon resection  . CAD (coronary artery disease)     s/p MI 1999;  s/p CABG 2005 (L-LAD, S-Dx, S-OM1, S-PDA); patient had post op AFib;  Myoview 8/10: mild fixed lateral defect; no ischemia, EF 47%  . Hyperlipemia   . Ischemic cardiomyopathy     echo 1/11: mild MR, mild LAE, EF 40-45%;  echo 10/12: EF 40-45%, IL AK, Post AK, grade 2 diast dysfxn, mild to mod MR, mild BAE, PASP 38;   Echo 9/14:  Mild focal basal septal hypertrophy, EF 40-45%, IL, inf and post HK, Gr 1 DD, MAC, mild MR, mild LAE, mild RVE, mild RAE, PASP 32, L pleural effusion  .  Hypertension   . Arthritis   . Common bile duct dilatation   . Pancreatitis     post-ERCP 11/2009  . Colitis, ischemic   . Chronic systolic heart failure     Echo 09/05/11: mild focal basal septal hypertrophy, EF 40-45%, basal to mid IL AK, post AK, grade 2 diast dysfxn, mild to mod MR, mild BAE, PASP 38  . Atrial fibrillation     post CABG in 2005 and in setting of acute illness in 1/11 (post ERCP pancreatitis) - coumadin not felt to be indicated  . Lumbar spondylosis   . Elevated LFTs     statin d/c'd in past due to   . BPH (benign prostatic hyperplasia)   . Sinus bradycardia     on beta blockers  . Ulcer 10/2012    in pylorus  . Allergy   . Anemia   . Myocardial infarction   . Diverticulitis     with partial colectomy  . Cirrhosis     Negative workup for viral hepatitis, autoimmune hepatitis, hemochromatosis. Never heavy ETOH. Suspect NASH. EGD (12/13) with esophageal varices and portal hypertensive gastropathy. EGD (3/14) with 3 small esophageal varices, portal hypertensive gastropathy.   Past Surgical History  Procedure Laterality Date  . Colon resection    . Back surgery    . Ercp  11/13/2009    dilated bile ducts, atypical cytology, repeat attempt normal pancreatogram but  unsuccessful biliary cannulation  . Coronary artery bypass graft      x 4  . Angioplasty  1999  . Rotator cuff repair    . Cataract extraction    . Esophagogastroduodenoscopy    . Flexible sigmoidoscopy    . Colonoscopy      Medications: Prior to Admission medications   Medication Sig Start Date End Date Taking? Authorizing Provider  aspirin EC 81 MG tablet Take 1 tablet (81 mg total) by mouth daily. 10/25/12   Laurey Morale, MD  bisoprolol (ZEBETA) 5 MG tablet Take 0.5 tablets (2.5 mg total) by mouth daily. 07/22/13   Beatrice Lecher, PA-C  escitalopram (LEXAPRO) 10 MG tablet  07/17/13   Historical Provider, MD  finasteride (PROSCAR) 5 MG tablet Take 5 mg by mouth daily.      Historical Provider,  MD  furosemide (LASIX) 20 MG tablet Take 2 tablets (40 mg total) by mouth daily. 07/22/13   Beatrice Lecher, PA-C  HYDROcodone-acetaminophen (VICODIN) 5-500 MG per tablet Take 1 tablet by mouth as needed.      Historical Provider, MD  HYDROcodone-homatropine [place]) 5-1.5 MG/5ML syrup  07/14/13   Historical Provider, MD  hyoscyamine (ANASPAZ) 0.125 MG TBDP 1-2 dissolved under tongue every 4 hours as needed for abdominal pain 05/01/13   Iva Boop, MD  losartan (COZAAR) 50 MG tablet Take 1 tablet (50 mg total) by mouth 2 (two) times daily. 07/16/13   Laurey Morale, MD  pantoprazole (PROTONIX) 40 MG tablet 1 by mouth each day 30 minutes before meal 12/06/12   Iva Boop, MD  potassium chloride SA (K-DUR,KLOR-CON) 20 MEQ tablet Take 1 tablet (20 mEq total) by mouth daily. 07/22/13   Beatrice Lecher, PA-C  pravastatin (PRAVACHOL) 40 MG tablet Take 1 tablet (40 mg total) by mouth every evening. 12/26/12   Laurey Morale, MD  spironolactone (ALDACTONE) 25 MG tablet Take 0.5 tablets (12.5 mg total) by mouth daily. 07/22/13   Beatrice Lecher, PA-C    Allergies:   Allergies  Allergen Reactions  . Erythromycin Itching and Rash    Patient allergic to all Mycins   . Penicillins Rash    Social History:  reports that he has been smoking Cigars.  He has never used smokeless tobacco. He reports that he does not drink alcohol or use illicit drugs.  Family History: Family History  Problem Relation Age of Onset  . Diabetes Mother   . Heart disease Mother   . Congestive Heart Failure Mother   . Lung cancer Father   . Colon cancer Neg Hx   . Esophageal cancer Neg Hx   . Rectal cancer Neg Hx   . Stomach cancer Neg Hx     Physical Exam: Filed Vitals:   08/04/13 1800 08/04/13 1845 08/04/13 1900 08/04/13 1915  BP: 165/49 153/44 147/56 128/48  Pulse: 38 39 46 39  Temp:      TempSrc:      Resp: 23 20 18 20   SpO2: 98% 100% 97% 97%   General appearance: Thin pale white male sitting up in no  distress. Face is symmetric. Oral mucous membranes   are moist. Head: Normocephalic, without obvious abnormality, atraumatic Eyes: Sclerae are anicteric and there is no nystagmus  Neck: no adenopathy, no carotid bruit, no JVD and thyroid not enlarged, symmetric, no tenderness/mass/nodules Resp: Clear but distant with no excess her muscles in use. No wheezing is heard. Cardio: Slow and regular and distant GI: Scaphoid  soft, non-tender; bowel sounds normal; no masses,  no liver edge felt Extremities: extremities normal, atraumatic, no cyanosis or edema Pulses: Slightly reduced with no edema. The extremities are noted. Lymph nodes: Cervical adenopathy: no cervical lymphadenopathy Neurologic: Alert and oriented X 3, globally weak. No tremor is present. No asterixis is present.  Labs on Admission:   Recent Labs  08/04/13 0852 08/04/13 1741 08/04/13 1816  NA 132* 130* 134*  K 6.8* 6.9* 7.0*  CL 109 103 109  CO2 25 20  --   GLUCOSE 102* 77 76  BUN 18 19 22   CREATININE 1.2 1.30 1.70*  CALCIUM 8.5 8.8  --     Recent Labs  08/04/13 1741  AST 33  ALT 17  ALKPHOS 148*  BILITOT 0.6  PROT 7.2  ALBUMIN 2.9*   No results found for this basename: LIPASE, AMYLASE,  in the last 72 hours  Recent Labs  08/04/13 1741 08/04/13 1816  WBC 7.1  --   NEUTROABS 4.0  --   HGB 10.7* 11.6*  HCT 32.1* 34.0*  MCV 98.2  --   PLT 124*  --     Recent Labs  08/04/13 1741  TROPONINI <0.30   Lab Results  Component Value Date   INR 1.2* 08/07/2012      Radiological Exams on Admission: Dg Chest 2 View  08/04/2013   CLINICAL DATA:  Weakness.  EXAM: CHEST  2 VIEW  COMPARISON:  07/16/2013  FINDINGS: Two views of chest demonstrate median sternotomy wires and evidence of prior CABG procedure. There is blunting at the costophrenic angles suggesting small bilateral pleural effusions. No evidence for pulmonary edema. Heart size is stable. Trachea is midline.  IMPRESSION: Small bilateral pleural  effusions.   Electronically Signed   By: Richarda Overlie M.D.   On: 08/04/2013 18:40   Dg Chest 2 View  07/16/2013   CLINICAL DATA:  Left pleural effusion  EXAM: CHEST  2 VIEW  COMPARISON:  10/21/2011  FINDINGS: Cardiomediastinal silhouette is stable. Status post CABG. Hyperinflation again noted. No acute infiltrate or pulmonary edema. Degenerative changes thoracic spine.  IMPRESSION: No active cardiopulmonary disease. Status post CABG. Hyperinflation again noted. Degenerative changes thoracic spine.   Electronically Signed   By: Natasha Mead   On: 07/16/2013 10:30   Orders placed during the hospital encounter of 08/04/13  . ED EKGSinus bradycardia Nonspecific intraventricular conduction delay Abnormal T, consider ischemia, lateral leads  . ED EKG  . EKG 12-LEAD  . EKG 12-LEAD  In size and in And is a: Study Conclusions 09/08 14 - Left ventricle: The cavity size was normal. There was mild focal basal hypertrophy of the septum. Systolic function was mildly to moderately reduced. The estimated ejection fraction was in the range of 40% to 45%. There is severe hypokinesis of the entire inferolateral, inferior and basal posterior myocardium. Doppler parameters are consistent with abnormal left ventricular relaxation (grade 1 diastolic dysfunction). - Mitral valve: Mildly calcified annulus. Mild regurgitation. - Left atrium: The atrium was mildly dilated. - Right ventricle: The cavity size was mildly dilated. - Right atrium: The atrium was mildly dilated. - Pulmonary arteries: PA peak pressure: 32mm Hg (S). - Pericardium, extracardiac: There was a left pleural effusion.  ---------------------------------------------------------: Appears stable--- Labs, Appears stable aprior tests, procedures, and surgery: Echocardiography (October 2012). The mitral valve showed mild to moderate regurgitation. EF was 45% and PA pressure was 38 (systolic). Mild bilateral atrial enlargement.  Coronary artery  bypass grafting. Transthoracic echocardiography. M-mode, complete 2D,  spectral Doppler, and color Doppler. Height: Height: 167.6cm. Height: 66in. Weight: Weight: 64.4kg. Weight: 141.7lb. Body mass index: BMI: 22.9kg/m^2. Body surface area: BSA: 1.52m^2. Blood pressure: 139/78. Patient status: Outpatient. Location: [place] [facility]  ------------------------------------------------------------  ------------------------------------------------------------ Left ventricle: The cavity size was normal. There was mild focal basal hypertrophy of the septum. Systolic function was mildly to moderately reduced. The estimated ejection fraction was in the range of 40% to 45%. Regional wall motion abnormalities: There is severe hypokinesis of the entire inferolateral, inferior and basal posterior myocardium. Doppler parameters are consistent with abnormal left ventricular relaxation (grade 1 diastolic dysfunction).  ------------------------------------------------------------ Aortic valve: Mildly thickened leaflets. Cusp separation was normal. Doppler: Transvalvular velocity was within the normal range. There was no stenosis. No regurgitation.  ------------------------------------------------------------ Aorta: Aortic root: The aortic root was normal in size.    Assessment/Plan Principal Problem:   Hyperkalemia: this is fairly significant and repeatedly abnormal. I suspect this is a combination of the medications as noted above as well as perhaps poor perfusion. At the present we will hold his ARB, his Aldactone, his potassium, and his beta blocker. Will add some Kayexylate and another dose of Calcium Active Problems:   CAD (coronary artery disease)   Acute on chronic systolic heart failure: appears stable   Ischemic cardiomyopathy: Echo done recently    HTN (hypertension): Blood pressure is up a little. He may need other adjuvant therapy    Esophageal varices in cirrhosis, mild pportal  gastropathy: No evidence of active bleeding. He is on a beta blocker chronically.    Cirrhosis of liver without mention of alcohol: No evidence of significant encephalopathy and today's exam    Bradycardia: See above  Anemia: check in AM CRI: up some, gently hydrate FULL CODE STATUS   Fouad Taul ALAN 08/04/2013, 7:40 PM

## 2013-08-04 NOTE — ED Notes (Addendum)
Reports sent here for high potassium drawn this morning. Denies CP, SOB.  Does c/o worsening generalized weakness x 3 weeks & intermittent dizziness with position changes. States PCP & cardiologist aware. ED MD informed & aware of HR 38/min

## 2013-08-04 NOTE — Telephone Encounter (Addendum)
[facility] lab called me with a reported K level of 7 done at 322PM. Reviewed with Tereso Newcomer PA and he advised the patient go to the ER for treatment. Spoke with  Kirk [name] and she will bring him over tho the ER from the lab. Cardmaster notified.

## 2013-08-04 NOTE — ED Notes (Signed)
Pt was told to come to the ED for elevated potassium and last potassium was 6.8

## 2013-08-04 NOTE — Progress Notes (Signed)
Order written for bmet/ hyperkalemia, see order at top of labs.

## 2013-08-05 ENCOUNTER — Encounter (HOSPITAL_COMMUNITY): Payer: Self-pay | Admitting: General Practice

## 2013-08-05 LAB — COMPREHENSIVE METABOLIC PANEL
ALT: 14 U/L (ref 0–53)
AST: 27 U/L (ref 0–37)
Albumin: 2.6 g/dL — ABNORMAL LOW (ref 3.5–5.2)
CO2: 19 mEq/L (ref 19–32)
Calcium: 8.7 mg/dL (ref 8.4–10.5)
Creatinine, Ser: 1.17 mg/dL (ref 0.50–1.35)
GFR calc Af Amer: 64 mL/min — ABNORMAL LOW (ref >90)
GFR calc non Af Amer: 55 mL/min — ABNORMAL LOW (ref >90)
Glucose, Bld: 88 mg/dL (ref 70–99)
Total Protein: 6.3 g/dL (ref 6.0–8.3)

## 2013-08-05 LAB — CBC
HCT: 27.7 % — ABNORMAL LOW (ref 39.0–52.0)
Hemoglobin: 9.4 g/dL — ABNORMAL LOW (ref 13.0–17.0)
MCHC: 33.9 g/dL (ref 30.0–36.0)
MCV: 95.8 fL (ref 78.0–100.0)
RBC: 2.89 MIL/uL — ABNORMAL LOW (ref 4.22–5.81)
WBC: 5.4 10*3/uL (ref 4.0–10.5)

## 2013-08-05 LAB — URINALYSIS, ROUTINE W REFLEX MICROSCOPIC
Leukocytes, UA: NEGATIVE
Nitrite: NEGATIVE
Protein, ur: 100 mg/dL — AB
Specific Gravity, Urine: 1.015 (ref 1.005–1.030)
Urobilinogen, UA: 0.2 mg/dL (ref 0.0–1.0)

## 2013-08-05 LAB — BASIC METABOLIC PANEL
BUN: 20 mg/dL (ref 6–23)
Calcium: 9.1 mg/dL (ref 8.4–10.5)
Chloride: 103 mEq/L (ref 96–112)
Creatinine, Ser: 1.27 mg/dL (ref 0.50–1.35)
GFR calc Af Amer: 58 mL/min — ABNORMAL LOW (ref >90)

## 2013-08-05 LAB — URINE MICROSCOPIC-ADD ON

## 2013-08-05 LAB — POTASSIUM: Potassium: 5.4 mEq/L — ABNORMAL HIGH (ref 3.5–5.1)

## 2013-08-05 MED ORDER — SODIUM POLYSTYRENE SULFONATE 15 GM/60ML PO SUSP
30.0000 g | Freq: Once | ORAL | Status: AC
  Administered 2013-08-05: 11:00:00 30 g via ORAL
  Filled 2013-08-05: qty 120

## 2013-08-05 MED ORDER — SODIUM CHLORIDE 0.9 % IV SOLN
INTRAVENOUS | Status: DC
  Administered 2013-08-05: 10 mL/h via INTRAVENOUS

## 2013-08-05 MED ORDER — LOSARTAN POTASSIUM 50 MG PO TABS: 50.0000 mg | ORAL_TABLET | Freq: Every day | ORAL | Status: DC

## 2013-08-05 NOTE — Progress Notes (Signed)
CRITICAL VALUE ALERT  Critical value received:  k 6.3  Date of notification:  9-30  Time of notification:  830  Critical value read back:yes  Nurse who received alert:  Ninetta Lights  MD notified (1st page):  [phone],   MD aware Pt K is up level was 6.7 before this lab. Trending down

## 2013-08-05 NOTE — Progress Notes (Signed)
Subjective: Sitting up eating, feels better. potssium still high-see orders  Objective: Vital signs in last 24 hours: Temp:  [97.6 F (36.4 C)-98.7 F (37.1 C)] 97.6 F (36.4 C) (09/30 0603) Pulse Rate:  [38-60] 57 (09/30 1300) Resp:  [18-23] 18 (09/30 1100) BP: (128-167)/(44-74) 149/58 mmHg (09/30 1300) SpO2:  [94 %-100 %] 97 % (09/30 1300) Weight:  [64.501 kg (142 lb 3.2 oz)-65.318 kg (144 lb)] 64.501 kg (142 lb 3.2 oz) (09/30 0603) Weight change:   CBG (last 3)  No results found for this basename: GLUCAP,  in the last 72 hours  Intake/Output from previous day: 09/29 0701 - 09/30 0700 In: 1111.3 [P.O.:60; I.V.:941.3; IV Piggyback:110] Out: 555 [Urine:555]  Physical Exam: Alert, awake. No jvd. Chest clear. RRR-distant 50s, trace edema. Neuro normal. Intact pulses   Lab Results:  Recent Labs  08/04/13 2235 08/05/13 0640  NA 129* 132*  K 6.7* 6.3*  CL 103 106  CO2 19 19  GLUCOSE 83 88  BUN 20 19  CREATININE 1.27 1.17  CALCIUM 9.1 8.7    Recent Labs  08/04/13 1741 08/05/13 0640  AST 33 27  ALT 17 14  ALKPHOS 148* 119*  BILITOT 0.6 0.6  PROT 7.2 6.3  ALBUMIN 2.9* 2.6*    Recent Labs  08/04/13 1741 08/04/13 1816 08/05/13 0640  WBC 7.1  --  5.4  NEUTROABS 4.0  --   --   HGB 10.7* 11.6* 9.4*  HCT 32.1* 34.0* 27.7*  MCV 98.2  --  95.8  PLT 124*  --  106*   Lab Results  Component Value Date   INR 1.2* 08/07/2012    Recent Labs  08/04/13 1741  TROPONINI <0.30   No results found for this basename: TSH, T4TOTAL, FREET3, T3FREE, THYROIDAB,  in the last 72 hours No results found for this basename: VITAMINB12, FOLATE, FERRITIN, TIBC, IRON, RETICCTPCT,  in the last 72 hours  Studies/Results: Dg Chest 2 View  08/04/2013   CLINICAL DATA:  Weakness.  EXAM: CHEST  2 VIEW  COMPARISON:  07/16/2013  FINDINGS: Two views of chest demonstrate median sternotomy wires and evidence of prior CABG procedure. There is blunting at the costophrenic angles suggesting  small bilateral pleural effusions. No evidence for pulmonary edema. Heart size is stable. Trachea is midline.  IMPRESSION: Small bilateral pleural effusions.   Electronically Signed   By: Richarda Overlie M.D.   On: 08/04/2013 18:40     Assessmen1t/Plan: 1. Hyperkalemia- meds adjusted, keaxylate, rhthym ok 2. CAD/CHF- compensated-back meds down 3. Cirrhosis- stable 4. PAF/bradycardia- readjust meds  Home pending potassium   LOS: 1 day   Eddie Payette A 08/05/2013, 3:02 PM

## 2013-08-05 NOTE — Discharge Summary (Signed)
DISCHARGE SUMMARY  Kirk [name]  MR#: [id number]  DOB:06-20-1927  Date of Admission: 08/04/2013 Date of Discharge: 08/05/2013  Attending Physician:Kirk [name]  Patient's WUJ:Kirk [name],Kirk [name], Kirk [name]  Consults:  none  Discharge Diagnoses: Principal Problem:   Hyperkalemia Active Problems:   CAD (coronary artery disease)   Acute on chronic systolic heart failure   Ischemic cardiomyopathy   HTN (hypertension)   Esophageal varices in cirrhosis, mild pportal gastropathy   Cirrhosis of liver without mention of alcohol   Bradycardia   Discharge Medications:   Medication List    STOP taking these medications       bisoprolol 5 MG tablet  Commonly known as:  ZEBETA     potassium chloride SA 20 MEQ tablet  Commonly known as:  K-DUR,KLOR-CON     spironolactone 25 MG tablet  Commonly known as:  ALDACTONE      TAKE these medications       aspirin EC 81 MG tablet  Take 1 tablet (81 mg total) by mouth daily.     escitalopram 10 MG tablet  Commonly known as:  LEXAPRO  Take 10 mg by mouth daily.     finasteride 5 MG tablet  Commonly known as:  PROSCAR  Take 5 mg by mouth daily.     furosemide 20 MG tablet  Commonly known as:  LASIX  Take 2 tablets (40 mg total) by mouth daily.     HYDROcodone-acetaminophen 5-325 MG per tablet  Commonly known as:  NORCO/VICODIN  Take 1 tablet by mouth every 4 (four) hours as needed for pain.     hyoscyamine 0.125 MG Tbdp tablet  Commonly known as:  ANASPAZ  1-2 dissolved under tongue every 4 hours as needed for abdominal pain     losartan 50 MG tablet  Commonly known as:  COZAAR  Take 1 tablet (50 mg total) by mouth daily.     pantoprazole 40 MG tablet  Commonly known as:  PROTONIX  1 by mouth each day 30 minutes before meal     pravastatin 40 MG tablet  Commonly known as:  PRAVACHOL  Take 1 tablet (40 mg total) by mouth every evening.        Hospital Procedures: Dg Chest 2 View  08/04/2013   CLINICAL DATA:   Weakness.  EXAM: CHEST  2 VIEW  COMPARISON:  07/16/2013  FINDINGS: Two views of chest demonstrate median sternotomy wires and evidence of prior CABG procedure. There is blunting at the costophrenic angles suggesting small bilateral pleural effusions. No evidence for pulmonary edema. Heart size is stable. Trachea is midline.  IMPRESSION: Small bilateral pleural effusions.   Electronically Signed   By: Kirk Overlie M.D.   On: 08/04/2013 18:40   Dg Chest 2 View  07/16/2013   CLINICAL DATA:  Left pleural effusion  EXAM: CHEST  2 VIEW  COMPARISON:  10/21/2011  FINDINGS: Cardiomediastinal silhouette is stable. Status post CABG. Hyperinflation again noted. No acute infiltrate or pulmonary edema. Degenerative changes thoracic spine.  IMPRESSION: No active cardiopulmonary disease. Status post CABG. Hyperinflation again noted. Degenerative changes thoracic spine.   Electronically Signed   By: Kirk [name]   On: 07/16/2013 10:30    History of Present Illness: This is an 65 goal white male with long-standing cardiac disease including atrial fibrillation, ischemic cardiomyopathy and chronic systolic heart failure. He's been on arriving medications recently had an adjustment upward in his diuretic dose. He was told to double his potassium at that time. Potassium most recently  [name] week or so ago was 4.8 it is now 6.8 at the outside facility. Repeat here was 6.9 and 7.0. He's received glucose, insulin, and calcium but has not really had [name] reduction yet and h is level. His EKG does not reveal peak T waves but he has bradycardia. Other medications that may be playing [name] role include his spironolactone, losartan, potassium supplements, and beta blocker. He was seen recently in our office for generalized weakness. He was started on iron medications due to anemia. He's only taken [name] couple days. He's lost some weight recently. He is generally weak. He gets short winded at times. He notes no chest pain. His bowels are working fairly well  for today. He is unsteady when up. He has chronic abdominal complaints are relatively stable.   Hospital Course: Admitted, monitored and hydrated. Several meds discontinued--bblocker, potassium and spirinolactone.  Sodium and renal normalized.  Potassium corrected with kaeaxylate. Heart rate slowly improved. No signs of chf. D/C for further outpatient.  Day of Discharge Exam BP 149/58  Pulse 57  Temp(Src) 97.6 F (36.4 C) (Oral)  Resp 18  Ht 5\' 7"  (1.702 m)  Wt 64.501 kg (142 lb 3.2 oz)  BMI 22.27 kg/m2  SpO2 97%  Physical Exam: General appearance: alert, cooperative and no distress Eyes: no scleral icterus Throat: oropharynx moist without erythema Resp: clear to auscultation bilaterally Cardio: regular rate and rhythm Extremities: no clubbing, cyanosis. Trace edema baseline Neuro normal  Discharge Labs:  Recent Labs  08/04/13 2235 08/05/13 0640 08/05/13 1440  NA 129* 132*  --   K 6.7* 6.3* 5.4*  CL 103 106  --   CO2 19 19  --   GLUCOSE 83 88  --   BUN 20 19  --   CREATININE 1.27 1.17  --   CALCIUM 9.1 8.7  --     Recent Labs  08/04/13 1741 08/05/13 0640  AST 33 27  ALT 17 14  ALKPHOS 148* 119*  BILITOT 0.6 0.6  PROT 7.2 6.3  ALBUMIN 2.9* 2.6*    Recent Labs  08/04/13 1741 08/04/13 1816 08/05/13 0640  WBC 7.1  --  5.4  NEUTROABS 4.0  --   --   HGB 10.7* 11.6* 9.4*  HCT 32.1* 34.0* 27.7*  MCV 98.2  --  95.8  PLT 124*  --  106*    Recent Labs  08/04/13 1741  TROPONINI <0.30   No results found for this basename: TSH, T4TOTAL, FREET3, T3FREE, THYROIDAB,  in the last 72 hours No results found for this basename: VITAMINB12, FOLATE, FERRITIN, TIBC, IRON, RETICCTPCT,  in the last 72 hours  Discharge instructions:     Discharge Orders   Future Appointments Provider Department Dept Phone   09/23/2013 9:15 AM Kirk Morale, Kirk [name] [facility] Office [phone]   Future Orders Complete By Expires   Diet - low sodium heart healthy  As  directed    Increase activity slowly  As directed       Disposition: home  Follow-up Appts: Follow-up with Kirk [name] at [facility] in this week.  Call for appointment.  Condition on Discharge: stable  Tests Needing Follow-up: Labs this week  Signed: Rennae Ferraiolo [name] 08/05/2013, 6:35 PM

## 2013-09-08 ENCOUNTER — Other Ambulatory Visit: Payer: Self-pay | Admitting: Internal Medicine

## 2013-09-08 ENCOUNTER — Ambulatory Visit
Admission: RE | Admit: 2013-09-08 | Discharge: 2013-09-08 | Disposition: A | Discharge status: Home / Self Care | Payer: Medicare Other | Source: Ambulatory Visit | Attending: Internal Medicine | Admitting: Internal Medicine

## 2013-09-08 ENCOUNTER — Telehealth: Payer: Self-pay

## 2013-09-08 DIAGNOSIS — R141 Gas pain: Secondary | ICD-10-CM

## 2013-09-08 DIAGNOSIS — K7460 Unspecified cirrhosis of liver: Secondary | ICD-10-CM

## 2013-09-08 NOTE — Telephone Encounter (Signed)
Left detailed message that [employer] on back order currently, we will contact him when we get it in to set up appointment for this injection.

## 2013-09-10 ENCOUNTER — Encounter: Payer: Self-pay | Admitting: Internal Medicine

## 2013-09-10 ENCOUNTER — Ambulatory Visit (INDEPENDENT_AMBULATORY_CARE_PROVIDER_SITE_OTHER): Payer: Medicare Other | Admitting: Internal Medicine

## 2013-09-10 VITALS — BP 120/50 | HR 72 | Ht 66.0 in | Wt 156.0 lb

## 2013-09-10 DIAGNOSIS — R2681 Unsteadiness on feet: Secondary | ICD-10-CM

## 2013-09-10 DIAGNOSIS — Z9181 History of falling: Secondary | ICD-10-CM

## 2013-09-10 DIAGNOSIS — R609 Edema, unspecified: Secondary | ICD-10-CM | POA: Insufficient documentation

## 2013-09-10 DIAGNOSIS — R188 Other ascites: Secondary | ICD-10-CM

## 2013-09-10 DIAGNOSIS — D649 Anemia, unspecified: Secondary | ICD-10-CM | POA: Insufficient documentation

## 2013-09-10 DIAGNOSIS — R269 Unspecified abnormalities of gait and mobility: Secondary | ICD-10-CM

## 2013-09-10 DIAGNOSIS — I2510 Atherosclerotic heart disease of native coronary artery without angina pectoris: Secondary | ICD-10-CM

## 2013-09-10 DIAGNOSIS — K7460 Unspecified cirrhosis of liver: Secondary | ICD-10-CM

## 2013-09-10 DIAGNOSIS — I5022 Chronic systolic (congestive) heart failure: Secondary | ICD-10-CM

## 2013-09-10 MED ORDER — SPIRONOLACTONE 25 MG PO TABS: 50.0000 mg | ORAL_TABLET | Freq: Every day | ORAL | Status: DC

## 2013-09-10 MED ORDER — LOSARTAN POTASSIUM 50 MG PO TABS: 25.0000 mg | ORAL_TABLET | Freq: Every day | ORAL | Status: DC

## 2013-09-10 NOTE — Assessment & Plan Note (Signed)
This seems to be multifactorial due to weakness, anemia. I am prescribing a walker to reduce the chance of fall.

## 2013-09-10 NOTE — Assessment & Plan Note (Addendum)
Increase spironolactone to 50 mg daily Perform a diagnostic and therapeutic paracentesis with albumin consolidate furosemide dose of the morning Reduce losartan to 25 mg daily to reduce the chance of hyperkalemia BMET early next week Return to clinic in 2 weeks with advanced practitioner and me in one month

## 2013-09-10 NOTE — Progress Notes (Signed)
Subjective:    Patient ID: Kirk [name], male    DOB: 1927/09/28, 79 y.o.   MRN: [id number]  HPI The patient is here with his wife for followup. Since I last saw him several months ago he is been hospitalized with hyperkalemia. He may have been taking his medications wrong. At any rate he had things adjusted in the hyperkalemia was treated successfully. He started to have edema and ascites, he saw cardiology that his diuretics temporarily increased. He recently had an ultrasound that showed marked ascites. He is having lower extremity edema and weight gain. He is weaker and more unsteady on his feet. His wife reports that he's been taking oxycodone for leg pain from the edema, he apparently took too many the other night or more than he was supposed to and was very confused the next day. That washed out of his system, he did tolerate taking one last night without any excessive sleepiness. He has not really been confused otherwise. He denies abdominal pain or fever. Medications, allergies, past medical history, past surgical history, family history and social history are reviewed and updated in the EMR.   Review of Systems He is weak, he has been confused somewhat. This seems to be related to narcotics as mentioned above. Having lower extremity pain. He has not fallen, his wife has a cane, the patient has been reluctant to use the cane. All other review of systems negative or except as per history of present illness.    Objective:   Physical Exam General:  Elderly white man appearing frail and chronically ill Eyes:  anicteric. Neck:   supple   Lungs: Clear to auscultation bilaterally. Heart:  S1S2, no rubs, murmurs, gallops. Abdomen:  Significant distention with bulging of the flanks, consistent with ascites there are prominent veins in the abdominal wall. He is nontender and there is no organomegaly or mass or hernia. Lymph:  no cervical or supraclavicular adenopathy. Extremities:   Pitting  edema to the mid pretibial areas bilaterally, 2+, right slightly greater than the left Skin   no rash. Neuro:  A&O x 3. There is no asterixis Psych:  appropriate mood and  Affect.   Data Reviewed: Abdominal ultrasound 09/08/2013 Evidence of cirrhosis with moderate ascites.  Mild mobile gallbladder sludge. No definite gallstones and no  additional evidence to suggest cholecystitis.  Chronic stable dilatation of the common bile duct measuring 1 cm.  Labs in the EMR, hospitalization records, cardiology notes in the last 6 weeks. Discharge summary. He was admitted with hyperkalemia. [facility] notes also review from October.  October 2 BUN 11 creatinine 0.9 potassium 5 sodium 134. Hemoglobin 10.2 with an MCV of 99. Platelets 144.  November 3 BUN 35 creatinine 1.7 hemoglobin 9.8 with MCV 100.1 platelets 190.     Assessment & Plan:   1. Cirrhosis of liver without mention of alcohol   2. Ascites   3. Edema   4. Chronic systolic heart failure   5. Coronary atherosclerosis of native coronary artery   6. Unstable gait   7. At risk for falling    Current outpatient prescriptions:aspirin EC 81 MG tablet, Take 1 tablet (81 mg total) by mouth daily., Disp: , Rfl: ;  Diclofenac Sodium CR 100 MG 24 hr tablet, Take 100 mg by mouth as needed for pain., Disp: , Rfl: ;  escitalopram (LEXAPRO) 10 MG tablet, Take 10 mg by mouth daily., Disp: , Rfl: ;  finasteride (PROSCAR) 5 MG tablet, Take 5 mg by mouth daily.  ,  Disp: , Rfl:  furosemide (LASIX) 20 MG tablet, Take 2 tablets (40 mg total) by mouth daily., Disp: , Rfl: ;  iron polysaccharides (NU-IRON) 150 MG capsule, Take 150 mg by mouth daily., Disp: , Rfl: ;  losartan (COZAAR) 50 MG tablet, Take 0.5 tablets (25 mg total) by mouth daily., Disp: 60 tablet, Rfl: 6;  oxyCODONE-acetaminophen (PERCOCET/ROXICET) 5-325 MG per tablet, Take 1 tablet by mouth every 4 (four) hours as needed for severe pain., Disp: , Rfl:  pantoprazole (PROTONIX) 40 MG tablet, 1 by  mouth each day 30 minutes before meal, Disp: 90 tablet, Rfl: 3;  pravastatin (PRAVACHOL) 40 MG tablet, Take 1 tablet (40 mg total) by mouth every evening., Disp: 90 tablet, Rfl: 1;  spironolactone (ALDACTONE) 25 MG tablet, Take 2 tablets (50 mg total) by mouth daily., Disp: 60 tablet, Rfl: 2 HYDROcodone-acetaminophen (NORCO/VICODIN) 5-325 MG per tablet, Take 1 tablet by mouth every 4 (four) hours as needed for pain., Disp: , Rfl: ;  hyoscyamine (ANASPAZ) 0.125 MG TBDP, 1-2 dissolved under tongue every 4 hours as needed for abdominal pain, Disp: 60 tablet, Rfl: 5 I appreciate the opportunity to care for this patient.  CC: Minda Meo, MD

## 2013-09-10 NOTE — Assessment & Plan Note (Addendum)
Increase spironolactone 50 mg daily Please see the ascites assessment and plan

## 2013-09-10 NOTE — Patient Instructions (Addendum)
You have been given a separate informational sheet regarding your tobacco use, the importance of quitting and local resources to help you quit.  Today we are setting you up for a follow up appointment with [name] PA-C on 09/24/13 at 8:30am.  And then come back and see Dr. Leone Payor 10/08/13 at 10:15am.  Decrease your Losartin to 25 mg daily.  Your physician has requested that you go to the basement for the following lab work next Wednesday, no appointment needed and they are open 7:30am -5:30pm   Use a walker or cane to assist with walking.  We are giving you a printed rx for spironolactone 25 mg tablets, take as directed.  Tomorrow go to [facility] at 10:15am for a 10:45 am appointment to have the fluid drawn off.  I appreciate the opportunity to care for you.

## 2013-09-10 NOTE — Assessment & Plan Note (Signed)
Mildly macrocytic, this is probably all related to his cirrhosis. He can hold his iron for the time being I will recheck a CBC next week.

## 2013-09-10 NOTE — Assessment & Plan Note (Addendum)
decompensated with ascites now. That seems to be the only problem at this point. Manage his ascites. He does not seem to be encephalopathic we'll check an ammonia when I do labs next week. He could need a repeat EGD sooner with his decompensation check size or varices.

## 2013-09-11 ENCOUNTER — Encounter (HOSPITAL_COMMUNITY): Payer: Self-pay

## 2013-09-11 ENCOUNTER — Ambulatory Visit (HOSPITAL_COMMUNITY)
Admission: RE | Admit: 2013-09-11 | Discharge: 2013-09-11 | Disposition: A | Discharge status: Home / Self Care | Payer: Medicare Other | Source: Ambulatory Visit | Attending: Internal Medicine | Admitting: Internal Medicine

## 2013-09-11 ENCOUNTER — Telehealth: Payer: Self-pay | Admitting: Internal Medicine

## 2013-09-11 DIAGNOSIS — R142 Eructation: Secondary | ICD-10-CM | POA: Insufficient documentation

## 2013-09-11 DIAGNOSIS — R188 Other ascites: Secondary | ICD-10-CM | POA: Insufficient documentation

## 2013-09-11 DIAGNOSIS — K7460 Unspecified cirrhosis of liver: Secondary | ICD-10-CM

## 2013-09-11 DIAGNOSIS — R609 Edema, unspecified: Secondary | ICD-10-CM

## 2013-09-11 DIAGNOSIS — R141 Gas pain: Secondary | ICD-10-CM | POA: Insufficient documentation

## 2013-09-11 LAB — BODY FLUID CELL COUNT WITH DIFFERENTIAL
Eos, Fluid: 0 %
Lymphs, Fluid: 20 %
Monocyte-Macrophage-Serous Fluid: 57 % (ref 50–90)
Neutrophil Count, Fluid: 23 % (ref 0–25)

## 2013-09-11 LAB — PROTEIN, BODY FLUID

## 2013-09-11 MED ORDER — SODIUM CHLORIDE 0.9 % IV SOLN
Freq: Once | INTRAVENOUS | Status: AC
  Administered 2013-09-11: 12:00:00 via INTRAVENOUS

## 2013-09-11 MED ORDER — ALBUMIN HUMAN 25 % IV SOLN
50.0000 g | Freq: Once | INTRAVENOUS | Status: AC
  Administered 2013-09-11: 50 g via INTRAVENOUS
  Filled 2013-09-11: qty 200

## 2013-09-11 NOTE — Telephone Encounter (Signed)
He could skip the furoesmide or take 1/2 today (40) Let's have him only take 40 daily going forward until I say otherwise

## 2013-09-11 NOTE — Procedures (Addendum)
Successful US guided paracentesis from RLQ.  Yielded 5.5L of clear yellow fluid.  No immediate complications.  Pt tolerated well.   Specimen was sent for labs. The patient was also sent to Short Stay dept for 50g IV Albumin as ordered  Brayton El PA-C 09/11/2013 11:06 AM

## 2013-09-11 NOTE — Telephone Encounter (Signed)
Advised that patient is to take 80 mg a day.  He has 40 mg pills.  Patient wanted to just take half dose today and resume 80 mg in the am.  He is "wiped out"  from paracentesis and albumin treatment.  He is worried about being up all night.  They removed 5 liters of ascites today.

## 2013-09-11 NOTE — Telephone Encounter (Signed)
Wife advised medlist updated

## 2013-09-14 LAB — BODY FLUID CULTURE

## 2013-09-15 ENCOUNTER — Other Ambulatory Visit: Payer: Self-pay

## 2013-09-15 DIAGNOSIS — K7460 Unspecified cirrhosis of liver: Secondary | ICD-10-CM

## 2013-09-15 NOTE — Progress Notes (Signed)
Quick Note:  No signs of infection or cancer in fluid He should be getting a BMET today/tomorrow Please ask him to come up and get weighed also  Thanks ______

## 2013-09-16 ENCOUNTER — Other Ambulatory Visit: Payer: Self-pay

## 2013-09-16 ENCOUNTER — Ambulatory Visit: Payer: Medicare Other | Admitting: Internal Medicine

## 2013-09-16 ENCOUNTER — Other Ambulatory Visit (INDEPENDENT_AMBULATORY_CARE_PROVIDER_SITE_OTHER): Payer: Medicare Other

## 2013-09-16 ENCOUNTER — Telehealth: Payer: Self-pay

## 2013-09-16 VITALS — Wt 147.6 lb

## 2013-09-16 DIAGNOSIS — R188 Other ascites: Secondary | ICD-10-CM

## 2013-09-16 DIAGNOSIS — K7460 Unspecified cirrhosis of liver: Secondary | ICD-10-CM

## 2013-09-16 LAB — CBC WITH DIFFERENTIAL/PLATELET
Basophils Relative: 0.6 % (ref 0.0–3.0)
Eosinophils Relative: 3.9 % (ref 0.0–5.0)
Hemoglobin: 8.1 g/dL — ABNORMAL LOW (ref 13.0–17.0)
Lymphocytes Relative: 16.7 % (ref 12.0–46.0)
MCV: 97.2 fl (ref 78.0–100.0)
Monocytes Absolute: 0.5 10*3/uL (ref 0.1–1.0)
Monocytes Relative: 10.7 % (ref 3.0–12.0)
Neutrophils Relative %: 68.1 % (ref 43.0–77.0)
Platelets: 112 10*3/uL — ABNORMAL LOW (ref 150.0–400.0)
RBC: 2.49 Mil/uL — ABNORMAL LOW (ref 4.22–5.81)
WBC: 5.1 10*3/uL (ref 4.5–10.5)

## 2013-09-16 LAB — AMMONIA: Ammonia: 40 umol/L — ABNORMAL HIGH (ref 11–35)

## 2013-09-16 LAB — BASIC METABOLIC PANEL
CO2: 27 mEq/L (ref 19–32)
Chloride: 108 mEq/L (ref 96–112)
Creatinine, Ser: 1 mg/dL (ref 0.4–1.5)
Glucose, Bld: 138 mg/dL — ABNORMAL HIGH (ref 70–99)
Potassium: 3.9 mEq/L (ref 3.5–5.1)
Sodium: 138 mEq/L (ref 135–145)

## 2013-09-16 LAB — WEIGHT CHECK WT00: Weight: 147.6

## 2013-09-16 NOTE — Telephone Encounter (Signed)
Patient reports that on his home scale he is 140 lbs. Nude.  In the office today he is 147.6 with shoes and clothes.  Updated on vitals sheet

## 2013-09-18 ENCOUNTER — Telehealth: Payer: Self-pay | Admitting: Internal Medicine

## 2013-09-18 MED ORDER — RIFAXIMIN 550 MG PO TABS: 550.0000 mg | ORAL_TABLET | Freq: Two times a day (BID) | ORAL | Status: DC

## 2013-09-18 MED ORDER — FUROSEMIDE 20 MG PO TABS: 40.0000 mg | ORAL_TABLET | Freq: Every day | ORAL | Status: DC

## 2013-09-18 NOTE — Telephone Encounter (Signed)
Notes Recorded by Iva Boop, MD on 09/18/2013 at 10:15 AM BMET ok Ammonia a little high and he seemed a bit confused last week  1) Xifaxin 550 mg bid #60 11 refills - dx. Hepatic encephalopathy 2) He sees Amy 11/19 3) Please get him an appt me 2-3 weeks after Amy appt or with Amy again if I am full         I spoke in detail with the patient's wife.  She verbalized understanding of all instructions.  Patient has follow up on 09/24/13 with Mike Gip PA and Dr. Leone Payor 10/08/13 10:15.  He will get his final Twinrix vaccine on 09/24/13 in the office.  Patient also sent in a refill for lasix 40 mg daily

## 2013-09-18 NOTE — Progress Notes (Signed)
Quick Note:  BMET ok Ammonia a little high and he seemed a bit confused last week  1) Xifaxin 550 mg bid #60 11 refills - dx. Hepatic encephalopathy 2) He sees Amy 11/19 3) Please get him an appt me 2-3 weeks after Amy appt or with Amy again if I am full ______

## 2013-09-22 ENCOUNTER — Telehealth: Payer: Self-pay | Admitting: Internal Medicine

## 2013-09-22 NOTE — Telephone Encounter (Signed)
Wife called and was confused because the Xifaxan they were given stated that it was for diarrhea. Discussed with her that the xifaxan was given for hepatic encephalopathy. She states pts belly is still swollen, wt is at 140. Pt has appt on Wed. Instructed them to call back sooner if they have any other problems. Wife verbalized understanding.

## 2013-09-23 ENCOUNTER — Ambulatory Visit: Payer: Medicare Other | Admitting: Cardiology

## 2013-09-24 ENCOUNTER — Encounter: Payer: Self-pay | Admitting: Physician Assistant

## 2013-09-24 ENCOUNTER — Ambulatory Visit (INDEPENDENT_AMBULATORY_CARE_PROVIDER_SITE_OTHER): Payer: Medicare Other | Admitting: Physician Assistant

## 2013-09-24 ENCOUNTER — Other Ambulatory Visit (INDEPENDENT_AMBULATORY_CARE_PROVIDER_SITE_OTHER): Payer: Medicare Other

## 2013-09-24 VITALS — BP 116/58 | HR 72 | Ht 66.0 in | Wt 150.2 lb

## 2013-09-24 DIAGNOSIS — R188 Other ascites: Secondary | ICD-10-CM

## 2013-09-24 DIAGNOSIS — K7460 Unspecified cirrhosis of liver: Secondary | ICD-10-CM

## 2013-09-24 LAB — CBC WITH DIFFERENTIAL/PLATELET
Basophils Absolute: 0 10*3/uL (ref 0.0–0.1)
Basophils Relative: 0.3 % (ref 0.0–3.0)
Eosinophils Absolute: 0.3 10*3/uL (ref 0.0–0.7)
HCT: 25.3 % — ABNORMAL LOW (ref 39.0–52.0)
Lymphocytes Relative: 19.6 % (ref 12.0–46.0)
Lymphs Abs: 1.1 10*3/uL (ref 0.7–4.0)
MCHC: 33.1 g/dL (ref 30.0–36.0)
MCV: 97.5 fl (ref 78.0–100.0)
Monocytes Absolute: 0.7 10*3/uL (ref 0.1–1.0)
Neutrophils Relative %: 62.1 % (ref 43.0–77.0)
Platelets: 136 10*3/uL — ABNORMAL LOW (ref 150.0–400.0)
RBC: 2.59 Mil/uL — ABNORMAL LOW (ref 4.22–5.81)

## 2013-09-24 LAB — BASIC METABOLIC PANEL
BUN: 19 mg/dL (ref 6–23)
CO2: 28 mEq/L (ref 19–32)
Calcium: 8.5 mg/dL (ref 8.4–10.5)
Chloride: 106 mEq/L (ref 96–112)
Creatinine, Ser: 1 mg/dL (ref 0.4–1.5)
Glucose, Bld: 93 mg/dL (ref 70–99)
Potassium: 4.6 mEq/L (ref 3.5–5.1)

## 2013-09-24 LAB — PROTIME-INR: INR: 1.3 ratio — ABNORMAL HIGH (ref 0.8–1.0)

## 2013-09-24 NOTE — Progress Notes (Signed)
Subjective:    Patient ID: Kirk [name], male    DOB: 02/19/1927, 79 y.o.   MRN: [id number]  HPI  Kirk [name]  is an 79 year old male known to Dr. Leone Payor who is being followed for decompensated cirrhosis complicated by ascites.Kirk [name] He also has history of chronic systolic heart failure with  EF  40 or 45%, atrial fibrilation, coronary artery disease, hypertension and ischemic cardiomyopathy.  He was last seen in the office per Dr. Leone Payor on 09/10/2013. He underwent a large volume paracentesis on 09/10/2013 and had 5.5 L removed. Cytologies and cell counts were negative. Over the past 2 weeks he has been nonsteroidal lactone 50 mg once daily and Lasix 40 mg daily. He has also been started on Xifaxan 550 twice daily for an elevated ammonia level. Comes in with his wife today stating that he's been doing fairly well. His weight is down 6 pounds but he says he continues to have a lot of edema in his legs which are uncomfortable. His wife says she does not think that his abdominal girth has decreased either. He denies any abdominal pain or shortness of breath from ascites. His appetite has been fairly good. He says he does get dyspneic with exertion.  Most recent labs were done on 09/16/2013 BUN was 16 creatinine 1, hemoglobin was 8.1 hematocrit 24.2 platelets 112 and venous ammonia elevated at 40.     Review of Systems  Constitutional: Positive for fatigue.  HENT: Negative.   Eyes: Negative.   Respiratory: Positive for shortness of breath. Negative for choking.   Cardiovascular: Positive for leg swelling.  Gastrointestinal: Positive for abdominal distention.  Endocrine: Negative.   Genitourinary: Negative.   Musculoskeletal: Negative.   Skin: Negative.   Allergic/Immunologic: Negative.   Neurological: Negative.   Hematological: Negative.   Psychiatric/Behavioral: Negative.    Outpatient Prescriptions Prior to Visit  Medication Sig Dispense Refill  . aspirin EC 81 MG tablet Take 1 tablet (81 mg  total) by mouth daily.      . Diclofenac Sodium CR 100 MG 24 hr tablet Take 100 mg by mouth as needed for pain.      Kirk [name] escitalopram (LEXAPRO) 10 MG tablet Take 10 mg by mouth daily.      . finasteride (PROSCAR) 5 MG tablet Take 5 mg by mouth daily.        . furosemide (LASIX) 20 MG tablet Take 2 tablets (40 mg total) by mouth daily.  60 tablet  11  . hyoscyamine (ANASPAZ) 0.125 MG TBDP 1-2 dissolved under tongue every 4 hours as needed for abdominal pain  60 tablet  5  . losartan (COZAAR) 50 MG tablet Take 0.5 tablets (25 mg total) by mouth daily.  60 tablet  6  . oxyCODONE-acetaminophen (PERCOCET/ROXICET) 5-325 MG per tablet Take 1 tablet by mouth every 4 (four) hours as needed for severe pain.      . pantoprazole (PROTONIX) 40 MG tablet 1 by mouth each day 30 minutes before meal  90 tablet  3  . pravastatin (PRAVACHOL) 40 MG tablet Take 1 tablet (40 mg total) by mouth every evening.  90 tablet  1  . rifaximin (XIFAXAN) 550 MG TABS tablet Take 1 tablet (550 mg total) by mouth 2 (two) times daily.  60 tablet  11  . spironolactone (ALDACTONE) 25 MG tablet Take 2 tablets (50 mg total) by mouth daily.  60 tablet  2  . HYDROcodone-acetaminophen (NORCO/VICODIN) 5-325 MG per tablet Take 1 tablet by mouth every 4 (four)  hours as needed for pain.      . iron polysaccharides (NU-IRON) 150 MG capsule Take 150 mg by mouth daily.       No facility-administered medications prior to visit.   Allergies  Allergen Reactions  . Erythromycin Itching and Rash    Patient allergic to all Mycins   . Penicillins Rash   Patient Active Problem List   Diagnosis Date Noted  . Ascites 09/10/2013  . Edema 09/10/2013  . Unstable gait 09/10/2013  . Anemia 09/10/2013  . Bradycardia 08/04/2013  . Esophageal varices in cirrhosis, mild pportal gastropathy 11/14/2012  . Duodenal stricture 11/14/2012  . Cirrhosis of liver without mention of alcohol 11/14/2012  . Systolic CHF, chronic 12/26/2011  . Chronic systolic  heart failure 09/20/2011  . Acute on chronic systolic heart failure 08/24/2011  . Hyperlipidemia 08/24/2011  . Atrial fibrillation 08/24/2011  . Ischemic cardiomyopathy 08/24/2011  . HTN (hypertension) 08/24/2011  . Tobacco abuse 08/24/2011  . CAD (coronary artery disease) 04/07/2011  . Chronic epigastric pain 03/21/2011  . ALKALINE PHOSPHATASE, ELEVATED 11/17/2009  . Dilated bile ducts, unclear significance 11/17/2009   History  Substance Use Topics  . Smoking status: Current Every Day Smoker -- 68 years    Types: Cigars  . Smokeless tobacco: Never Used     Comment: 08/05/2013 "probably 3 cigars/day"  . Alcohol Use: No       Objective:   Physical Exam well-developed elderly white male in no acute distress, accompanied by his wife blood pressure 116/58 pulse 72 height 5 foot 6 weight 150 down 6 pounds. HEENT; nontraumatic normocephalic EOMI PERRLA sclera anicteric, Supple; no JVD, Cardiovascular; regular rate and rhythm with S1-S2 no murmur or gallop pulmonary decreased breath sounds bilaterally abdomen nontender soft ascites nontender no palpable mass or hepatomegaly bowel sounds are present, Rectal ;not done, Extremities; he does have 2+ edema to the knees bilaterally, Psych; mood and affect normal and appropriate no asterixis        Assessment & Plan:  #75  79 year old male with decompensated cirrhosis with ascites -patient appears stable and he is responding to gentle diuresis. Recent paracentesis with negative cytologies. #2 elevated venous ammonia no clinical evidence of encephalopathy patient is now on Xifaxan 550 twice daily #3 portal hypertensive gastropathy #4 normocytic anemia #5 congestive heart failure/ischemic cardiomyopathy #6 history of atrial fibrillation #7 coronary artery disease  Plan; will bump Lasix up from 40 mg by mouth every morning to 60 mg by mouth every morning, and follow renal function carefully Continue Aldactone 50 mg by mouth every  morning Continue Xifaxan 550 twice daily We'll check BMET,CBC,PT and venous ammonia level He will followup with Dr. Leone Payor as previously scheduled in early December Patient was also given his last Twinrix today Injection today

## 2013-09-24 NOTE — Patient Instructions (Signed)
Continue the Xifaxan , take 1 tablet twice daily longterm. Increase the Lasix to 3 tablets every morning. Continue the Aldactone, 2 tablets every morning.  Keep your appointment with Dr. Leone Payor on 10-08-2013 at 10:15 am.

## 2013-09-25 ENCOUNTER — Telehealth: Payer: Self-pay

## 2013-09-25 DIAGNOSIS — R188 Other ascites: Secondary | ICD-10-CM

## 2013-09-25 DIAGNOSIS — R609 Edema, unspecified: Secondary | ICD-10-CM

## 2013-09-25 MED ORDER — SPIRONOLACTONE 25 MG PO TABS: 75.0000 mg | ORAL_TABLET | Freq: Every day | ORAL | Status: DC

## 2013-09-25 NOTE — Telephone Encounter (Signed)
Per office encounter with Mike Gip PA 09/24/13 patient to increase spironolactone to 75 mg q am and BMET prior to office visit 10/08/13.  I have left a detailed message for the patient's wife.  I will call again tomorrow to confirm she got my message.

## 2013-09-25 NOTE — Progress Notes (Signed)
Agree with Ms. Oswald Hillock assessment and plan. Iva Boop, MD, [facility]  I also suggest we increase spironolactone to 75 mg each AM and check BMET a few days before he sees me in Dec - ccing Lavonna Rua

## 2013-09-25 NOTE — Telephone Encounter (Signed)
Message copied by Annett Fabian on Thu Sep 25, 2013  1:39 PM ------      Message from: Stan Head E      Created: Thu Sep 25, 2013  1:29 PM                   ----- Message -----         From: Sammuel Cooper, PA-C         Sent: 09/24/2013  12:34 PM           To: Iva Boop, MD             ------

## 2013-09-26 NOTE — Telephone Encounter (Signed)
Wife aware of the recommendations.  I will contact him to come for labs for 12/1

## 2013-10-08 ENCOUNTER — Encounter: Payer: Self-pay | Admitting: Internal Medicine

## 2013-10-08 ENCOUNTER — Other Ambulatory Visit (INDEPENDENT_AMBULATORY_CARE_PROVIDER_SITE_OTHER): Payer: Medicare Other

## 2013-10-08 ENCOUNTER — Other Ambulatory Visit: Payer: Self-pay

## 2013-10-08 ENCOUNTER — Ambulatory Visit (INDEPENDENT_AMBULATORY_CARE_PROVIDER_SITE_OTHER): Payer: Medicare Other | Admitting: Internal Medicine

## 2013-10-08 VITALS — BP 110/50 | HR 76 | Ht 66.0 in | Wt 150.6 lb

## 2013-10-08 DIAGNOSIS — R609 Edema, unspecified: Secondary | ICD-10-CM

## 2013-10-08 DIAGNOSIS — K7460 Unspecified cirrhosis of liver: Secondary | ICD-10-CM

## 2013-10-08 DIAGNOSIS — R188 Other ascites: Secondary | ICD-10-CM

## 2013-10-08 LAB — BASIC METABOLIC PANEL WITH GFR
BUN: 18 mg/dL (ref 6–23)
CO2: 24 meq/L (ref 19–32)
Calcium: 8.1 mg/dL — ABNORMAL LOW (ref 8.4–10.5)
Chloride: 105 meq/L (ref 96–112)
Creatinine, Ser: 1.1 mg/dL (ref 0.4–1.5)
GFR: 69.92 mL/min
Glucose, Bld: 97 mg/dL (ref 70–99)
Potassium: 4.3 meq/L (ref 3.5–5.1)
Sodium: 136 meq/L (ref 135–145)

## 2013-10-08 MED ORDER — SPIRONOLACTONE 25 MG PO TABS: 50.0000 mg | ORAL_TABLET | Freq: Two times a day (BID) | ORAL | Status: DC

## 2013-10-08 MED ORDER — RIFAXIMIN 550 MG PO TABS: 550.0000 mg | ORAL_TABLET | Freq: Two times a day (BID) | ORAL | Status: DC

## 2013-10-08 MED ORDER — FUROSEMIDE 20 MG PO TABS: 60.0000 mg | ORAL_TABLET | Freq: Two times a day (BID) | ORAL | Status: DC

## 2013-10-08 NOTE — Patient Instructions (Addendum)
Your physician has requested that you go to the basement for the following lab work before leaving today: BMET  We will contact you with results and follow-up plans.  We are providing you with information on advance directives.  I appreciate the opportunity to care for you.

## 2013-10-08 NOTE — Assessment & Plan Note (Signed)
Still with moderate ascites - try to increase diuretics

## 2013-10-08 NOTE — Assessment & Plan Note (Signed)
Stable - need to increase diuretics if possible check BMET

## 2013-10-08 NOTE — Assessment & Plan Note (Signed)
Stable - working on reducing edema Began advanced directives discussion and handout given

## 2013-10-08 NOTE — Progress Notes (Signed)
Subjective:    Patient ID: Kirk [name], male    DOB: [birth year], 79 y.o.   MRN: [id number]  HPI The patient is here with his wife for followup of cirrhosis and edema and ascites. His weight has stabilized. He was last seen by Mike Gip PAC. We increased Spiriva lactone from 50-75 mg daily at that time. He complains of pain in the legs and back pain still. He has not had fever, he has not been confused. His appetite is off some it is difficult and eat large portions with the ascites. Allergies  Allergen Reactions  . Erythromycin Itching and Rash    Patient allergic to all Mycins   . Penicillins Rash   Outpatient Prescriptions Prior to Visit  Medication Sig Dispense Refill  . aspirin EC 81 MG tablet Take 1 tablet (81 mg total) by mouth daily.      . Diclofenac Sodium CR 100 MG 24 hr tablet Take 100 mg by mouth as needed for pain.      Marland Kitchen escitalopram (LEXAPRO) 10 MG tablet Take 10 mg by mouth daily.      . finasteride (PROSCAR) 5 MG tablet Take 5 mg by mouth daily.        . hyoscyamine (ANASPAZ) 0.125 MG TBDP 1-2 dissolved under tongue every 4 hours as needed for abdominal pain  60 tablet  5  . losartan (COZAAR) 50 MG tablet Take 0.5 tablets (25 mg total) by mouth daily.  60 tablet  6  . oxyCODONE-acetaminophen (PERCOCET/ROXICET) 5-325 MG per tablet Take 1 tablet by mouth every 4 (four) hours as needed for severe pain.      . pantoprazole (PROTONIX) 40 MG tablet 1 by mouth each day 30 minutes before meal  90 tablet  3  . pravastatin (PRAVACHOL) 40 MG tablet Take 1 tablet (40 mg total) by mouth every evening.  90 tablet  1  . rifaximin (XIFAXAN) 550 MG TABS tablet Take 1 tablet (550 mg total) by mouth 2 (two) times daily.  60 tablet  11  . spironolactone (ALDACTONE) 25 MG tablet Take 3 tablets (75 mg total) by mouth daily.  90 tablet  3  . furosemide (LASIX) 20 MG tablet Take 2 tablets (40 mg total) by mouth daily.  60 tablet  11  . HYDROcodone-acetaminophen (NORCO/VICODIN)  5-325 MG per tablet Take 1 tablet by mouth every 4 (four) hours as needed for pain.       No facility-administered medications prior to visit.   Past Medical History  Diagnosis Date  . GERD (gastroesophageal reflux disease)   . Diverticulosis     s/p partial colon resection  . CAD (coronary artery disease)     s/p MI 1999;  s/p CABG 2005 (L-LAD, S-Dx, S-OM1, S-PDA); patient had post op AFib;  Myoview 8/10: mild fixed lateral defect; no ischemia, EF 47%  . Hyperlipemia   . Ischemic cardiomyopathy     echo 1/11: mild MR, mild LAE, EF 40-45%;  echo 10/12: EF 40-45%, IL AK, Post AK, grade 2 diast dysfxn, mild to mod MR, mild BAE, PASP 38;   Echo 9/14:  Mild focal basal septal hypertrophy, EF 40-45%, IL, inf and post HK, Gr 1 DD, MAC, mild MR, mild LAE, mild RVE, mild RAE, PASP 32, L pleural effusion  . Hypertension   . Arthritis   . Common bile duct dilatation   . Pancreatitis     post-ERCP 11/2009  . Colitis, ischemic   . Chronic systolic heart failure  Echo 09/05/11: mild focal basal septal hypertrophy, EF 40-45%, basal to mid IL AK, post AK, grade 2 diast dysfxn, mild to mod MR, mild BAE, PASP 38  . Atrial fibrillation     post CABG in 2005 and in setting of acute illness in 1/11 (post ERCP pancreatitis) - coumadin not felt to be indicated  . Lumbar spondylosis   . Elevated LFTs     statin d/c'd in past due to   . BPH (benign prostatic hyperplasia)   . Sinus bradycardia     on beta blockers  . Ulcer 10/2012    in pylorus  . Allergy   . Anemia   . Diverticulitis     with partial colectomy  . Cirrhosis     Negative workup for viral hepatitis, autoimmune hepatitis, hemochromatosis. Never heavy ETOH. Suspect NASH. EGD (12/13) with esophageal varices and portal hypertensive gastropathy. EGD (3/14) with 3 small esophageal varices, portal hypertensive gastropathy.  . Myocardial infarction 1991; 1999  . Esophageal varices in cirrhosis     Hattie Perch 08/04/2013 (08/05/2013)  . Depression     . Anxiety   . Hyperkalemia requiring hospitalization 08/04/2013  . Cirrhosis    Past Surgical History  Procedure Laterality Date  . Colon resection      "removed 12-15' of colon" (08/05/2013)  . Back surgery    . Ercp  11/13/2009    dilated bile ducts, atypical cytology, repeat attempt normal pancreatogram but unsuccessful biliary cannulation  . Angioplasty  1999  . Shoulder open rotator cuff repair Bilateral     "I've had a total of 3 OR's for this" (08/05/2013)  . Cataract extraction w/ intraocular lens implant Right   . Esophagogastroduodenoscopy    . Flexible sigmoidoscopy    . Colonoscopy    . Tonsillectomy    . Coronary angioplasty with stent placement      "before the bypass" (08/05/2013)  . Coronary artery bypass graft  2005    "CABG X7" (08/05/2013)x 4  . Lumbar disc surgery     Review of Systems As per history of present illness    Objective:   Physical Exam General:  NAD Eyes:   anicteric Lungs:  Clear with slight crackles at right base Heart:  S1S2 no rubs, murmurs or gallops Abdomen:  soft and nontender, BS+, moderate ascites Ext:   2+ edema into both pre-tibial areas   Data Reviewed:    Chemistry      Component Value Date/Time   NA 136 09/24/2013 0926   K 4.6 09/24/2013 0926   CL 106 09/24/2013 0926   CO2 28 09/24/2013 0926   BUN 19 09/24/2013 0926   CREATININE 1.0 09/24/2013 0926      Component Value Date/Time   CALCIUM 8.5 09/24/2013 0926   ALKPHOS 119* 08/05/2013 0640   AST 27 08/05/2013 0640   ALT 14 08/05/2013 0640   BILITOT 0.6 08/05/2013 0640        Assessment & Plan:   1. Edema   2. Cirrhosis of liver without mention of alcohol   3. Ascites

## 2013-10-21 ENCOUNTER — Telehealth: Payer: Self-pay

## 2013-10-21 NOTE — Telephone Encounter (Signed)
[facility] called and wanted to know the latest directions on patients Lasix.   We sent it in 10/08/13 to [facility] and it has not come yet.  [facility] is going to tide her over with a few lasix until mail order comes.  I called [facility] and they said it shipped out 10/19/13.

## 2013-10-22 ENCOUNTER — Other Ambulatory Visit (INDEPENDENT_AMBULATORY_CARE_PROVIDER_SITE_OTHER): Payer: Medicare Other

## 2013-10-22 DIAGNOSIS — R609 Edema, unspecified: Secondary | ICD-10-CM

## 2013-10-22 DIAGNOSIS — R188 Other ascites: Secondary | ICD-10-CM

## 2013-10-22 LAB — BASIC METABOLIC PANEL
Calcium: 7.8 mg/dL — ABNORMAL LOW (ref 8.4–10.5)
Chloride: 105 mEq/L (ref 96–112)
GFR: 61.84 mL/min (ref >60.00)
Glucose, Bld: 113 mg/dL — ABNORMAL HIGH (ref 70–99)
Potassium: 3.8 mEq/L (ref 3.5–5.1)
Sodium: 136 mEq/L (ref 135–145)

## 2013-10-23 NOTE — Progress Notes (Signed)
Quick Note:  Kidney function ok with increase in diuretics Ask about weight (#, trend) and edema, abdominal girth ______

## 2013-10-23 NOTE — Progress Notes (Signed)
Quick Note:  Increase spironolactone to 100 mg bid and get BMEt in 10 days approx ______

## 2013-10-27 ENCOUNTER — Telehealth: Payer: Self-pay | Admitting: Internal Medicine

## 2013-10-27 NOTE — Telephone Encounter (Signed)
I   think we should stop Xifaxan  For awhile - see if that is causing rash- he can use benadryl cream  As needed. Ask them to watch for signs of confusion because he wont be on anything for encephalopathy. Don tbelieve he reaaly has had problem  With that though

## 2013-10-27 NOTE — Telephone Encounter (Signed)
Patient with several weeks of rash on back and feet.  Patient is scratching and making himself bleed.  [name] PA please review.  Do we need to make any med changes

## 2013-10-27 NOTE — Telephone Encounter (Signed)
Patient's wife advised.  She will call back for additional questions or concerns

## 2013-11-04 ENCOUNTER — Telehealth: Payer: Self-pay | Admitting: Internal Medicine

## 2013-11-04 MED ORDER — LACTULOSE 10 GM/15ML PO SOLN: 20.0000 g | Freq: Every day | ORAL | Status: DC

## 2013-11-04 NOTE — Telephone Encounter (Signed)
Leave off Xifaxan - dc from med list and add allergy   rash possible from this  Lactulose 1 tablespoon daily - enough for 1 month and 5 refills   Warn that may make stools loose and to call if diarrhea problems Probably will not do that on low-dose

## 2013-11-04 NOTE — Telephone Encounter (Signed)
Patient's wife advised

## 2013-11-04 NOTE — Telephone Encounter (Signed)
He will come for labs today or tomorrow See results from 10/24/13

## 2013-11-04 NOTE — Telephone Encounter (Signed)
See phone note from 10/27/13.  His wife wants Dr. Leone Payor to know that the rash is almost completely gone after being off since 10/27/13.  Wife wants to know if you want him to try it again and see if the rash returns?  Please advise

## 2013-11-05 ENCOUNTER — Other Ambulatory Visit (INDEPENDENT_AMBULATORY_CARE_PROVIDER_SITE_OTHER): Payer: Medicare Other

## 2013-11-05 DIAGNOSIS — K7460 Unspecified cirrhosis of liver: Secondary | ICD-10-CM

## 2013-11-05 DIAGNOSIS — R188 Other ascites: Secondary | ICD-10-CM

## 2013-11-05 DIAGNOSIS — R609 Edema, unspecified: Secondary | ICD-10-CM

## 2013-11-05 LAB — BASIC METABOLIC PANEL
BUN: 23 mg/dL (ref 6–23)
CO2: 31 mEq/L (ref 19–32)
Calcium: 8.1 mg/dL — ABNORMAL LOW (ref 8.4–10.5)
GFR: 61.24 mL/min (ref >60.00)
Glucose, Bld: 121 mg/dL — ABNORMAL HIGH (ref 70–99)
Potassium: 3.9 mEq/L (ref 3.5–5.1)

## 2013-11-05 NOTE — Progress Notes (Signed)
Quick Note:  Labs ok  How is weight and edema? ______

## 2013-11-07 ENCOUNTER — Other Ambulatory Visit: Payer: Self-pay

## 2013-11-07 DIAGNOSIS — R188 Other ascites: Secondary | ICD-10-CM

## 2013-11-07 NOTE — Progress Notes (Signed)
Quick Note:  Increase spironolactone to 100 mg bid and do BMET before next visit 1/13 ______

## 2013-11-17 ENCOUNTER — Other Ambulatory Visit (INDEPENDENT_AMBULATORY_CARE_PROVIDER_SITE_OTHER): Payer: Medicare Other

## 2013-11-17 DIAGNOSIS — R188 Other ascites: Secondary | ICD-10-CM

## 2013-11-17 LAB — BASIC METABOLIC PANEL
BUN: 23 mg/dL (ref 6–23)
CO2: 30 mEq/L (ref 19–32)
Calcium: 8.3 mg/dL — ABNORMAL LOW (ref 8.4–10.5)
Chloride: 104 mEq/L (ref 96–112)
Creatinine, Ser: 1.2 mg/dL (ref 0.4–1.5)
GFR: 58.96 mL/min — ABNORMAL LOW (ref >60.00)
Glucose, Bld: 129 mg/dL — ABNORMAL HIGH (ref 70–99)
Potassium: 3.6 mEq/L (ref 3.5–5.1)
Sodium: 138 mEq/L (ref 135–145)

## 2013-11-18 ENCOUNTER — Ambulatory Visit (INDEPENDENT_AMBULATORY_CARE_PROVIDER_SITE_OTHER): Payer: Medicare Other | Admitting: Internal Medicine

## 2013-11-18 ENCOUNTER — Other Ambulatory Visit (INDEPENDENT_AMBULATORY_CARE_PROVIDER_SITE_OTHER): Payer: Medicare Other

## 2013-11-18 ENCOUNTER — Encounter: Payer: Self-pay | Admitting: Internal Medicine

## 2013-11-18 VITALS — BP 108/50 | HR 60 | Ht 66.75 in | Wt 127.0 lb

## 2013-11-18 DIAGNOSIS — R188 Other ascites: Secondary | ICD-10-CM

## 2013-11-18 DIAGNOSIS — K7460 Unspecified cirrhosis of liver: Secondary | ICD-10-CM

## 2013-11-18 DIAGNOSIS — D649 Anemia, unspecified: Secondary | ICD-10-CM

## 2013-11-18 DIAGNOSIS — R609 Edema, unspecified: Secondary | ICD-10-CM

## 2013-11-18 LAB — CBC WITH DIFFERENTIAL/PLATELET
BASOS PCT: 0.6 % (ref 0.0–3.0)
Basophils Absolute: 0 10*3/uL (ref 0.0–0.1)
EOS PCT: 3.1 % (ref 0.0–5.0)
Eosinophils Absolute: 0.2 10*3/uL (ref 0.0–0.7)
HCT: 24.8 % — ABNORMAL LOW (ref 39.0–52.0)
LYMPHS ABS: 1.1 10*3/uL (ref 0.7–4.0)
Lymphocytes Relative: 16.2 % (ref 12.0–46.0)
MCHC: 33.5 g/dL (ref 30.0–36.0)
MCV: 99 fl (ref 78.0–100.0)
MONO ABS: 0.8 10*3/uL (ref 0.1–1.0)
Monocytes Relative: 12 % (ref 3.0–12.0)
NEUTROS ABS: 4.6 10*3/uL (ref 1.4–7.7)
Neutrophils Relative %: 68.1 % (ref 43.0–77.0)
Platelets: 138 10*3/uL — ABNORMAL LOW (ref 150.0–400.0)
RDW: 17.5 % — ABNORMAL HIGH (ref 11.5–14.6)
WBC: 6.8 10*3/uL (ref 4.5–10.5)

## 2013-11-18 MED ORDER — HYDROCODONE-ACETAMINOPHEN 5-325 MG PO TABS: 1.0000 | ORAL_TABLET | Freq: Four times a day (QID) | ORAL | Status: DC | PRN

## 2013-11-18 MED ORDER — FUROSEMIDE 20 MG PO TABS: 120.0000 mg | ORAL_TABLET | Freq: Every day | ORAL | Status: DC

## 2013-11-18 MED ORDER — SPIRONOLACTONE 25 MG PO TABS: 200.0000 mg | ORAL_TABLET | Freq: Every day | ORAL | Status: DC

## 2013-11-18 NOTE — Assessment & Plan Note (Signed)
CBC, ferritin and B12 today

## 2013-11-18 NOTE — Patient Instructions (Addendum)
Your physician has requested that you go to the basement for the following lab work before leaving today: CBC, Ferritin, Vitamin B12  Dr. Leone PayorGessner has modified your medicines, the lasix and spirolactone will be     We are giving you a printed rx for vicodin to take to your pharmacy.    I appreciate the opportunity to care for you.

## 2013-11-18 NOTE — Assessment & Plan Note (Signed)
Improved Consolidate diuretics

## 2013-11-18 NOTE — Assessment & Plan Note (Addendum)
Improved continue current diuretic dose in but will consolidate to once a day

## 2013-11-18 NOTE — Progress Notes (Signed)
Subjective:    Patient ID: Kirk [name], male    DOB: 1927-03-08, 80 y.o.   MRN: [id number]  HPI The patient is here for followup of cirrhosis complicated by ascites and edema. He had called in in December with a rash, it was thought that it might be from [place], he had a macular papular rash her description on his back. That resolved stopping Xifaxan.  He finally started to lose weight with an increase in diuretics. I was titrating up carefully watching his sodium and potassium etc. because of previous issues and he has tolerated this and finally started to lose weight and fluid in his legs. He still has pain in the back and legs, he ran out of his narcotics a little bit early, his wife is asked if I refill those. She had been pulling out his medications but he is out of his hydrocodone. I explained that I would refill it today but further refills would need to come through Dr. Jacky Kindle.  No confusion. He is finally able across his legs and high issues with the reduction in edema. His wife is hoping to plan and carry out a 60th wedding anniversary celebration in April.  Denies abdominal pain. Still having some difficulty with eating he can only eat small amounts.   Allergies  Allergen Reactions  . Erythromycin Itching and Rash    Patient allergic to all Mycins   . Penicillins Rash  . Xifaxan [Rifaximin] Rash   Outpatient Prescriptions Prior to Visit  Medication Sig Dispense Refill  . aspirin EC 81 MG tablet Take 1 tablet (81 mg total) by mouth daily.      . Diclofenac Sodium CR 100 MG 24 hr tablet Take 100 mg by mouth as needed for pain.      Marland Kitchen escitalopram (LEXAPRO) 10 MG tablet Take 10 mg by mouth daily.      . finasteride (PROSCAR) 5 MG tablet Take 5 mg by mouth daily.        . hyoscyamine (ANASPAZ) 0.125 MG TBDP 1-2 dissolved under tongue every 4 hours as needed for abdominal pain  60 tablet  5  . lactulose (CHRONULAC) 10 GM/15ML solution Take 30 mLs (20 g total) by  mouth daily.  960 mL  5  . losartan (COZAAR) 50 MG tablet Take 0.5 tablets (25 mg total) by mouth daily.  60 tablet  6  . pantoprazole (PROTONIX) 40 MG tablet 1 by mouth each day 30 minutes before meal  90 tablet  3  . pravastatin (PRAVACHOL) 40 MG tablet Take 1 tablet (40 mg total) by mouth every evening.  90 tablet  1  . furosemide (LASIX) 20 MG tablet Take 3 tablets (60 mg total) by mouth 2 (two) times daily.  540 tablet  3  . spironolactone (ALDACTONE) 25 MG tablet Take 2 tablets (50 mg total) by mouth 2 (two) times daily.  360 tablet  3  . oxyCODONE-acetaminophen (PERCOCET/ROXICET) 5-325 MG per tablet Take 1 tablet by mouth every 4 (four) hours as needed for severe pain.       No facility-administered medications prior to visit.   Past Medical History  Diagnosis Date  . GERD (gastroesophageal reflux disease)   . Diverticulosis     s/p partial colon resection  . CAD (coronary artery disease)     s/p MI 1999;  s/p CABG 2005 (L-LAD, S-Dx, S-OM1, S-PDA); patient had post op AFib;  Myoview 8/10: mild fixed lateral defect; no ischemia, EF 47%  .  Hyperlipemia   . Ischemic cardiomyopathy     echo 1/11: mild MR, mild LAE, EF 40-45%;  echo 10/12: EF 40-45%, IL AK, Post AK, grade 2 diast dysfxn, mild to mod MR, mild BAE, PASP 38;   Echo 9/14:  Mild focal basal septal hypertrophy, EF 40-45%, IL, inf and post HK, Gr 1 DD, MAC, mild MR, mild LAE, mild RVE, mild RAE, PASP 32, L pleural effusion  . Hypertension   . Arthritis   . Common bile duct dilatation   . Pancreatitis     post-ERCP 11/2009  . Colitis, ischemic   . Chronic systolic heart failure     Echo 09/05/11: mild focal basal septal hypertrophy, EF 40-45%, basal to mid IL AK, post AK, grade 2 diast dysfxn, mild to mod MR, mild BAE, PASP 38  . Atrial fibrillation     post CABG in 2005 and in setting of acute illness in 1/11 (post ERCP pancreatitis) - coumadin not felt to be indicated  . Lumbar spondylosis   . Elevated LFTs     statin  d/c'd in past due to   . BPH (benign prostatic hyperplasia)   . Sinus bradycardia     on beta blockers  . Ulcer 10/2012    in pylorus  . Allergy   . Anemia   . Diverticulitis     with partial colectomy  . Cirrhosis     Negative workup for viral hepatitis, autoimmune hepatitis, hemochromatosis. Never heavy ETOH. Suspect NASH. EGD (12/13) with esophageal varices and portal hypertensive gastropathy. EGD (3/14) with 3 small esophageal varices, portal hypertensive gastropathy.  . Myocardial infarction 1991; 1999  . Esophageal varices in cirrhosis     Hattie Perch/notes 08/04/2013 (08/05/2013)  . Depression   . Anxiety   . Hyperkalemia requiring hospitalization 08/04/2013  . Cirrhosis    Past Surgical History  Procedure Laterality Date  . Colon resection      "removed 12-15' of colon" (08/05/2013)  . Back surgery    . Ercp  11/13/2009    dilated bile ducts, atypical cytology, repeat attempt normal pancreatogram but unsuccessful biliary cannulation  . Angioplasty  1999  . Shoulder open rotator cuff repair Bilateral     "I've had a total of 3 OR's for this" (08/05/2013)  . Cataract extraction w/ intraocular lens implant Right   . Esophagogastroduodenoscopy    . Flexible sigmoidoscopy    . Colonoscopy    . Tonsillectomy    . Coronary angioplasty with stent placement      "before the bypass" (08/05/2013)  . Coronary artery bypass graft  2005    "CABG X7" (08/05/2013)x 4  . Lumbar disc surgery      Review of Systems As above    Objective:   Physical Exam General:  NAD Eyes:   anicteric Lungs:  clear Heart:  S1S2 no rubs, murmurs or gallops Abdomen:  soft and nontender, BS+, there is a small-to-moderate amount of ascites still Ext:   1+ bilateral lower extremity edema into the upper pretibial    Data Reviewed:    Chemistry      Component Value Date/Time   NA 138 11/17/2013 0920   K 3.6 11/17/2013 0920   CL 104 11/17/2013 0920   CO2 30 11/17/2013 0920   BUN 23 11/17/2013 0920   CREATININE  1.2 11/17/2013 0920      Component Value Date/Time   CALCIUM 8.3* 11/17/2013 0920   ALKPHOS 119* 08/05/2013 0640   AST 27 08/05/2013 0640  ALT 14 08/05/2013 0640   BILITOT 0.6 08/05/2013 0640     Wt Readings from Last 3 Encounters:  11/18/13 127 lb (57.607 kg)  10/08/13 150 lb 9.6 oz (68.312 kg)  09/24/13 150 lb 3.2 oz (68.13 kg)       Assessment & Plan:   1. Edema   2. Ascites   3. Anemia   4. Cirrhosis of liver without mention of alcohol      Please see problem oriented charting. Overall he is significantly improved. I did give him a one month refill on his hydrocodone with acetaminophen but asked him to followup with Dr. Jacky Kindle about that further. One of his main complaint is not mentioned above was in the urinates all day. We'll try to consolidate all of his diuretic doses into the morning to see if he tolerates that. I am reluctant to reduce the amount of diuretics as it took a large amount to diurese him and he still has edema and ascites. I plan to see him back in 2 months.  CC: Minda Meo, MD

## 2013-11-18 NOTE — Assessment & Plan Note (Addendum)
Stable Explained not alcoholic in origin, he has had quite a few questions from his friends and neighbors about drinking and would like to tell him that that was not the cause

## 2013-11-19 ENCOUNTER — Other Ambulatory Visit: Payer: Self-pay

## 2013-11-19 DIAGNOSIS — K7460 Unspecified cirrhosis of liver: Secondary | ICD-10-CM

## 2013-11-19 LAB — FERRITIN: FERRITIN: 39.6 ng/mL (ref 22.0–322.0)

## 2013-11-19 LAB — VITAMIN B12: Vitamin B-12: 566 pg/mL (ref 211–911)

## 2013-11-21 ENCOUNTER — Telehealth: Payer: Self-pay | Admitting: Internal Medicine

## 2013-11-21 ENCOUNTER — Emergency Department (HOSPITAL_COMMUNITY): Payer: Medicare Other

## 2013-11-21 ENCOUNTER — Encounter (HOSPITAL_COMMUNITY): Payer: Self-pay | Admitting: Emergency Medicine

## 2013-11-21 ENCOUNTER — Inpatient Hospital Stay (HOSPITAL_COMMUNITY)
Admission: EM | Admit: 2013-11-21 | Discharge: 2013-11-24 | DRG: 441 | Disposition: A | Discharge status: Home / Self Care | Payer: Medicare Other | Attending: Internal Medicine | Admitting: Internal Medicine

## 2013-11-21 DIAGNOSIS — Z681 Body mass index (BMI) 19 or less, adult: Secondary | ICD-10-CM

## 2013-11-21 DIAGNOSIS — I2510 Atherosclerotic heart disease of native coronary artery without angina pectoris: Secondary | ICD-10-CM

## 2013-11-21 DIAGNOSIS — E860 Dehydration: Secondary | ICD-10-CM | POA: Diagnosis present

## 2013-11-21 DIAGNOSIS — E43 Unspecified severe protein-calorie malnutrition: Secondary | ICD-10-CM | POA: Diagnosis present

## 2013-11-21 DIAGNOSIS — I255 Ischemic cardiomyopathy: Secondary | ICD-10-CM | POA: Diagnosis present

## 2013-11-21 DIAGNOSIS — I272 Pulmonary hypertension, unspecified: Secondary | ICD-10-CM | POA: Diagnosis present

## 2013-11-21 DIAGNOSIS — I2589 Other forms of chronic ischemic heart disease: Secondary | ICD-10-CM | POA: Diagnosis present

## 2013-11-21 DIAGNOSIS — K315 Obstruction of duodenum: Secondary | ICD-10-CM | POA: Diagnosis present

## 2013-11-21 DIAGNOSIS — Z951 Presence of aortocoronary bypass graft: Secondary | ICD-10-CM

## 2013-11-21 DIAGNOSIS — K7682 Hepatic encephalopathy: Principal | ICD-10-CM | POA: Diagnosis present

## 2013-11-21 DIAGNOSIS — I129 Hypertensive chronic kidney disease with stage 1 through stage 4 chronic kidney disease, or unspecified chronic kidney disease: Secondary | ICD-10-CM | POA: Diagnosis present

## 2013-11-21 DIAGNOSIS — K7290 Hepatic failure, unspecified without coma: Principal | ICD-10-CM

## 2013-11-21 DIAGNOSIS — K319 Disease of stomach and duodenum, unspecified: Secondary | ICD-10-CM | POA: Diagnosis present

## 2013-11-21 DIAGNOSIS — Z881 Allergy status to other antibiotic agents status: Secondary | ICD-10-CM

## 2013-11-21 DIAGNOSIS — R1013 Epigastric pain: Secondary | ICD-10-CM | POA: Diagnosis present

## 2013-11-21 DIAGNOSIS — K5900 Constipation, unspecified: Secondary | ICD-10-CM | POA: Diagnosis present

## 2013-11-21 DIAGNOSIS — G2581 Restless legs syndrome: Secondary | ICD-10-CM | POA: Diagnosis present

## 2013-11-21 DIAGNOSIS — K219 Gastro-esophageal reflux disease without esophagitis: Secondary | ICD-10-CM | POA: Diagnosis present

## 2013-11-21 DIAGNOSIS — I509 Heart failure, unspecified: Secondary | ICD-10-CM | POA: Diagnosis present

## 2013-11-21 DIAGNOSIS — I252 Old myocardial infarction: Secondary | ICD-10-CM

## 2013-11-21 DIAGNOSIS — I5042 Chronic combined systolic (congestive) and diastolic (congestive) heart failure: Secondary | ICD-10-CM | POA: Diagnosis present

## 2013-11-21 DIAGNOSIS — I10 Essential (primary) hypertension: Secondary | ICD-10-CM | POA: Diagnosis present

## 2013-11-21 DIAGNOSIS — N289 Disorder of kidney and ureter, unspecified: Secondary | ICD-10-CM | POA: Diagnosis present

## 2013-11-21 DIAGNOSIS — K7460 Unspecified cirrhosis of liver: Secondary | ICD-10-CM | POA: Diagnosis present

## 2013-11-21 DIAGNOSIS — I4891 Unspecified atrial fibrillation: Secondary | ICD-10-CM | POA: Diagnosis present

## 2013-11-21 DIAGNOSIS — G8929 Other chronic pain: Secondary | ICD-10-CM | POA: Diagnosis present

## 2013-11-21 DIAGNOSIS — Z880 Allergy status to penicillin: Secondary | ICD-10-CM

## 2013-11-21 DIAGNOSIS — Z791 Long term (current) use of non-steroidal anti-inflammatories (NSAID): Secondary | ICD-10-CM

## 2013-11-21 DIAGNOSIS — R609 Edema, unspecified: Secondary | ICD-10-CM

## 2013-11-21 DIAGNOSIS — F17200 Nicotine dependence, unspecified, uncomplicated: Secondary | ICD-10-CM | POA: Diagnosis present

## 2013-11-21 DIAGNOSIS — I2789 Other specified pulmonary heart diseases: Secondary | ICD-10-CM | POA: Diagnosis present

## 2013-11-21 DIAGNOSIS — K7689 Other specified diseases of liver: Secondary | ICD-10-CM | POA: Diagnosis present

## 2013-11-21 DIAGNOSIS — N189 Chronic kidney disease, unspecified: Secondary | ICD-10-CM | POA: Diagnosis present

## 2013-11-21 DIAGNOSIS — R188 Other ascites: Secondary | ICD-10-CM

## 2013-11-21 DIAGNOSIS — I8510 Secondary esophageal varices without bleeding: Secondary | ICD-10-CM | POA: Diagnosis present

## 2013-11-21 DIAGNOSIS — K5730 Diverticulosis of large intestine without perforation or abscess without bleeding: Secondary | ICD-10-CM | POA: Diagnosis present

## 2013-11-21 DIAGNOSIS — R7989 Other specified abnormal findings of blood chemistry: Secondary | ICD-10-CM

## 2013-11-21 DIAGNOSIS — D649 Anemia, unspecified: Secondary | ICD-10-CM | POA: Diagnosis present

## 2013-11-21 DIAGNOSIS — N179 Acute kidney failure, unspecified: Secondary | ICD-10-CM | POA: Diagnosis present

## 2013-11-21 DIAGNOSIS — I5023 Acute on chronic systolic (congestive) heart failure: Secondary | ICD-10-CM

## 2013-11-21 DIAGNOSIS — E785 Hyperlipidemia, unspecified: Secondary | ICD-10-CM | POA: Diagnosis present

## 2013-11-21 DIAGNOSIS — Z720 Tobacco use: Secondary | ICD-10-CM | POA: Diagnosis present

## 2013-11-21 DIAGNOSIS — K766 Portal hypertension: Secondary | ICD-10-CM | POA: Diagnosis present

## 2013-11-21 DIAGNOSIS — G9340 Encephalopathy, unspecified: Secondary | ICD-10-CM | POA: Diagnosis present

## 2013-11-21 DIAGNOSIS — R279 Unspecified lack of coordination: Secondary | ICD-10-CM | POA: Diagnosis present

## 2013-11-21 DIAGNOSIS — Z7982 Long term (current) use of aspirin: Secondary | ICD-10-CM

## 2013-11-21 DIAGNOSIS — Z8249 Family history of ischemic heart disease and other diseases of the circulatory system: Secondary | ICD-10-CM

## 2013-11-21 DIAGNOSIS — M129 Arthropathy, unspecified: Secondary | ICD-10-CM | POA: Diagnosis present

## 2013-11-21 DIAGNOSIS — Z79899 Other long term (current) drug therapy: Secondary | ICD-10-CM

## 2013-11-21 LAB — CBC WITH DIFFERENTIAL/PLATELET
BASOS PCT: 1 % (ref 0–1)
Basophils Absolute: 0 10*3/uL (ref 0.0–0.1)
EOS PCT: 3 % (ref 0–5)
Eosinophils Absolute: 0.2 10*3/uL (ref 0.0–0.7)
HEMATOCRIT: 24 % — AB (ref 39.0–52.0)
HEMOGLOBIN: 8.2 g/dL — AB (ref 13.0–17.0)
Lymphocytes Relative: 23 % (ref 12–46)
Lymphs Abs: 1.2 10*3/uL (ref 0.7–4.0)
MCH: 32.9 pg (ref 26.0–34.0)
MCHC: 34.2 g/dL (ref 30.0–36.0)
MCV: 96.4 fL (ref 78.0–100.0)
MONO ABS: 0.8 10*3/uL (ref 0.1–1.0)
MONOS PCT: 15 % — AB (ref 3–12)
Neutro Abs: 3 10*3/uL (ref 1.7–7.7)
Neutrophils Relative %: 58 % (ref 43–77)
Platelets: 127 10*3/uL — ABNORMAL LOW (ref 150–400)
RBC: 2.49 MIL/uL — ABNORMAL LOW (ref 4.22–5.81)
RDW: 16.2 % — ABNORMAL HIGH (ref 11.5–15.5)
WBC: 5.1 10*3/uL (ref 4.0–10.5)

## 2013-11-21 LAB — POCT I-STAT TROPONIN I: Troponin i, poc: 0.02 ng/mL (ref 0.00–0.08)

## 2013-11-21 LAB — COMPREHENSIVE METABOLIC PANEL
ALBUMIN: 2.3 g/dL — AB (ref 3.5–5.2)
ALT: 13 U/L (ref 0–53)
AST: 25 U/L (ref 0–37)
Alkaline Phosphatase: 170 U/L — ABNORMAL HIGH (ref 39–117)
BILIRUBIN TOTAL: 0.6 mg/dL (ref 0.3–1.2)
BUN: 51 mg/dL — AB (ref 6–23)
CHLORIDE: 103 meq/L (ref 96–112)
CO2: 25 mEq/L (ref 19–32)
CREATININE: 2.37 mg/dL — AB (ref 0.50–1.35)
Calcium: 8.3 mg/dL — ABNORMAL LOW (ref 8.4–10.5)
GFR calc Af Amer: 27 mL/min — ABNORMAL LOW (ref >90)
GFR calc non Af Amer: 24 mL/min — ABNORMAL LOW (ref >90)
Glucose, Bld: 115 mg/dL — ABNORMAL HIGH (ref 70–99)
Potassium: 5.2 mEq/L (ref 3.7–5.3)
Sodium: 140 mEq/L (ref 137–147)
Total Protein: 7.1 g/dL (ref 6.0–8.3)

## 2013-11-21 LAB — URINALYSIS, ROUTINE W REFLEX MICROSCOPIC
Bilirubin Urine: NEGATIVE
GLUCOSE, UA: NEGATIVE mg/dL
Hgb urine dipstick: NEGATIVE
Ketones, ur: NEGATIVE mg/dL
LEUKOCYTES UA: NEGATIVE
NITRITE: NEGATIVE
PROTEIN: NEGATIVE mg/dL
Specific Gravity, Urine: 1.009 (ref 1.005–1.030)
UROBILINOGEN UA: 0.2 mg/dL (ref 0.0–1.0)
pH: 6 (ref 5.0–8.0)

## 2013-11-21 LAB — HEMOGLOBIN A1C
Hgb A1c MFr Bld: 5.5 % (ref <5.7)
Mean Plasma Glucose: 111 mg/dL (ref <117)

## 2013-11-21 LAB — AMMONIA: Ammonia: 142 umol/L — ABNORMAL HIGH (ref 11–60)

## 2013-11-21 LAB — PROTIME-INR
INR: 1.27 (ref 0.00–1.49)
Prothrombin Time: 15.6 seconds — ABNORMAL HIGH (ref 11.6–15.2)

## 2013-11-21 LAB — TROPONIN I: Troponin I: <0.3 ng/mL (ref <0.30)

## 2013-11-21 LAB — GLUCOSE, CAPILLARY: Glucose-Capillary: 131 mg/dL — ABNORMAL HIGH (ref 70–99)

## 2013-11-21 LAB — PRO B NATRIURETIC PEPTIDE: PRO B NATRI PEPTIDE: 1437 pg/mL — AB (ref 0–450)

## 2013-11-21 MED ORDER — PANTOPRAZOLE SODIUM 40 MG PO TBEC
40.0000 mg | DELAYED_RELEASE_TABLET | Freq: Two times a day (BID) | ORAL | Status: DC
  Administered 2013-11-21 – 2013-11-24 (×6): 40 mg via ORAL
  Filled 2013-11-21 (×8): qty 1

## 2013-11-21 MED ORDER — FLEET ENEMA 7-19 GM/118ML RE ENEM
2.0000 | ENEMA | Freq: Once | RECTAL | Status: AC
  Administered 2013-11-21: 2 via RECTAL
  Filled 2013-11-21: qty 2

## 2013-11-21 MED ORDER — ONDANSETRON HCL 4 MG/2ML IJ SOLN
4.0000 mg | Freq: Four times a day (QID) | INTRAMUSCULAR | Status: DC | PRN
  Administered 2013-11-21: 4 mg via INTRAVENOUS
  Filled 2013-11-21: qty 2

## 2013-11-21 MED ORDER — DOCUSATE SODIUM 100 MG PO CAPS
100.0000 mg | ORAL_CAPSULE | Freq: Two times a day (BID) | ORAL | Status: DC | PRN
  Filled 2013-11-21: qty 1

## 2013-11-21 MED ORDER — PANTOPRAZOLE SODIUM 40 MG PO TBEC: 40.0000 mg | DELAYED_RELEASE_TABLET | Freq: Every day | ORAL | Status: DC

## 2013-11-21 MED ORDER — ESCITALOPRAM OXALATE 10 MG PO TABS
10.0000 mg | ORAL_TABLET | Freq: Every day | ORAL | Status: DC
  Administered 2013-11-21 – 2013-11-23 (×3): 10 mg via ORAL
  Filled 2013-11-21 (×4): qty 1

## 2013-11-21 MED ORDER — LACTULOSE 10 GM/15ML PO SOLN
20.0000 g | Freq: Four times a day (QID) | ORAL | Status: DC
  Administered 2013-11-21: 20 g via ORAL
  Filled 2013-11-21 (×6): qty 30

## 2013-11-21 MED ORDER — FERROUS SULFATE 325 (65 FE) MG PO TABS
325.0000 mg | ORAL_TABLET | Freq: Two times a day (BID) | ORAL | Status: DC
  Administered 2013-11-22 – 2013-11-24 (×5): 325 mg via ORAL
  Filled 2013-11-21 (×7): qty 1

## 2013-11-21 MED ORDER — SODIUM CHLORIDE 0.9 % IV BOLUS (SEPSIS)
500.0000 mL | Freq: Once | INTRAVENOUS | Status: AC
  Administered 2013-11-21: 500 mL via INTRAVENOUS

## 2013-11-21 MED ORDER — SODIUM CHLORIDE 0.9 % IV SOLN
INTRAVENOUS | Status: DC
  Administered 2013-11-21: 18:00:00 via INTRAVENOUS

## 2013-11-21 MED ORDER — SODIUM CHLORIDE 0.45 % IV SOLN
INTRAVENOUS | Status: DC
  Administered 2013-11-21: 17:00:00 via INTRAVENOUS
  Administered 2013-11-23: 20 mL/h via INTRAVENOUS

## 2013-11-21 MED ORDER — FINASTERIDE 5 MG PO TABS
5.0000 mg | ORAL_TABLET | Freq: Every day | ORAL | Status: DC
  Administered 2013-11-22 – 2013-11-24 (×3): 5 mg via ORAL
  Filled 2013-11-21 (×3): qty 1

## 2013-11-21 MED ORDER — OXYCODONE HCL 5 MG PO TABS
5.0000 mg | ORAL_TABLET | ORAL | Status: DC | PRN
  Administered 2013-11-22 – 2013-11-24 (×7): 5 mg via ORAL
  Filled 2013-11-21 (×7): qty 1

## 2013-11-21 MED ORDER — ESCITALOPRAM OXALATE 10 MG PO TABS: 10.0000 mg | ORAL_TABLET | Freq: Every day | ORAL | Status: DC

## 2013-11-21 MED ORDER — LACTULOSE 10 GM/15ML PO SOLN
10.0000 g | Freq: Once | ORAL | Status: AC
  Administered 2013-11-21: 10 g via ORAL
  Filled 2013-11-21: qty 15

## 2013-11-21 MED ORDER — HYOSCYAMINE SULFATE 0.125 MG PO TBDP
0.1250 mg | ORAL_TABLET | Freq: Two times a day (BID) | ORAL | Status: DC
  Administered 2013-11-21 – 2013-11-24 (×6): 0.125 mg via SUBLINGUAL
  Filled 2013-11-21 (×7): qty 1

## 2013-11-21 NOTE — ED Provider Notes (Signed)
CSN: [id number]     Arrival date & time 11/21/13  1259 History   First MD Initiated Contact with Patient 11/21/13 1332     Chief Complaint  Patient presents with  . lethargic   . Hypotension  . possible low potassium    (Consider location/radiation/quality/duration/timing/severity/associated sxs/prior Treatment) The history is provided by the patient. The history is limited by the condition of the patient.  Kirk [name] is a 80 y.o. male hx of CAD, ischemic cardiomyopathy, HTN, cirrhosis here with confused. More confused for the last 2 days. He has been more forgetful and sleeping more often. He had his lasix and spironolactone changed on Tuesday by GI doctor. Denies fevers or chills. Denies chest pain or shortness of breath.    Level V caveat- AMS   Past Medical History  Diagnosis Date  . GERD (gastroesophageal reflux disease)   . Diverticulosis     s/p partial colon resection  . CAD (coronary artery disease)     s/p MI 1999;  s/p CABG 2005 (L-LAD, S-Dx, S-OM1, S-PDA); patient had post op AFib;  Myoview 8/10: mild fixed lateral defect; no ischemia, EF 47%  . Hyperlipemia   . Ischemic cardiomyopathy     echo 1/11: mild MR, mild LAE, EF 40-45%;  echo 10/12: EF 40-45%, IL AK, Post AK, grade 2 diast dysfxn, mild to mod MR, mild BAE, PASP 38;   Echo 9/14:  Mild focal basal septal hypertrophy, EF 40-45%, IL, inf and post HK, Gr 1 DD, MAC, mild MR, mild LAE, mild RVE, mild RAE, PASP 32, L pleural effusion  . Hypertension   . Arthritis   . Common bile duct dilatation   . Pancreatitis     post-ERCP 11/2009  . Colitis, ischemic   . Chronic systolic heart failure     Echo 09/05/11: mild focal basal septal hypertrophy, EF 40-45%, basal to mid IL AK, post AK, grade 2 diast dysfxn, mild to mod MR, mild BAE, PASP 38  . Atrial fibrillation     post CABG in 2005 and in setting of acute illness in 1/11 (post ERCP pancreatitis) - coumadin not felt to be indicated  . Lumbar spondylosis   .  Elevated LFTs     statin d/c'd in past due to   . BPH (benign prostatic hyperplasia)   . Sinus bradycardia     on beta blockers  . Ulcer 10/2012    in pylorus  . Allergy   . Anemia   . Diverticulitis     with partial colectomy  . Cirrhosis     Negative workup for viral hepatitis, autoimmune hepatitis, hemochromatosis. Never heavy ETOH. Suspect NASH. EGD (12/13) with esophageal varices and portal hypertensive gastropathy. EGD (3/14) with 3 small esophageal varices, portal hypertensive gastropathy.  . Myocardial infarction 1991; 1999  . Esophageal varices in cirrhosis     Hattie Perch/notes 08/04/2013 (08/05/2013)  . Depression   . Anxiety   . Hyperkalemia requiring hospitalization 08/04/2013  . Cirrhosis    Past Surgical History  Procedure Laterality Date  . Colon resection      "removed 12-15' of colon" (08/05/2013)  . Back surgery    . Ercp  11/13/2009    dilated bile ducts, atypical cytology, repeat attempt normal pancreatogram but unsuccessful biliary cannulation  . Angioplasty  1999  . Shoulder open rotator cuff repair Bilateral     "I've had a total of 3 OR's for this" (08/05/2013)  . Cataract extraction w/ intraocular lens implant Right   .  Esophagogastroduodenoscopy    . Flexible sigmoidoscopy    . Colonoscopy    . Tonsillectomy    . Coronary angioplasty with stent placement      "before the bypass" (08/05/2013)  . Coronary artery bypass graft  2005    "CABG X7" (08/05/2013)x 4  . Lumbar disc surgery     Family History  Problem Relation Age of Onset  . Diabetes Mother   . Heart disease Mother   . Congestive Heart Failure Mother   . Lung cancer Father   . Colon cancer Neg Hx   . Esophageal cancer Neg Hx   . Rectal cancer Neg Hx   . Stomach cancer Neg Hx    History  Substance Use Topics  . Smoking status: Current Every Day Smoker -- 68 years    Types: Cigars  . Smokeless tobacco: Never Used     Comment: 08/05/2013 "probably 3 cigars/day"; form given 10-08-13  . Alcohol Use:  No    Review of Systems  Unable to perform ROS: Mental status change    Allergies  Erythromycin; Penicillins; and Xifaxan  Home Medications   Current Outpatient Rx  Name  Route  Sig  Dispense  Refill  . aspirin EC 81 MG tablet   Oral   Take 1 tablet (81 mg total) by mouth daily.         . Diclofenac Sodium CR 100 MG 24 hr tablet   Oral   Take 100 mg by mouth as needed for pain.         Marland Kitchen escitalopram (LEXAPRO) 10 MG tablet   Oral   Take 10 mg by mouth daily.         . ferrous sulfate 325 (65 FE) MG tablet   Oral   Take 325 mg by mouth 2 (two) times daily with a meal.         . finasteride (PROSCAR) 5 MG tablet   Oral   Take 5 mg by mouth daily.           . furosemide (LASIX) 20 MG tablet   Oral   Take 6 tablets (120 mg total) by mouth daily.   540 tablet   3   . HYDROcodone-acetaminophen (NORCO/VICODIN) 5-325 MG per tablet   Oral   Take 1 tablet by mouth every 6 (six) hours as needed for moderate pain.   100 tablet   0   . hyoscyamine (ANASPAZ) 0.125 MG TBDP      1-2 dissolved under tongue every 4 hours as needed for abdominal pain   60 tablet   5   . lactulose (CHRONULAC) 10 GM/15ML solution   Oral   Take 30 mLs (20 g total) by mouth daily.   960 mL   5   . losartan (COZAAR) 50 MG tablet   Oral   Take 0.5 tablets (25 mg total) by mouth daily.   60 tablet   6   . pantoprazole (PROTONIX) 40 MG tablet      1 by mouth each day 30 minutes before meal   90 tablet   3   . pravastatin (PRAVACHOL) 40 MG tablet   Oral   Take 1 tablet (40 mg total) by mouth every evening.   90 tablet   1   . spironolactone (ALDACTONE) 25 MG tablet   Oral   Take 8 tablets (200 mg total) by mouth daily.   360 tablet   3    BP 117/49  Pulse 71  Temp(Src) 97.9 F (36.6 C) (Oral)  Resp 18  SpO2 99% Physical Exam  Nursing note and vitals reviewed. Constitutional:  Chronically ill, slightly confused   HENT:  Head: Normocephalic.  Mouth/Throat:  Oropharynx is clear and moist.  Eyes: Conjunctivae are normal. Pupils are equal, round, and reactive to light.  Neck: Normal range of motion. Neck supple.  Cardiovascular: Normal rate, regular rhythm and normal heart sounds.   Pulmonary/Chest: Effort normal and breath sounds normal. No respiratory distress. He has no wheezes. He has no rales.  Abdominal: Soft. Bowel sounds are normal. He exhibits no distension. There is no tenderness. There is no rebound.  Minimal swelling, minimal fluid wave   Musculoskeletal: Normal range of motion.  1+ edema bilaterally   Neurological: He is alert.  Tired, slightly confused. ? Asterixis, nl strength throughout    Skin: Skin is warm and dry.  Psychiatric: He has a normal mood and affect. His behavior is normal. Judgment and thought content normal.    ED Course  Procedures (including critical care time) Labs Review Labs Reviewed  CBC WITH DIFFERENTIAL - Abnormal; Notable for the following:    RBC 2.49 (*)    Hemoglobin 8.2 (*)    HCT 24.0 (*)    RDW 16.2 (*)    Platelets 127 (*)    Monocytes Relative 15 (*)    All other components within normal limits  COMPREHENSIVE METABOLIC PANEL - Abnormal; Notable for the following:    Glucose, Bld 115 (*)    BUN 51 (*)    Creatinine, Ser 2.37 (*)    Calcium 8.3 (*)    Albumin 2.3 (*)    Alkaline Phosphatase 170 (*)    GFR calc non Af Amer 24 (*)    GFR calc Af Amer 27 (*)    All other components within normal limits  PRO B NATRIURETIC PEPTIDE - Abnormal; Notable for the following:    Pro B Natriuretic peptide (BNP) 1437.0 (*)    All other components within normal limits  AMMONIA - Abnormal; Notable for the following:    Ammonia 142 (*)    All other components within normal limits  URINALYSIS, ROUTINE W REFLEX MICROSCOPIC  POCT I-STAT TROPONIN I   Imaging Review Dg Chest 2 View  11/21/2013   CLINICAL DATA:  Cough and weakness.  EXAM: CHEST  2 VIEW  COMPARISON:  08/04/2013  FINDINGS: Postop CABG.  Heart size is upper normal. Negative for heart failure. Negative for pneumonia. Lungs are clear.  IMPRESSION: No active cardiopulmonary disease.   Electronically Signed   By: Marlan Palau M.D.   On: 11/21/2013 14:28   Ct Head Wo Contrast  11/21/2013   CLINICAL DATA:  Altered mental status.  EXAM: CT HEAD WITHOUT CONTRAST  TECHNIQUE: Contiguous axial images were obtained from the base of the skull through the vertex without contrast.  COMPARISON:  None  FINDINGS: No evidence for acute infarction, hemorrhage, mass lesion, hydrocephalus, or extra-axial fluid. Moderate cerebral and cerebellar atrophy. Chronic microvascular ischemic change affects the periventricular and subcortical white matter. The calvarium is intact. There is no sinus or mastoid fluid. Prior ocular surgery. A hearing is present in the left external canal.  IMPRESSION: No acute intracranial abnormality.  Chronic changes as described.   Electronically Signed   By: Davonna Belling M.D.   On: 11/21/2013 14:20    EKG Interpretation    Date/Time:  Friday November 21 2013 14:11:11 EST Ventricular Rate:  66 PR Interval:  134 QRS  Duration: 104 QT Interval:  451 QTC Calculation: 473 R Axis:   52 Text Interpretation:  Sinus rhythm Atrial premature complex Nonspecific T abnormalities, lateral leads No significant change since last tracing Confirmed by Atheena Spano  MD, Cheston Coury [phone]) on 11/21/2013 2:14:15 PM            MDM  No diagnosis found. BRANDY KABAT is a 80 y.o. male here with confusion. Will consider infection vs hepatic encephalopathy vs over diuresis. Will get labs, CT, ammonia.   3:38 PM Ammonia level elevated, given lactulose for hepatic encephalopathy. Also acute on chronic renal failure likely from over diuresis. I called Dr. Leone Payor, GI, who will follow. I called Dr. Joseph Art, who will admit for hepatic encephalopathy, acute renal failure.       Richardean Canal, MD 11/21/13 1539

## 2013-11-21 NOTE — Progress Notes (Signed)
Patient vomited suddenly while being repositioned in the bed. Patient stated that he was not nauseous before and that it came on all of the sudden. Patient vomited 90 cc of yellowish brown liquid with what looked like some food particles in it. There was no sign of bright red blood or coffee ground substance. 4 mg IV zofran given to patient. [facility] notified, no new orders received. Will continue to monitor patient.

## 2013-11-21 NOTE — Telephone Encounter (Signed)
Patient was seen on Tuesday and had his diuretics changed up per wife. On Wednesday, patient felt bad and vomited x 1 that night. Yesterday, he was "out of it." Wife reports today he is more confused and sleepy. No pain medication today and only one time yesterday. Per Dr. Leone PayorGessner, [facility]. Patient's wife notified and she will take him.

## 2013-11-21 NOTE — Consult Note (Signed)
Referring Provider: ER MD Primary Care Physician:  Minda Meo, MD Primary Gastroenterologist:  Dr.Gessner  Reason for Consultation: Hepatic Encephalopathy, acute renal failure  HPI: Kirk [name] is a 80 y.o. male and to Dr. 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 He has had most recent EGD in September of 2014 with small varices. He has also had chronic edema and ascites and has been on fairly high-dose diuretics. Other medical problems include history of diverticular disease status post partial colonic resection, coronary artery disease status post MI in 1999 CABG in 2005 and an ischemic cardiomyopathy with EF of 40-45%. History of atrial fibrillation. He was just seen in the office 3 days ago for regular followup and had finally started diuresis with increased doses of diuretics. These were being titrated up carefully and he was tolerating. Labs on 11/17/2013 shows sodium of 138 potassium 3.6 BUN of 23 and creatinine of 1.2. He had no evidence of encephalopathy at that time. He is unable to tolerate Xifaxan secondary to rash. His diuretics were continued at the same dosages but consolidated once daily. Most recently he has been taking 200 mg of Aldactone once daily and 120 mg of Lasix once daily. Apparently over the past 48 hours he had developed nausea and vomiting once on the evening of 01/14 and apparently again yesterday morning followed by lethargy and increased weakness yesterday. His wife states that he was even more out of it and a bit confused this morning. She has not seen any vomiting today, he has not had any diarrhea and in fact has only had one bowel movement this week. They have not been aware of any fever or chills and he has no complaints of pain.  Labs are as outlined above. Venous ammonia is elevated at 124 and creatinine is 2.37. BNP is also elevated- the chest x-ray does not show  evidence of acute pulmonary edema, UA is negative.    Past Medical History  Diagnosis Date  . GERD (gastroesophageal reflux disease)   . Diverticulosis     s/p partial colon resection  . CAD (coronary artery disease)     s/p MI 1999;  s/p CABG 2005 (L-LAD, S-Dx, S-OM1, S-PDA); patient had post op AFib;  Myoview 8/10: mild fixed lateral defect; no ischemia, EF 47%  . Hyperlipemia   . Ischemic cardiomyopathy     echo 1/11: mild MR, mild LAE, EF 40-45%;  echo 10/12: EF 40-45%, IL AK, Post AK, grade 2 diast dysfxn, mild to mod MR, mild BAE, PASP 38;   Echo 9/14:  Mild focal basal septal hypertrophy, EF 40-45%, IL, inf and post HK, Gr 1 DD, MAC, mild MR, mild LAE, mild RVE, mild RAE, PASP 32, L pleural effusion  . Hypertension   . Arthritis   . Common bile duct dilatation   . Pancreatitis     post-ERCP 11/2009  . Colitis, ischemic   . Chronic systolic heart failure     Echo 09/05/11: mild focal basal septal hypertrophy, EF 40-45%, basal to mid IL AK, post AK, grade 2 diast dysfxn, mild to mod MR, mild BAE, PASP 38  . Atrial fibrillation     post CABG in 2005 and in setting of acute illness in 1/11 (post ERCP pancreatitis) - coumadin not felt to be indicated  . Lumbar spondylosis   . Elevated LFTs     statin d/c'd in past due to   . BPH (benign prostatic hyperplasia)   .  Sinus bradycardia     on beta blockers  . Ulcer 10/2012    in pylorus  . Allergy   . Anemia   . Diverticulitis     with partial colectomy  . Cirrhosis     Negative workup for viral hepatitis, autoimmune hepatitis, hemochromatosis. Never heavy ETOH. Suspect NASH. EGD (12/13) with esophageal varices and portal hypertensive gastropathy. EGD (3/14) with 3 small esophageal varices, portal hypertensive gastropathy.  . Myocardial infarction 1991; 1999  . Esophageal varices in cirrhosis     Hattie Perch/notes 08/04/2013 (08/05/2013)  . Depression   . Anxiety   . Hyperkalemia requiring hospitalization 08/04/2013  . Cirrhosis     Past  Surgical History  Procedure Laterality Date  . Colon resection      "removed 12-15' of colon" (08/05/2013)  . Back surgery    . Ercp  11/13/2009    dilated bile ducts, atypical cytology, repeat attempt normal pancreatogram but unsuccessful biliary cannulation  . Angioplasty  1999  . Shoulder open rotator cuff repair Bilateral     "I've had a total of 3 OR's for this" (08/05/2013)  . Cataract extraction w/ intraocular lens implant Right   . Esophagogastroduodenoscopy    . Flexible sigmoidoscopy    . Colonoscopy    . Tonsillectomy    . Coronary angioplasty with stent placement      "before the bypass" (08/05/2013)  . Coronary artery bypass graft  2005    "CABG X7" (08/05/2013)x 4  . Lumbar disc surgery      Prior to Admission medications   Medication Sig Start Date End Date Taking? Authorizing Provider  aspirin EC 81 MG tablet Take 1 tablet (81 mg total) by mouth daily. 10/25/12   Laurey Moralealton S McLean, MD  Diclofenac Sodium CR 100 MG 24 hr tablet Take 100 mg by mouth as needed for pain.    Historical Provider, MD  escitalopram (LEXAPRO) 10 MG tablet Take 10 mg by mouth daily.    Historical Provider, MD  finasteride (PROSCAR) 5 MG tablet Take 5 mg by mouth daily.      Historical Provider, MD  furosemide (LASIX) 20 MG tablet Take 6 tablets (120 mg total) by mouth daily. 11/18/13   Iva Booparl E Gessner, MD  HYDROcodone-acetaminophen (NORCO/VICODIN) 5-325 MG per tablet Take 1 tablet by mouth every 6 (six) hours as needed for moderate pain. 11/18/13   Iva Booparl E Gessner, MD  hyoscyamine (ANASPAZ) 0.125 MG TBDP 1-2 dissolved under tongue every 4 hours as needed for abdominal pain 05/01/13   Iva Booparl E Gessner, MD  lactulose (CHRONULAC) 10 GM/15ML solution Take 30 mLs (20 g total) by mouth daily. 11/04/13   Iva Booparl E Gessner, MD  losartan (COZAAR) 50 MG tablet Take 0.5 tablets (25 mg total) by mouth daily. 09/10/13   Iva Booparl E Gessner, MD  pantoprazole (PROTONIX) 40 MG tablet 1 by mouth each day 30 minutes before meal 12/06/12    Iva Booparl E Gessner, MD  pravastatin (PRAVACHOL) 40 MG tablet Take 1 tablet (40 mg total) by mouth every evening. 12/26/12   Laurey Moralealton S McLean, MD  spironolactone (ALDACTONE) 25 MG tablet Take 8 tablets (200 mg total) by mouth daily. 11/18/13   Iva Booparl E Gessner, MD    Current Facility-Administered Medications  Medication Dose Route Frequency Provider Last Rate Last Dose  . lactulose (CHRONULAC) 10 GM/15ML solution 10 g  10 g Oral Once Richardean Canalavid H Yao, MD       Current Outpatient Prescriptions  Medication Sig Dispense Refill  . aspirin  EC 81 MG tablet Take 1 tablet (81 mg total) by mouth daily.      . Diclofenac Sodium CR 100 MG 24 hr tablet Take 100 mg by mouth as needed for pain.      Marland Kitchen escitalopram (LEXAPRO) 10 MG tablet Take 10 mg by mouth daily.      . finasteride (PROSCAR) 5 MG tablet Take 5 mg by mouth daily.        . furosemide (LASIX) 20 MG tablet Take 6 tablets (120 mg total) by mouth daily.  540 tablet  3  . HYDROcodone-acetaminophen (NORCO/VICODIN) 5-325 MG per tablet Take 1 tablet by mouth every 6 (six) hours as needed for moderate pain.  100 tablet  0  . hyoscyamine (ANASPAZ) 0.125 MG TBDP 1-2 dissolved under tongue every 4 hours as needed for abdominal pain  60 tablet  5  . lactulose (CHRONULAC) 10 GM/15ML solution Take 30 mLs (20 g total) by mouth daily.  960 mL  5  . losartan (COZAAR) 50 MG tablet Take 0.5 tablets (25 mg total) by mouth daily.  60 tablet  6  . pantoprazole (PROTONIX) 40 MG tablet 1 by mouth each day 30 minutes before meal  90 tablet  3  . pravastatin (PRAVACHOL) 40 MG tablet Take 1 tablet (40 mg total) by mouth every evening.  90 tablet  1  . spironolactone (ALDACTONE) 25 MG tablet Take 8 tablets (200 mg total) by mouth daily.  360 tablet  3    Allergies as of 11/21/2013 - Review Complete 11/21/2013  Allergen Reaction Noted  . Erythromycin Itching and Rash 03/16/2011  . Penicillins Rash 03/16/2011  . Xifaxan [rifaximin] Rash 11/04/2013    Family History  Problem  Relation Age of Onset  . Diabetes Mother   . Heart disease Mother   . Congestive Heart Failure Mother   . Lung cancer Father   . Colon cancer Neg Hx   . Esophageal cancer Neg Hx   . Rectal cancer Neg Hx   . Stomach cancer Neg Hx     History   Social History  . Marital Status: Married    Spouse Name: N/A    Number of Children: N/A  . Years of Education: N/A   Occupational History  . retired    Social History Main Topics  . Smoking status: Current Every Day Smoker -- 68 years    Types: Cigars  . Smokeless tobacco: Never Used     Comment: 08/05/2013 "probably 3 cigars/day"; form given 10-08-13  . Alcohol Use: No  . Drug Use: No  . Sexual Activity: No   Other Topics Concern  . Not on file   Social History Narrative   Retired from [employer]   Married    Review of Systems: Pertinent positive and negative review of systems were noted in the above HPI section.  All other review of systems was otherwise negative. Physical Exam: Vital signs in last 24 hours: Temp:  [97.9 F (36.6 C)] 97.9 F (36.6 C) (01/16 1327) Pulse Rate:  [71] 71 (01/16 1327) Resp:  [18] 18 (01/16 1327) BP: (117)/(49) 117/49 mmHg (01/16 1327) SpO2:  [99 %] 99 % (01/16 1327)   General:   Alert, well-developed, thin elderly WM, pleasant and cooperative in NAD, mentating slowly but appropriate Head:  Normocephalic and atraumatic. Eyes:  Sclera clear, no icterus.   Conjunctiva pink. Ears:  Normal auditory acuity. Nose:  No deformity, discharge,  or lesions. Mouth:  No deformity or lesions.  Neck:  Supple; no masses or thyromegaly. Lungs:  Clear throughout to auscultation.   No wheezes, crackles, or rhonchi. Heart:  Regular rate and rhythm; no murmurs, clicks, rubs,  or gallops. Sternal incisional scar  Abdomen:  Soft,nontender, BS active, no palp mass or hsm. Positive ascites, he does have a right inguinal hernia which is reducible and nontender   Rectal:  Hard formed stool in the rectal  vault brown  Msk:  Symmetrical without gross deformities. . Pulses:  Normal pulses noted. Extremities:  Without clubbing or edema. Neurologic:  Alert and  orientedx 3 grossly normal neurologically. Slight asterixis Skin:  Intact without significant lesions or rashes.. Psych:  Alert and cooperative. Normal mood and affect.  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 Patient has been undergoing gradual titration of diuretics with good response and now presents with 24-48 hours of lethargy, mild confusion and 2 episodes of vomiting. He has not had any medication changes this week. Encephalopathy may be precipitated by acute renal insufficiency No evidence of obvious infection by labs chest x-ray etc. He has been constipated. Patient intolerant to Xifaxan and has been on 30 cc of Chronulac daily at home but not having frequent BMs #2 acute renal failure-may be multifactorial with recent vomiting but suspect secondary to high-dose diuretics #3 ischemic cardiomyopathy with EF of 40% #4coronary artery disease status post prior CABG #5 history of atrial fibrillation #6 chronic anemia-about at his baseline currently   PLAN: #1 will give fleets enemas for obstipation, and start  Chronulac 30 cc 4 times daily in short-term #2 hold diuretics #3 very gentle hydration #4 anti-emetics as needed #5 serial labs Will follow with you-discussed with patient and his wife.   [name]  11/21/2013, 3:29 PM     Attending physician's note   I have taken a history, examined the patient and reviewed the chart. I agree with the Advanced Practitioner's note, impression and recommendations. Suspected intravascular volume depletion/dehydration from vomiting, decreased PO intake and diuretics. Encephalopathy likely from dehydration and infrequent BMs on lactulose. If his status does not improved rapidly with hydration and relief of constipation he will need a diagnostic paracentesis.   Meryl Dare, MD Clementeen Graham

## 2013-11-21 NOTE — ED Notes (Signed)
Per pt/family-went to PCP and they increased his lasix-wife states he has been lethargic and "not with it"

## 2013-11-21 NOTE — H&P (Signed)
[facility] History and Physical  Kirk SloughJames B [name] ZOX:[id number] DOB: 02-26-1927 DOA: 11/21/2013  Referring physician:  PCP: Kirk MeoARONSON,RICHARD A, MD  Specialists:   Chief Complaint: Hepatic encephalopathy  HPI: Kirk [name] is [name] 80 y.o. male WM PMHx HTN, atrial fibrillation, ischemic cardiomyopathy, chronic systolic heart failure, S/P CABG approximately 2001, Nash-induced cirrhosis, decompensated with history of esophageal varices and portal hypertensive gastropathy, diverticular disease S/P partial colonic resection, chronic epigastric pain. Presented to ED with increasing confusion x2 days. Increased forgetfulness and increased fatigue/sleeping. Lasix and spiral-like tone were increased on Tuesday by GI physician. Dr. Claudette HeadMalcolm Stark ([facility]) will see patient. Patient's wife states that this morning while she was making banana pancake patient became confused, disorganized speech, and after they were on their way to the emergency department, he was not making sense. Patient does not recall this part of the day. Patient states he does recall being in the emergency department in speaking with medical staff, however wife states patient was still mildly confused in the emergency department (stated he was 6 feet 5 inches tall). Patient states that he's had 2 episodes of emesis since visiting gastroenterologist on Tuesday. Negative diarrhea, negative fever, currently negative nausea. Patient and wife states unsure of patient's base weight.    Review of Systems: The patient denies anorexia, fever, weight loss,, vision loss, hoarseness, chest pain, syncope, dyspnea on exertion, peripheral edema, balance deficits, hemoptysis, abdominal pain, melena, hematochezia, severe indigestion/heartburn, hematuria, incontinence, genital sores, suspicious skin lesions, transient blindness, difficulty walking, depression, unusual weight change, abnormal bleeding, enlarged lymph nodes, angioedema, and breast masses.     TRAVEL HISTORY: None   Procedure Echocardiogram 07/14/2013 - Left ventricle: mild focal basal hypertrophy of the septum. Systolic function was mildly to moderately reduced.  -LVEF= 40% to 45%. -Severe hypokinesis of the entire inferolateral, inferior and basal posterior myocardium.  -(grade 1 diastolic dysfunction). - Mitral valve: Mildly calcified annulus. Mild regurgitation. - Left atrium: The atrium was mildly dilated. - Right ventricle: The cavity size was mildly dilated. - Right atrium: The atrium was mildly dilated. - Pulmonary arteries: PA peak pressure: 32mm Hg (S). - Pericardium, extracardiac: There was [name] left pleural effusion.   Consults Dr. Claudette HeadMalcolm Stark ([facility])   Antibiotics    Past Medical History  Diagnosis Date  . GERD (gastroesophageal reflux disease)   . Diverticulosis     s/p partial colon resection  . CAD (coronary artery disease)     s/p MI 1999;  s/p CABG 2005 (L-LAD, S-Dx, S-OM1, S-PDA); patient had post op AFib;  Myoview 8/10: mild fixed lateral defect; no ischemia, EF 47%  . Hyperlipemia   . Ischemic cardiomyopathy     echo 1/11: mild MR, mild LAE, EF 40-45%;  echo 10/12: EF 40-45%, IL AK, Post AK, grade 2 diast dysfxn, mild to mod MR, mild BAE, PASP 38;   Echo 9/14:  Mild focal basal septal hypertrophy, EF 40-45%, IL, inf and post HK, Gr 1 DD, MAC, mild MR, mild LAE, mild RVE, mild RAE, PASP 32, L pleural effusion  . Hypertension   . Arthritis   . Common bile duct dilatation   . Pancreatitis     post-ERCP 11/2009  . Colitis, ischemic   . Chronic systolic heart failure     Echo 09/05/11: mild focal basal septal hypertrophy, EF 40-45%, basal to mid IL AK, post AK, grade 2 diast dysfxn, mild to mod MR, mild BAE, PASP 38  . Atrial fibrillation     post CABG  in 2005 and in setting of acute illness in 1/11 (post ERCP pancreatitis) - coumadin not felt to be indicated  . Lumbar spondylosis   . Elevated LFTs     statin d/c'd in past due to   .  BPH (benign prostatic hyperplasia)   . Sinus bradycardia     on beta blockers  . Ulcer 10/2012    in pylorus  . Allergy   . Anemia   . Diverticulitis     with partial colectomy  . Cirrhosis     Negative workup for viral hepatitis, autoimmune hepatitis, hemochromatosis. Never heavy ETOH. Suspect NASH. EGD (12/13) with esophageal varices and portal hypertensive gastropathy. EGD (3/14) with 3 small esophageal varices, portal hypertensive gastropathy.  . Myocardial infarction 1991; 1999  . Esophageal varices in cirrhosis     Hattie Perch 08/04/2013 (08/05/2013)  . Depression   . Anxiety   . Hyperkalemia requiring hospitalization 08/04/2013  . Cirrhosis    Past Surgical History  Procedure Laterality Date  . Colon resection      "removed 12-15' of colon" (08/05/2013)  . Back surgery    . Ercp  11/13/2009    dilated bile ducts, atypical cytology, repeat attempt normal pancreatogram but unsuccessful biliary cannulation  . Angioplasty  1999  . Shoulder open rotator cuff repair Bilateral     "I've had [name] total of 3 OR's for this" (08/05/2013)  . Cataract extraction w/ intraocular lens implant Right   . Esophagogastroduodenoscopy    . Flexible sigmoidoscopy    . Colonoscopy    . Tonsillectomy    . Coronary angioplasty with stent placement      "before the bypass" (08/05/2013)  . Coronary artery bypass graft  2005    "CABG X7" (08/05/2013)x 4  . Lumbar disc surgery     Social History:  reports that he has been smoking Cigars.  He has never used smokeless tobacco. He reports that he does not drink alcohol or use illicit drugs. where does patient live--home, ALF, SNF? Home with wife  Can patient participate in ADLs? Yes  Allergies  Allergen Reactions  . Erythromycin Itching and Rash    Patient allergic to all Mycins   . Penicillins Rash  . Xifaxan [Rifaximin] Rash    Family History  Problem Relation Age of Onset  . Diabetes Mother   . Heart disease Mother   . Congestive Heart Failure  Mother   . Lung cancer Father   . Colon cancer Neg Hx   . Esophageal cancer Neg Hx   . Rectal cancer Neg Hx   . Stomach cancer Neg Hx       Prior to Admission medications   Medication Sig Start Date End Date Taking? Authorizing Provider  aspirin EC 81 MG tablet Take 1 tablet (81 mg total) by mouth daily. 10/25/12  Yes Laurey Morale, MD  Diclofenac Sodium CR 100 MG 24 hr tablet Take 100 mg by mouth as needed for pain.   Yes Historical Provider, MD  escitalopram (LEXAPRO) 10 MG tablet Take 10 mg by mouth daily.   Yes Historical Provider, MD  ferrous sulfate 325 (65 FE) MG tablet Take 325 mg by mouth 2 (two) times daily with [name] meal.   Yes Historical Provider, MD  finasteride (PROSCAR) 5 MG tablet Take 5 mg by mouth daily.     Yes Historical Provider, MD  furosemide (LASIX) 20 MG tablet Take 6 tablets (120 mg total) by mouth daily. 11/18/13  Yes Iva Boop, MD  HYDROcodone-acetaminophen (NORCO/VICODIN) 5-325 MG per tablet Take 1 tablet by mouth every 6 (six) hours as needed for moderate pain. 11/18/13  Yes Iva Boop, MD  hyoscyamine (ANASPAZ) 0.125 MG TBDP 1-2 dissolved under tongue every 4 hours as needed for abdominal pain 05/01/13  Yes Iva Boop, MD  lactulose (CHRONULAC) 10 GM/15ML solution Take 30 mLs (20 g total) by mouth daily. 11/04/13  Yes Iva Boop, MD  losartan (COZAAR) 50 MG tablet Take 0.5 tablets (25 mg total) by mouth daily. 09/10/13  Yes Iva Boop, MD  pantoprazole (PROTONIX) 40 MG tablet 1 by mouth each day 30 minutes before meal 12/06/12  Yes Iva Boop, MD  pravastatin (PRAVACHOL) 40 MG tablet Take 1 tablet (40 mg total) by mouth every evening. 12/26/12  Yes Laurey Morale, MD  spironolactone (ALDACTONE) 25 MG tablet Take 8 tablets (200 mg total) by mouth daily. 11/18/13  Yes Iva Boop, MD   Physical Exam: Filed Vitals:   11/21/13 1545 11/21/13 1559 11/21/13 1625 11/21/13 2111  BP: 128/64  127/68 130/63  Pulse: 70 72 78 75  Temp:  97.9 F  (36.6 C) 97.8 F (36.6 C) 98 F (36.7 C)  TempSrc:  Oral Oral Oral  Resp: 20 22 20 20   Height:   5\' 6"  (1.676 m)   Weight:   55.157 kg (121 lb 9.6 oz)   SpO2:  98% 100% 100%     General:  [name]./O. x4, NAD  Eyes: Pupils equal reactive to light and accommodation  ENT: Patient part of hearing does wear hearing aids  Neck: Negative JVD, negative lymphadenopathy  Cardiovascular: Regular rhythm and rate, negative murmurs rubs gallops, DP/PT pulse +1  Respiratory: Clear to auscultation bilateral  Abdomen: Soft, nontender, nondistended, plus bowel sound  Skin: Negative lesions/rash, negative jaundice  Musculoskeletal: Mild +1 pitting edema bilateral lower extremity  Neurologic: Cranial nerves II through XII intact, tongue/uvula midline, muscle strength in extremities 5/5, sensation intact throughout mild asterixis bilateral  Labs on Admission:  Basic Metabolic Panel:  Recent Labs Lab 11/17/13 0920 11/21/13 1425  NA 138 140  K 3.6 5.2  CL 104 103  CO2 30 25  GLUCOSE 129* 115*  BUN 23 51*  CREATININE 1.2 2.37*  CALCIUM 8.3* 8.3*   Liver Function Tests:  Recent Labs Lab 11/21/13 1425  AST 25  ALT 13  ALKPHOS 170*  BILITOT 0.6  PROT 7.1  ALBUMIN 2.3*   No results found for this basename: LIPASE, AMYLASE,  in the last 168 hours  Recent Labs Lab 11/21/13 1425  AMMONIA 142*   CBC:  Recent Labs Lab 11/18/13 1705 11/21/13 1425  WBC 6.8 5.1  NEUTROABS 4.6 3.0  HGB 8.3 Repeated and verified X2.* 8.2*  HCT 24.8 Repeated and verified X2.* 24.0*  MCV 99.0 96.4  PLT 138.0* 127*   Cardiac Enzymes:  Recent Labs Lab 11/21/13 1738  TROPONINI <0.30    BNP (last 3 results)  Recent Labs  05/12/13 0823 06/30/13 0946 11/21/13 1425  PROBNP 342.0* 364.0* 1437.0*   CBG:  Recent Labs Lab 11/21/13 2100  GLUCAP 131*    Radiological Exams on Admission: Dg Chest 2 View  11/21/2013   CLINICAL DATA:  Cough and weakness.  EXAM: CHEST  2 VIEW  COMPARISON:   08/04/2013  FINDINGS: Postop CABG. Heart size is upper normal. Negative for heart failure. Negative for pneumonia. Lungs are clear.  IMPRESSION: No active cardiopulmonary disease.   Electronically Signed   By: Leonette Most  Chestine Spore M.D.   On: 11/21/2013 14:28   Ct Head Wo Contrast  11/21/2013   CLINICAL DATA:  Altered mental status.  EXAM: CT HEAD WITHOUT CONTRAST  TECHNIQUE: Contiguous axial images were obtained from the base of the skull through the vertex without contrast.  COMPARISON:  None  FINDINGS: No evidence for acute infarction, hemorrhage, mass lesion, hydrocephalus, or extra-axial fluid. Moderate cerebral and cerebellar atrophy. Chronic microvascular ischemic change affects the periventricular and subcortical white matter. The calvarium is intact. There is no sinus or mastoid fluid. Prior ocular surgery. [name] hearing is present in the left external canal.  IMPRESSION: No acute intracranial abnormality.  Chronic changes as described.   Electronically Signed   By: Davonna Belling M.D.   On: 11/21/2013 14:20    EKG:   Assessment/Plan Active Problems:   Chronic epigastric pain   Hyperlipidemia   Atrial fibrillation   Ischemic cardiomyopathy   HTN (hypertension)   Tobacco abuse   Esophageal varices in cirrhosis, mild pportal gastropathy   Duodenal stricture   Cirrhosis of liver without mention of alcohol   Hepatic encephalopathy   Pulmonary hypertension   Chronic combined systolic and diastolic CHF (congestive heart failure)   Increased ammonia level   Acute renal insufficiency   Encephalopathy acute    Hepatic encephalopathy -Patient's ammonia= 142 -Lactulose 30 cc QID, monitor ammonia level closely  Esophageal varices compared to cirrhosis -No reported symptoms of [name] matter emesis will monitor closely  Cirrhosis of liver without mention of alcohol -Will monitor liver enzymes closely -Hold all diuretics -Hold all medication that is hepatic toxic  Acute renal failure -Most likely  secondary to dehydration hold all diuretics -Hydrate gently secondary to patient's unknown cardiac function -Monitor closely for fluid overload  Ischemic cardiomyopathy/combined systolic and diastolic CHF -Obtain troponin x3 -EKG pending -Echocardiogram  Atrial fibrillation -Currently in NSR -Rate controlled on no cardiac medication  HTN -Within AHA guidelines  Anemia -Monitor closely currently stable      Code Status: Full Family Communication: Wife present for discussion of plan of care Disposition Plan: Resolution of encephalopathy/electrolyte abnormality  Time spent: 90 min  Nita Whitmire, J [facility] Pager [phone]  If 7PM-7AM, please contact night-coverage [email or web address] Password [facility] 11/21/2013, 9:24 PM

## 2013-11-21 NOTE — ED Notes (Signed)
Report called to floor

## 2013-11-22 ENCOUNTER — Inpatient Hospital Stay (HOSPITAL_COMMUNITY): Payer: Medicare Other

## 2013-11-22 DIAGNOSIS — I2510 Atherosclerotic heart disease of native coronary artery without angina pectoris: Secondary | ICD-10-CM

## 2013-11-22 LAB — CBC WITH DIFFERENTIAL/PLATELET
BASOS ABS: 0 10*3/uL (ref 0.0–0.1)
BASOS PCT: 1 % (ref 0–1)
EOS PCT: 1 % (ref 0–5)
Eosinophils Absolute: 0.1 10*3/uL (ref 0.0–0.7)
HCT: 23.7 % — ABNORMAL LOW (ref 39.0–52.0)
Hemoglobin: 8 g/dL — ABNORMAL LOW (ref 13.0–17.0)
Lymphocytes Relative: 28 % (ref 12–46)
Lymphs Abs: 1.2 10*3/uL (ref 0.7–4.0)
MCH: 32.8 pg (ref 26.0–34.0)
MCHC: 33.8 g/dL (ref 30.0–36.0)
MCV: 97.1 fL (ref 78.0–100.0)
Monocytes Absolute: 0.6 10*3/uL (ref 0.1–1.0)
Monocytes Relative: 13 % — ABNORMAL HIGH (ref 3–12)
NEUTROS ABS: 2.5 10*3/uL (ref 1.7–7.7)
Neutrophils Relative %: 57 % (ref 43–77)
PLATELETS: 120 10*3/uL — AB (ref 150–400)
RBC: 2.44 MIL/uL — ABNORMAL LOW (ref 4.22–5.81)
RDW: 16.4 % — AB (ref 11.5–15.5)
WBC: 4.4 10*3/uL (ref 4.0–10.5)

## 2013-11-22 LAB — LIPID PANEL
CHOL/HDL RATIO: 3.5 ratio
Cholesterol: 104 mg/dL (ref 0–200)
HDL: 30 mg/dL — ABNORMAL LOW (ref >39)
LDL Cholesterol: 61 mg/dL (ref 0–99)
Triglycerides: 65 mg/dL (ref <150)
VLDL: 13 mg/dL (ref 0–40)

## 2013-11-22 LAB — COMPREHENSIVE METABOLIC PANEL
ALBUMIN: 2.2 g/dL — AB (ref 3.5–5.2)
ALK PHOS: 142 U/L — AB (ref 39–117)
ALT: 13 U/L (ref 0–53)
AST: 21 U/L (ref 0–37)
BILIRUBIN TOTAL: 0.8 mg/dL (ref 0.3–1.2)
BUN: 50 mg/dL — AB (ref 6–23)
CHLORIDE: 106 meq/L (ref 96–112)
CO2: 23 meq/L (ref 19–32)
Calcium: 8.2 mg/dL — ABNORMAL LOW (ref 8.4–10.5)
Creatinine, Ser: 2.08 mg/dL — ABNORMAL HIGH (ref 0.50–1.35)
GFR, EST AFRICAN AMERICAN: 32 mL/min — AB (ref >90)
GFR, EST NON AFRICAN AMERICAN: 28 mL/min — AB (ref >90)
GLUCOSE: 110 mg/dL — AB (ref 70–99)
POTASSIUM: 4.8 meq/L (ref 3.7–5.3)
Sodium: 140 mEq/L (ref 137–147)
Total Protein: 6.8 g/dL (ref 6.0–8.3)

## 2013-11-22 LAB — BODY FLUID CELL COUNT WITH DIFFERENTIAL
Eos, Fluid: 0 %
Lymphs, Fluid: 49 %
Monocyte-Macrophage-Serous Fluid: 48 % — ABNORMAL LOW (ref 50–90)
Neutrophil Count, Fluid: 3 % (ref 0–25)
Total Nucleated Cell Count, Fluid: 163 cu mm (ref 0–1000)

## 2013-11-22 LAB — PROTIME-INR
INR: 1.3 (ref 0.00–1.49)
Prothrombin Time: 15.9 seconds — ABNORMAL HIGH (ref 11.6–15.2)

## 2013-11-22 LAB — AMMONIA: AMMONIA: 92 umol/L — AB (ref 11–60)

## 2013-11-22 LAB — TROPONIN I: Troponin I: <0.3 ng/mL (ref <0.30)

## 2013-11-22 LAB — MAGNESIUM: MAGNESIUM: 2.3 mg/dL (ref 1.5–2.5)

## 2013-11-22 MED ORDER — LACTULOSE 10 GM/15ML PO SOLN
30.0000 g | Freq: Four times a day (QID) | ORAL | Status: DC
  Administered 2013-11-22 – 2013-11-23 (×7): 30 g via ORAL
  Filled 2013-11-22 (×8): qty 45

## 2013-11-22 NOTE — Progress Notes (Signed)
Consent for paracentesis obtained by phone from pt's wife.  Verified with 2nd RN.  Ardyth GalAnderson, Susane Bey Ann, RN 11/22/2013

## 2013-11-22 NOTE — Progress Notes (Signed)
  Echocardiogram 2D Echocardiogram has been performed.  Nestor RampRoberts, Somnang Mahan M 11/22/2013, 10:17 AM

## 2013-11-22 NOTE — Procedures (Signed)
US guided diagnostic paracentesis performed yielding 265 cc's clear, light yellow fluid. The fluid was sent to the lab for preordered studies. No immediate complications.

## 2013-11-22 NOTE — Progress Notes (Signed)
[facility] PROGRESS NOTE  [name]:[id number] DOB: 05/25/1927 DOA: 11/21/2013 PCP: Minda Meo, MD  Assessment/Plan: Hepatic encephalopathy  -Admission ammonia= 142 ; current ammonia= 92 -Continue Lactulose 30 cc QID, monitor ammonia level closely   Esophageal varices compared to cirrhosis  -No reported symptoms of a matter emesis will monitor closely   Cirrhosis of liver without mention of alcohol  -Will monitor liver enzymes closely (within normal)  -Continue to Hold all diuretics  -Continue to Hold all medication that is hepatic toxic   Acute renal failure  -Most likely secondary to dehydration hold all diuretics; improving  -Hydrate gently secondary to patient's unknown cardiac function  -Monitor closely for fluid overload   Ischemic cardiomyopathy/combined systolic and diastolic CHF  -Obtain troponin x3, negative  -EKG 1/16 normal sinus rhythm -Echocardiogram; pending   Atrial fibrillation  -Currently in NSR  -Rate controlled on no cardiac medication   HTN  -Within AHA guidelines   Anemia  -Monitor closely currently stable   SBP? -Dr. Claudette Head ([facility]) requested paracentesis to rule out SBP -11/22/2048 US guided diagnostic paracentesis performed yielding 265 cc's clear, light yellow fluid. The fluid was sent to the lab for preordered studies. No immediate complications.    Code Status: Full  Family Communication: Wife present for discussion of plan of care  Disposition Plan: Resolution of encephalopathy/electrolyte abnormality     Procedure   11/22/2048 US guided diagnostic paracentesis performed yielding 265 cc's clear, light yellow fluid. The fluid was sent to the lab for preordered studies. No immediate complications.  Echocardiogram 07/14/2013  - Left ventricle: mild focal basal hypertrophy of the septum. Systolic function was mildly to moderately reduced.  -LVEF= 40% to 45%.  -Severe hypokinesis of the entire  inferolateral, inferior and basal posterior myocardium.  -(grade 1 diastolic dysfunction). - Mitral valve: Mildly calcified annulus. Mild regurgitation. - Left atrium: The atrium was mildly dilated. - Right ventricle: The cavity size was mildly dilated. - Right atrium: The atrium was mildly dilated. - Pulmonary arteries: PA peak pressure: 32mm Hg (S). - Pericardium, extracardiac: There was a left pleural effusion.    Consults  Dr. Claudette Head ([facility])    Antibiotics     HPI/Subjective: Kirk [name] is a 80 y.o. male WM PMHx HTN, atrial fibrillation, ischemic cardiomyopathy, chronic systolic heart failure, S/P CABG approximately 2001, Nash-induced cirrhosis, decompensated with history of esophageal varices and portal hypertensive gastropathy, diverticular disease S/P partial colonic resection, chronic epigastric pain. Presented to ED with increasing confusion x2 days. Increased forgetfulness and increased fatigue/sleeping. Lasix and spiral-like tone were increased on Tuesday by GI physician. Dr. Claudette Head ([facility]) will see patient. Patient's wife states that this morning while she was making banana pancake patient became confused, disorganized speech, and after they were on their way to the emergency department, he was not making sense. Patient does not recall this part of the day. Patient states he does recall being in the emergency department in speaking with medical staff, however wife states patient was still mildly confused in the emergency department (stated he was 6 feet 5 inches tall). Patient states that he's had 2 episodes of emesis since visiting gastroenterologist on Tuesday. Negative diarrhea, negative fever, currently negative nausea. Patient and wife states unsure of patient's base weight. 11/22/2013 patient sleepy, but arousable. More confused this a.m.     Objective: Filed Vitals:   11/22/13 1042 11/22/13 1053 11/22/13 1100 11/22/13 1106  BP: 131/67 135/66  138/69 137/64  Pulse:  Temp:      TempSrc:      Resp:      Height:      Weight:      SpO2:        Intake/Output Summary (Last 24 hours) at 11/22/13 1212 Last data filed at 11/22/13 1155  Gross per 24 hour  Intake 1257.5 ml  Output   1366 ml  Net -108.5 ml   Filed Weights   11/21/13 1625 11/22/13 0432  Weight: 55.157 kg (121 lb 9.6 oz) 55 kg (121 lb 4.1 oz)    Exam: General: A./O. x2 (unsure why, when), NAD  Cardiovascular: Regular rhythm and rate, negative murmurs rubs gallops, DP/PT pulse +1  Respiratory: Clear to auscultation bilateral  Abdomen: Soft, nontender, nondistended, plus bowel sound  Skin: Negative lesions/rash, negative jaundice  Musculoskeletal: Mild +1 pitting edema bilateral lower extremity  Data Reviewed: Basic Metabolic Panel:  Recent Labs Lab 11/17/13 0920 11/21/13 1425 11/22/13 0519  NA 138 140 140  K 3.6 5.2 4.8  CL 104 103 106  CO2 30 25 23   GLUCOSE 129* 115* 110*  BUN 23 51* 50*  CREATININE 1.2 2.37* 2.08*  CALCIUM 8.3* 8.3* 8.2*  MG  --   --  2.3   Liver Function Tests:  Recent Labs Lab 11/21/13 1425 11/22/13 0519  AST 25 21  ALT 13 13  ALKPHOS 170* 142*  BILITOT 0.6 0.8  PROT 7.1 6.8  ALBUMIN 2.3* 2.2*   No results found for this basename: LIPASE, AMYLASE,  in the last 168 hours  Recent Labs Lab 11/21/13 1425 11/22/13 0916  AMMONIA 142* 92*   CBC:  Recent Labs Lab 11/18/13 1705 11/21/13 1425 11/22/13 0519  WBC 6.8 5.1 4.4  NEUTROABS 4.6 3.0 2.5  HGB 8.3 Repeated and verified X2.* 8.2* 8.0*  HCT 24.8 Repeated and verified X2.* 24.0* 23.7*  MCV 99.0 96.4 97.1  PLT 138.0* 127* 120*   Cardiac Enzymes:  Recent Labs Lab 11/21/13 1738 11/22/13 0020 11/22/13 0519  TROPONINI <0.30 <0.30 <0.30   BNP (last 3 results)  Recent Labs  05/12/13 0823 06/30/13 0946 11/21/13 1425  PROBNP 342.0* 364.0* 1437.0*   CBG:  Recent Labs Lab 11/21/13 2100  GLUCAP 131*    No results found for this or any  previous visit (from the past 240 hour(s)).   Studies: Dg Chest 2 View  11/21/2013   CLINICAL DATA:  Cough and weakness.  EXAM: CHEST  2 VIEW  COMPARISON:  08/04/2013  FINDINGS: Postop CABG. Heart size is upper normal. Negative for heart failure. Negative for pneumonia. Lungs are clear.  IMPRESSION: No active cardiopulmonary disease.   Electronically Signed   By: Marlan Palau M.D.   On: 11/21/2013 14:28   Ct Head Wo Contrast  11/21/2013   CLINICAL DATA:  Altered mental status.  EXAM: CT HEAD WITHOUT CONTRAST  TECHNIQUE: Contiguous axial images were obtained from the base of the skull through the vertex without contrast.  COMPARISON:  None  FINDINGS: No evidence for acute infarction, hemorrhage, mass lesion, hydrocephalus, or extra-axial fluid. Moderate cerebral and cerebellar atrophy. Chronic microvascular ischemic change affects the periventricular and subcortical white matter. The calvarium is intact. There is no sinus or mastoid fluid. Prior ocular surgery. A hearing is present in the left external canal.  IMPRESSION: No acute intracranial abnormality.  Chronic changes as described.   Electronically Signed   By: Davonna Belling M.D.   On: 11/21/2013 14:20    Scheduled Meds: . escitalopram  10  mg Oral QHS  . ferrous sulfate  325 mg Oral BID WC  . finasteride  5 mg Oral Daily  . hyoscyamine  0.125 mg Sublingual BID  . lactulose  30 g Oral QID  . pantoprazole  40 mg Oral BID WC   Continuous Infusions: . sodium chloride Stopped (11/21/13 1750)  . sodium chloride 75 mL/hr at 11/21/13 1750    Active Problems:   Chronic epigastric pain   Hyperlipidemia   Atrial fibrillation   Ischemic cardiomyopathy   HTN (hypertension)   Tobacco abuse   Esophageal varices in cirrhosis, mild pportal gastropathy   Duodenal stricture   Cirrhosis of liver without mention of alcohol   Hepatic encephalopathy   Pulmonary hypertension   Chronic combined systolic and diastolic CHF (congestive heart failure)    Increased ammonia level   Acute renal insufficiency   Encephalopathy acute    Time spent: 40 minutes   WOODS, CURTIS, J  [facility] Pager [phone]. If 7PM-7AM, please contact night-coverage at [email or web address], password [facility] P Thompson Md [email or web address] 11/22/2013, 12:12 PM  LOS: 1 day

## 2013-11-22 NOTE — Progress Notes (Signed)
Patient ID: Kirk [name], male   DOB: 1927/06/27, 80 y.o.   MRN: [id number]     Progress Note   Subjective  Sleeping, easily arouseable-oriented to place/time but confused about events-he says he has not vomited-nursing reports he vomited early this am. Had results from enemas but not from chronulac as yet   Objective   Vital signs in last 24 hours: Temp:  [97.8 F (36.6 C)-98 F (36.7 C)] 97.9 F (36.6 C) (01/17 0432) Pulse Rate:  [70-78] 78 (01/17 0432) Resp:  [18-22] 20 (01/17 0432) BP: (117-142)/(49-68) 142/66 mmHg (01/17 0432) SpO2:  [98 %-100 %] 99 % (01/17 0432) Weight:  [121 lb 4.1 oz (55 kg)-121 lb 9.6 oz (55.157 kg)] 121 lb 4.1 oz (55 kg) (01/17 0432) Last BM Date: 11/21/13 General:    Elderly WM in NAD, frail, fatigued appearing Heart:  Regular rate and rhythm; no murmurs Lungs: Respirations even and unlabored, lungs CTA bilaterally Abdomen:  Soft, nontender and nondistended. Normal bowel sounds.,+ ascites Extremities:  Without edema. Neurologic:  Alert and oriented,X 2  Weak, + asterixis. Psych:  Cooperative. Normal mood and affect.  Intake/Output from previous day: 01/16 0701 - 01/17 0700 In: 1257.5 [P.O.:270; I.V.:987.5] Out: 766 [Urine:676; Emesis/NG output:90] Intake/Output this shift: Total I/O In: -  Out: 300 [Urine:300]  Lab Results:  Recent Labs  11/21/13 1425 11/22/13 0519  WBC 5.1 4.4  HGB 8.2* 8.0*  HCT 24.0* 23.7*  PLT 127* 120*   BMET  Recent Labs  11/21/13 1425 11/22/13 0519  NA 140 140  K 5.2 4.8  CL 103 106  CO2 25 23  GLUCOSE 115* 110*  BUN 51* 50*  CREATININE 2.37* 2.08*  CALCIUM 8.3* 8.2*   LFT  Recent Labs  11/22/13 0519  PROT 6.8  ALBUMIN 2.2*  AST 21  ALT 13  ALKPHOS 142*  BILITOT 0.8   PT/INR  Recent Labs  11/21/13 1738 11/22/13 0520  LABPROT 15.6* 15.9*  INR 1.27 1.30    Studies/Results: Dg Chest 2 View  11/21/2013   CLINICAL DATA:  Cough and weakness.  EXAM: CHEST  2 VIEW  COMPARISON:   08/04/2013  FINDINGS: Postop CABG. Heart size is upper normal. Negative for heart failure. Negative for pneumonia. Lungs are clear.  IMPRESSION: No active cardiopulmonary disease.   Electronically Signed   By: Marlan Palauharles  Clark M.D.   On: 11/21/2013 14:28   Ct Head Wo Contrast  11/21/2013   CLINICAL DATA:  Altered mental status.  EXAM: CT HEAD WITHOUT CONTRAST  TECHNIQUE: Contiguous axial images were obtained from the base of the skull through the vertex without contrast.  COMPARISON:  None  FINDINGS: No evidence for acute infarction, hemorrhage, mass lesion, hydrocephalus, or extra-axial fluid. Moderate cerebral and cerebellar atrophy. Chronic microvascular ischemic change affects the periventricular and subcortical white matter. The calvarium is intact. There is no sinus or mastoid fluid. Prior ocular surgery. A hearing is present in the left external canal.  IMPRESSION: No acute intracranial abnormality.  Chronic changes as described.   Electronically Signed   By: Davonna BellingJohn  Curnes M.D.   On: 11/21/2013 14:20      Assessment / Plan:   #1 80 yo male with decompensated cirrhosis admitted with encephalopathy and acute renal failure. Pt with intermittent vomiting past few days ? viral  Still encephalopathic this am, no results from chronulac Will increase chronulac until having 3-5 loose stools per day Send for diagnostic only paracentesis-R/O SBP #2 anemia-stable #3 ARF- minimal improvement- continue to hold  diuretics, and continue fluids at 50 cc/hour. May need to increase IVFs.    Active Problems:   Chronic epigastric pain   Hyperlipidemia   Atrial fibrillation   Ischemic cardiomyopathy   HTN (hypertension)   Tobacco abuse   Esophageal varices in cirrhosis, mild pportal gastropathy   Duodenal stricture   Cirrhosis of liver without mention of alcohol   Hepatic encephalopathy   Pulmonary hypertension   Chronic combined systolic and diastolic CHF (congestive heart failure)   Increased ammonia  level   Acute renal insufficiency   Encephalopathy acute   LOS: 1 day   Kirk [name]  11/22/2013, 9:03 AM     Attending physician's note   I have taken an interval history, reviewed the chart and examined the patient. I agree with the Advanced Practitioner's note, impression and recommendations. No significant improvement except Cr slightly improved. Kirk [name] guided diagnostic paracentesis today-R/O SBP. Continue IVFs and may need to increase rate to address volume depletion.  Kirk [name]. Kirk Dar, MD [facility]

## 2013-11-23 DIAGNOSIS — I8510 Secondary esophageal varices without bleeding: Secondary | ICD-10-CM

## 2013-11-23 DIAGNOSIS — E43 Unspecified severe protein-calorie malnutrition: Secondary | ICD-10-CM | POA: Diagnosis present

## 2013-11-23 LAB — CBC WITH DIFFERENTIAL/PLATELET
Basophils Absolute: 0 10*3/uL (ref 0.0–0.1)
Basophils Relative: 1 % (ref 0–1)
EOS PCT: 2 % (ref 0–5)
Eosinophils Absolute: 0.1 10*3/uL (ref 0.0–0.7)
HCT: 24.5 % — ABNORMAL LOW (ref 39.0–52.0)
Hemoglobin: 7.9 g/dL — ABNORMAL LOW (ref 13.0–17.0)
LYMPHS ABS: 1.2 10*3/uL (ref 0.7–4.0)
Lymphocytes Relative: 24 % (ref 12–46)
MCH: 32.5 pg (ref 26.0–34.0)
MCHC: 32.2 g/dL (ref 30.0–36.0)
MCV: 100.8 fL — AB (ref 78.0–100.0)
Monocytes Absolute: 0.7 10*3/uL (ref 0.1–1.0)
Monocytes Relative: 14 % — ABNORMAL HIGH (ref 3–12)
Neutro Abs: 2.9 10*3/uL (ref 1.7–7.7)
Neutrophils Relative %: 59 % (ref 43–77)
PLATELETS: 119 10*3/uL — AB (ref 150–400)
RBC: 2.43 MIL/uL — ABNORMAL LOW (ref 4.22–5.81)
RDW: 16.4 % — AB (ref 11.5–15.5)
WBC: 4.8 10*3/uL (ref 4.0–10.5)

## 2013-11-23 LAB — COMPREHENSIVE METABOLIC PANEL
ALT: 11 U/L (ref 0–53)
AST: 19 U/L (ref 0–37)
Albumin: 2.1 g/dL — ABNORMAL LOW (ref 3.5–5.2)
Alkaline Phosphatase: 128 U/L — ABNORMAL HIGH (ref 39–117)
BILIRUBIN TOTAL: 0.7 mg/dL (ref 0.3–1.2)
BUN: 35 mg/dL — ABNORMAL HIGH (ref 6–23)
CHLORIDE: 112 meq/L (ref 96–112)
CO2: 22 meq/L (ref 19–32)
Calcium: 8.3 mg/dL — ABNORMAL LOW (ref 8.4–10.5)
Creatinine, Ser: 1.3 mg/dL (ref 0.50–1.35)
GFR calc Af Amer: 56 mL/min — ABNORMAL LOW (ref >90)
GFR, EST NON AFRICAN AMERICAN: 49 mL/min — AB (ref >90)
Glucose, Bld: 182 mg/dL — ABNORMAL HIGH (ref 70–99)
Potassium: 4.1 mEq/L (ref 3.7–5.3)
SODIUM: 145 meq/L (ref 137–147)
Total Protein: 6.5 g/dL (ref 6.0–8.3)

## 2013-11-23 LAB — MAGNESIUM: Magnesium: 2.4 mg/dL (ref 1.5–2.5)

## 2013-11-23 LAB — AMMONIA: AMMONIA: 61 umol/L — AB (ref 11–60)

## 2013-11-23 MED ORDER — LACTULOSE 10 GM/15ML PO SOLN
30.0000 g | Freq: Every morning | ORAL | Status: DC
  Administered 2013-11-24: 30 g via ORAL
  Filled 2013-11-23: qty 45

## 2013-11-23 MED ORDER — ENSURE COMPLETE PO LIQD
237.0000 mL | Freq: Three times a day (TID) | ORAL | Status: DC
  Administered 2013-11-23 – 2013-11-24 (×3): 237 mL via ORAL

## 2013-11-23 NOTE — Progress Notes (Signed)
Patient ID: Kirk [name], male   DOB: 1927-09-28, 80 y.o.   MRN: [id number]    Progress Note   Subjective  Feeling about the same-says he is not sleeping well because of restless legs-takes hydrocodone at night at home. Otherwise no specific c/o..." I don't know what I am trying to say"...appreciative of care Ammonia down to 61 Creat normalized Peritoneal fluid cell counts-not consistent with SBP   Objective   Vital signs in last 24 hours: Temp:  [97.2 F (36.2 C)-98.4 F (36.9 C)] 97.9 F (36.6 C) (01/18 0437) Pulse Rate:  [77-101] 77 (01/18 0437) Resp:  [18-22] 22 (01/18 0437) BP: (121-147)/(64-69) 147/66 mmHg (01/18 0437) SpO2:  [97 %-99 %] 98 % (01/18 0437) Weight:  [109 lb 9.1 oz (49.7 kg)] 109 lb 9.1 oz (49.7 kg) (01/18 0437) Last BM Date: 11/23/13 (HUGE) General:    Elderly WM  in NAD, mentating  better Heart:  Regular rate and rhythm; no murmurs Lungs: Respirations even and unlabored, lungs CTA bilaterally Abdomen:  Soft, nontender and nondistended. Normal bowel sounds. Extremities:  Without edema. Neurologic:  Alert and oriented, still mentating slowly, + asterixis Psych:  Cooperative. Normal mood and affect.  Intake/Output from previous day: 01/17 0701 - 01/18 0700 In: 1920 [P.O.:120; I.V.:1800] Out: 875 [Urine:875] Intake/Output this shift:    Lab Results:  Recent Labs  11/21/13 1425 11/22/13 0519 11/23/13 0610  WBC 5.1 4.4 4.8  HGB 8.2* 8.0* 7.9*  HCT 24.0* 23.7* 24.5*  PLT 127* 120* 119*   BMET  Recent Labs  11/21/13 1425 11/22/13 0519 11/23/13 0610  NA 140 140 145  K 5.2 4.8 4.1  CL 103 106 112  CO2 25 23 22   GLUCOSE 115* 110* 182*  BUN 51* 50* 35*  CREATININE 2.37* 2.08* 1.30  CALCIUM 8.3* 8.2* 8.3*   LFT  Recent Labs  11/23/13 0610  PROT 6.5  ALBUMIN 2.1*  AST 19  ALT 11  ALKPHOS 128*  BILITOT 0.7   PT/INR  Recent Labs  11/21/13 1738 11/22/13 0520  LABPROT 15.6* 15.9*  INR 1.27 1.30    Studies/Results: Dg Chest  2 View  11/21/2013   CLINICAL DATA:  Cough and weakness.  EXAM: CHEST  2 VIEW  COMPARISON:  08/04/2013  FINDINGS: Postop CABG. Heart size is upper normal. Negative for heart failure. Negative for pneumonia. Lungs are clear.  IMPRESSION: No active cardiopulmonary disease.   Electronically Signed   By: Kirk Palauharles  [name] M.D.   On: 11/21/2013 14:28   Ct Head Wo Contrast  11/21/2013   CLINICAL DATA:  Altered mental status.  EXAM: CT HEAD WITHOUT CONTRAST  TECHNIQUE: Contiguous axial images were obtained from the base of the skull through the vertex without contrast.  COMPARISON:  None  FINDINGS: No evidence for acute infarction, hemorrhage, mass lesion, hydrocephalus, or extra-axial fluid. Moderate cerebral and cerebellar atrophy. Chronic microvascular ischemic change affects the periventricular and subcortical white matter. The calvarium is intact. There is no sinus or mastoid fluid. Prior ocular surgery. A hearing is present in the left external canal.  IMPRESSION: No acute intracranial abnormality.  Chronic changes as described.   Electronically Signed   By: Kirk BellingJohn  [name] M.D.   On: 11/21/2013 14:20   [place] Paracentesis  11/22/2013   CLINICAL DATA:  Cirrhosis, encephalopathy, acute renal failure, ascites. Request is made for diagnostic paracentesis.  EXAM: ULTRASOUND GUIDED DIAGNOSTIC PARACENTESIS  COMPARISON:  PRIOR PARACENTESIS ON 09/11/2013.  PROCEDURE: An ultrasound guided paracentesis was thoroughly discussed with the patient  and questions answered. The benefits, risks, alternatives and complications were also discussed. The patient understands and wishes to proceed with the procedure. Written consent was obtained.  Ultrasound was performed to localize and mark an adequate pocket of fluid in the right mid to lower quadrant of the abdomen. The area was then prepped and draped in the normal sterile fashion. 1% Lidocaine was used for local anesthesia. Under ultrasound guidance a 19 gauge Yueh catheter was  introduced. Paracentesis was performed. The catheter was removed and a dressing applied.  COMPLICATIONS: None immediate  FINDINGS: A total of approximately 265 ml. of clear, light yellow. fluid was removed. The fluid sample was sent for laboratory analysis.  IMPRESSION: Successful ultrasound guided diagnostic paracentesis yielding 265 ml of ascites.  Read by: Kirk [name] ,P.A.-C.   Electronically Signed   By: Kirk [name] M.D.   On: 11/22/2013 12:11       Assessment / Plan:   #1 80 yo male with decompensated NASH cirrhosis, with acute encephalopathy ? precipitated by AKI, vs viral syndrome with nausea/vomiting Parameters all improved Continue off diuretics today, will stop IV fluids-would send home on lower dose of diuretics if restarted prior to discharge. #2 chronic anemia- stable #3 CHF/cardiomyopathy  Hopefully home tomorrow if continues to improve, will get OOB Will need office follow up a week after discharge.  Active Problems:   Chronic epigastric pain   Hyperlipidemia   Atrial fibrillation   Ischemic cardiomyopathy   HTN (hypertension)   Tobacco abuse   Esophageal varices in cirrhosis, mild pportal gastropathy   Duodenal stricture   Cirrhosis of liver without mention of alcohol   Hepatic encephalopathy   Pulmonary hypertension   Chronic combined systolic and diastolic CHF (congestive heart failure)   Increased ammonia level   Acute renal insufficiency   Encephalopathy acute    LOS: 2 days   Kirk [name]  11/23/2013, 8:51 AM     Attending physician's note   I have taken an interval history, reviewed the chart and examined the patient. I agree with the Advanced Practitioner's note, impression and recommendations. All parameters are improving. If he remains stable or further improves overnight would consider discharge tomorrow.   Kirk [name]. Kirk Dar, MD [facility]

## 2013-11-23 NOTE — Progress Notes (Signed)
INITIAL NUTRITION ASSESSMENT  DOCUMENTATION CODES Per approved criteria  -Severe malnutrition in the context of chronic illness  Pt meets criteria for severe MALNUTRITION in the context of chronic illness as evidenced by wt loss of 16% in <3 months and reported intake meeting <75% of estimated needs for >1 month and severe fat and muscle wasting.  INTERVENTION: Ensure Complete po TID, each supplement provides 350 kcal and 13 grams of protein.  NUTRITION DIAGNOSIS: Inadequate oral intake related to chronic illness as evidenced by wt loss.   Goal: Pt to meet >/= 90% of their estimated nutrition needs   Monitor:  Wt, po intake, acceptance of supplements, labs  Reason for Assessment: MST  80 y.o. male  Admitting Dx: <principal problem not specified>  ASSESSMENT: 80 y.o. male WM PMHx HTN, atrial fibrillation, ischemic cardiomyopathy, chronic systolic heart failure, S/P CABG approximately 2001, Nash-induced cirrhosis, decompensated with history of esophageal varices and portal hypertensive gastropathy, diverticular disease S/P partial colonic resection, chronic epigastric pain. Presented to ED with increasing confusion x2 days.  Pt reports that he suddenly started losing weight. He reports a >20 lb wt loss in just over a month. He says that he is eating, but very little. Pt agreed to accept Ensure Complete supplements TID. He says that he will do what it take to "get well."  Nutrition Focused Physical Exam:  Subcutaneous Fat:  Orbital Region: severe wasting Upper Arm Region: severe wasting Thoracic and Lumbar Region: n/a  Muscle:  Temple Region: severe wasting Clavicle Bone Region: severe wasting Clavicle and Acromion Bone Region: severe wasting Scapular Bone Region: n/a Dorsal Hand: severe wasting Patellar Region: n/a Anterior Thigh Region: n/a Posterior Calf Region: severe wasting  Edema: none   Height: Ht Readings from Last 1 Encounters:  11/21/13 5\' 6"  (1.676 m)     Weight: Wt Readings from Last 1 Encounters:  11/23/13 109 lb 9.1 oz (49.7 kg)    Ideal Body Weight: 63.8 kg  % Ideal Body Weight: 78%  Wt Readings from Last 10 Encounters:  11/23/13 109 lb 9.1 oz (49.7 kg)  11/18/13 127 lb (57.607 kg)  10/08/13 150 lb 9.6 oz (68.312 kg)  09/24/13 150 lb 3.2 oz (68.13 kg)  09/16/13 147 lb 9.6 oz (66.951 kg)  09/10/13 156 lb (70.761 kg)  08/05/13 142 lb 3.2 oz (64.501 kg)  07/22/13 143 lb (64.864 kg)  06/30/13 142 lb (64.411 kg)  05/01/13 137 lb (62.143 kg)    Usual Body Weight: 130-140 lbs  % Usual Body Weight: 84%  BMI:  Body mass index is 17.69 kg/(m^2).  Estimated Nutritional Needs: Kcal: 1700-1900 Protein: 75-85 g Fluid: 1.7-1.9 L  Skin: WNL  Diet Order: Sodium Restricted  EDUCATION NEEDS: -Education needs addressed   Intake/Output Summary (Last 24 hours) at 11/23/13 1117 Last data filed at 11/23/13 0700  Gross per 24 hour  Intake   1920 ml  Output    375 ml  Net   1545 ml    Last BM: 1/18   Labs:   Recent Labs Lab 11/21/13 1425 11/22/13 0519 11/23/13 0610  NA 140 140 145  K 5.2 4.8 4.1  CL 103 106 112  CO2 25 23 22   BUN 51* 50* 35*  CREATININE 2.37* 2.08* 1.30  CALCIUM 8.3* 8.2* 8.3*  MG  --  2.3 2.4  GLUCOSE 115* 110* 182*    CBG (last 3)   Recent Labs  11/21/13 2100  GLUCAP 131*    Scheduled Meds: . escitalopram  10 mg  Oral QHS  . ferrous sulfate  325 mg Oral BID WC  . finasteride  5 mg Oral Daily  . hyoscyamine  0.125 mg Sublingual BID  . lactulose  30 g Oral QID  . pantoprazole  40 mg Oral BID WC    Continuous Infusions: . sodium chloride Stopped (11/21/13 1750)    Past Medical History  Diagnosis Date  . GERD (gastroesophageal reflux disease)   . Diverticulosis     s/p partial colon resection  . CAD (coronary artery disease)     s/p MI 1999;  s/p CABG 2005 (L-LAD, S-Dx, S-OM1, S-PDA); patient had post op AFib;  Myoview 8/10: mild fixed lateral defect; no ischemia, EF 47%   . Hyperlipemia   . Ischemic cardiomyopathy     echo 1/11: mild MR, mild LAE, EF 40-45%;  echo 10/12: EF 40-45%, IL AK, Post AK, grade 2 diast dysfxn, mild to mod MR, mild BAE, PASP 38;   Echo 9/14:  Mild focal basal septal hypertrophy, EF 40-45%, IL, inf and post HK, Gr 1 DD, MAC, mild MR, mild LAE, mild RVE, mild RAE, PASP 32, L pleural effusion  . Hypertension   . Arthritis   . Common bile duct dilatation   . Pancreatitis     post-ERCP 11/2009  . Colitis, ischemic   . Chronic systolic heart failure     Echo 09/05/11: mild focal basal septal hypertrophy, EF 40-45%, basal to mid IL AK, post AK, grade 2 diast dysfxn, mild to mod MR, mild BAE, PASP 38  . Atrial fibrillation     post CABG in 2005 and in setting of acute illness in 1/11 (post ERCP pancreatitis) - coumadin not felt to be indicated  . Lumbar spondylosis   . Elevated LFTs     statin d/c'd in past due to   . BPH (benign prostatic hyperplasia)   . Sinus bradycardia     on beta blockers  . Ulcer 10/2012    in pylorus  . Allergy   . Anemia   . Diverticulitis     with partial colectomy  . Cirrhosis     Negative workup for viral hepatitis, autoimmune hepatitis, hemochromatosis. Never heavy ETOH. Suspect NASH. EGD (12/13) with esophageal varices and portal hypertensive gastropathy. EGD (3/14) with 3 small esophageal varices, portal hypertensive gastropathy.  . Myocardial infarction 1991; 1999  . Esophageal varices in cirrhosis     Hattie Perch/notes 08/04/2013 (08/05/2013)  . Depression   . Anxiety   . Hyperkalemia requiring hospitalization 08/04/2013  . Cirrhosis     Past Surgical History  Procedure Laterality Date  . Colon resection      "removed 12-15' of colon" (08/05/2013)  . Back surgery    . Ercp  11/13/2009    dilated bile ducts, atypical cytology, repeat attempt normal pancreatogram but unsuccessful biliary cannulation  . Angioplasty  1999  . Shoulder open rotator cuff repair Bilateral     "I've had a total of 3 OR's for this"  (08/05/2013)  . Cataract extraction w/ intraocular lens implant Right   . Esophagogastroduodenoscopy    . Flexible sigmoidoscopy    . Colonoscopy    . Tonsillectomy    . Coronary angioplasty with stent placement      "before the bypass" (08/05/2013)  . Coronary artery bypass graft  2005    "CABG X7" (08/05/2013)x 4  . Lumbar disc surgery      Ebbie LatusHaley Hawkins RD, LDN

## 2013-11-23 NOTE — Discharge Summary (Addendum)
Physician Discharge Summary  Kirk [name] WJX:[id number] DOB: 1927/10/28 DOA: 11/21/2013  PCP: Minda Meo, MD  Admit date: 11/21/2013 Discharge date: 11/23/2013  Time spent:35 minutes  Recommendations for Outpatient Follow-up:   Hepatic encephalopathy  -Admission ammonia= 142 ; current ammonia= 61  -Discharge on Lactulose 30 cc QD, (titrate to 2-3 loose BMs a day) hepatologist to monitor ammonia level    Esophageal varices compared to cirrhosis  -No reported symptoms of a matter emesis will monitor closely   Cirrhosis of liver without mention of alcohol  -Will monitor liver enzymes closely (within normal limit) 1/18  -Per [facility] will restart patient on much lower dose of diuretics Aldactone 100 mg daily,  40 mg Lasix daily. -BMET one week after discharge, followup with hepatologist to adjust medications -2 gm sodium diet to be followed at home  -Continue to Hold all medication that are hepatic toxic (Tylenol containing products)  Acute renal failure  -Resolved, monitor closely after restarting diuretics   -1/18 creatinine= 0.97, admission creatinine= 2.37  Ischemic cardiomyopathy/combined systolic and diastolic CHF  -Obtain troponin x3, negative  -EKG 1/16 normal sinus rhythm  -Echocardiogram; see results below    Atrial fibrillation  -Currently in NSR  -Rate controlled on no cardiac medication   HTN  -Within AHA guidelines   Anemia  -Monitor closely currently stable   SBP?  -Dr. Claudette Head ([facility]) requested paracentesis to rule out SBP  -11/22/2048 US guided diagnostic paracentesis performed yielding 265 cc's clear, light yellow fluid. The fluid was sent to the lab for preordered studies. No immediate complications. -Fluid from paracentesis;NTD (pending)   Discharge Diagnoses:  Active Problems:   Chronic epigastric pain   Hyperlipidemia   Atrial fibrillation   Ischemic cardiomyopathy   HTN (hypertension)   Tobacco abuse   Esophageal varices  in cirrhosis, mild pportal gastropathy   Duodenal stricture   Cirrhosis of liver without mention of alcohol   Hepatic encephalopathy   Pulmonary hypertension   Chronic combined systolic and diastolic CHF (congestive heart failure)   Increased ammonia level   Acute renal insufficiency   Encephalopathy acute   Protein-calorie malnutrition, severe   Discharge Condition: Stable  Diet recommendation: Hepatic  Filed Weights   11/21/13 1625 11/22/13 0432 11/23/13 0437  Weight: 55.157 kg (121 lb 9.6 oz) 55 kg (121 lb 4.1 oz) 49.7 kg (109 lb 9.1 oz)    History of present illness:  Kirk [name] is a 80 y.o. male WM PMHx HTN, atrial fibrillation, ischemic cardiomyopathy, chronic systolic heart failure, S/P CABG approximately 2001, Nash-induced cirrhosis, decompensated with history of esophageal varices and portal hypertensive gastropathy, diverticular disease S/P partial colonic resection, chronic epigastric pain. Presented to ED with increasing confusion x2 days. Increased forgetfulness and increased fatigue/sleeping. Lasix and spiral-like tone were increased on Tuesday by GI physician. Dr. Claudette Head ( GI) will see patient. Patient's wife states that this morning while she was making banana pancake patient became confused, disorganized speech, and after they were on their way to the emergency department, he was not making sense. Patient does not recall this part of the day. Patient states he does recall being in the emergency department in speaking with medical staff, however wife states patient was still mildly confused in the emergency department (stated he was 6 feet 5 inches tall). Patient states that he's had 2 episodes of emesis since visiting gastroenterologist on Tuesday. Negative diarrhea, negative fever, currently negative nausea. Patient and wife states unsure of patient's base weight. 11/22/2013  patient sleepy, but arousable. More confused this a.m. 11/23/2013 patient much more  alert today A./O. x4, joking with family and medical staff. 11/24/2013 patient feeling well today, states diarrhea has subsided since decreasing lactulose. Was able to eat breakfast this a.m. without N./V./abdominal pain     Procedures:   Echocardiogram 11/22/2013 - Left ventricle: The cavity size was normal. Systolic function was mildly to moderately reduced. -LVEF= 40% to 45%. - Mitral valve: Moderate regurgitation. - Left atrium: The atrium was moderately to severely dilated. - Pulmonary arteries: Systolic pressure was mildly increased. PA peak pressure: 31mm Hg (S).  11/22/2013 US guided diagnostic paracentesis performed yielding 265 cc's clear, light yellow fluid. The fluid was sent to the lab for preordered studies. No immediate complications.  A. no growth (pending), no organisms seen (pending)   Echocardiogram 07/14/2013  - Left ventricle: mild focal basal hypertrophy of the septum. Systolic function was mildly to moderately reduced.  -LVEF= 40% to 45%.  -Severe hypokinesis of the entire inferolateral, inferior and basal posterior myocardium.  -(grade 1 diastolic dysfunction). - Mitral valve: Mildly calcified annulus. Mild regurgitation. - Left atrium: The atrium was mildly dilated. - Right ventricle: The cavity size was mildly dilated. - Right atrium: The atrium was mildly dilated. - Pulmonary arteries: PA peak pressure: 32mm Hg (S). - Pericardium, extracardiac: There was a left pleural effusion.    Consults  Dr. Claudette HeadMalcolm Stark ([facility])     Discharge Exam: Filed Vitals:   11/22/13 1106 11/22/13 1352 11/22/13 2028 11/23/13 0437  BP: 137/64 147/69 121/68 147/66  Pulse:  85 101 77  Temp:  97.2 F (36.2 C) 98.4 F (36.9 C) 97.9 F (36.6 C)  TempSrc:  Oral Oral Oral  Resp:  18 22 22   Height:      Weight:    49.7 kg (109 lb 9.1 oz)  SpO2:  99% 97% 98%   General: A./O. X4, NAD  Cardiovascular: Regular rhythm and rate, negative murmurs rubs gallops, DP/PT pulse +1  Respiratory: Clear to auscultation bilateral  Abdomen: Soft, nontender, nondistended, plus bowel sound  Skin: Negative lesions/rash, negative jaundice  Musculoskeletal: Negative pitting edema bilateral lower extremity  Discharge Instructions     Medication List    ASK your doctor about these medications       aspirin EC 81 MG tablet  Take 1 tablet (81 mg total) by mouth daily.     Diclofenac Sodium CR 100 MG 24 hr tablet  Take 100 mg by mouth as needed for pain.     escitalopram 10 MG tablet  Commonly known as:  LEXAPRO  Take 10 mg by mouth daily.     ferrous sulfate 325 (65 FE) MG tablet  Take 325 mg by mouth 2 (two) times daily with a meal.     finasteride 5 MG tablet  Commonly known as:  PROSCAR  Take 5 mg by mouth daily.     furosemide 20 MG tablet  Commonly known as:  LASIX  Take 6 tablets (120 mg total) by mouth daily.     HYDROcodone-acetaminophen 5-325 MG per tablet  Commonly known as:  NORCO/VICODIN  Take 1 tablet by mouth every 6 (six) hours as needed for moderate pain.     hyoscyamine 0.125 MG Tbdp disintergrating tablet  Commonly known as:  ANASPAZ  1-2 dissolved under tongue every 4 hours as needed for abdominal pain     lactulose 10 GM/15ML solution  Commonly known as:  CHRONULAC  Take 30 mLs (20 g  total) by mouth daily.     losartan 50 MG tablet  Commonly known as:  COZAAR  Take 0.5 tablets (25 mg total) by mouth daily.     pantoprazole 40 MG tablet  Commonly known as:  PROTONIX  1 by mouth each day 30 minutes before meal     pravastatin 40 MG tablet  Commonly known as:  PRAVACHOL  Take 1 tablet (40 mg total) by mouth every evening.     spironolactone 25 MG tablet  Commonly known as:  ALDACTONE  Take 8 tablets (200 mg total) by mouth daily.       Allergies  Allergen Reactions  . Erythromycin Itching and Rash    Patient allergic to all Mycins   . Penicillins Rash  . Xifaxan [Rifaximin] Rash      The results of significant  diagnostics from this hospitalization (including imaging, microbiology, ancillary and laboratory) are listed below for reference.    Significant Diagnostic Studies: Dg Chest 2 View  11/21/2013   CLINICAL DATA:  Cough and weakness.  EXAM: CHEST  2 VIEW  COMPARISON:  08/04/2013  FINDINGS: Postop CABG. Heart size is upper normal. Negative for heart failure. Negative for pneumonia. Lungs are clear.  IMPRESSION: No active cardiopulmonary disease.   Electronically Signed   By: Marlan Palau M.D.   On: 11/21/2013 14:28   Ct Head Wo Contrast  11/21/2013   CLINICAL DATA:  Altered mental status.  EXAM: CT HEAD WITHOUT CONTRAST  TECHNIQUE: Contiguous axial images were obtained from the base of the skull through the vertex without contrast.  COMPARISON:  None  FINDINGS: No evidence for acute infarction, hemorrhage, mass lesion, hydrocephalus, or extra-axial fluid. Moderate cerebral and cerebellar atrophy. Chronic microvascular ischemic change affects the periventricular and subcortical white matter. The calvarium is intact. There is no sinus or mastoid fluid. Prior ocular surgery. A hearing is present in the left external canal.  IMPRESSION: No acute intracranial abnormality.  Chronic changes as described.   Electronically Signed   By: Davonna Belling M.D.   On: 11/21/2013 14:20   US Paracentesis  11/22/2013   CLINICAL DATA:  Cirrhosis, encephalopathy, acute renal failure, ascites. Request is made for diagnostic paracentesis.  EXAM: ULTRASOUND GUIDED DIAGNOSTIC PARACENTESIS  COMPARISON:  PRIOR PARACENTESIS ON 09/11/2013.  PROCEDURE: An ultrasound guided paracentesis was thoroughly discussed with the patient and questions answered. The benefits, risks, alternatives and complications were also discussed. The patient understands and wishes to proceed with the procedure. Written consent was obtained.  Ultrasound was performed to localize and mark an adequate pocket of fluid in the right mid to lower quadrant of the abdomen.  The area was then prepped and draped in the normal sterile fashion. 1% Lidocaine was used for local anesthesia. Under ultrasound guidance a 19 gauge Yueh catheter was introduced. Paracentesis was performed. The catheter was removed and a dressing applied.  COMPLICATIONS: None immediate  FINDINGS: A total of approximately 265 ml. of clear, light yellow. fluid was removed. The fluid sample was sent for laboratory analysis.  IMPRESSION: Successful ultrasound guided diagnostic paracentesis yielding 265 ml of ascites.  Read by: Jeananne Rama ,P.A.-C.   Electronically Signed   By: Richarda Overlie M.D.   On: 11/22/2013 12:11    Microbiology: Recent Results (from the past 240 hour(s))  BODY FLUID CULTURE     Status: None   Collection Time    11/22/13 11:35 AM      Result Value Range Status   Specimen Description ASCITIC  Final   Special Requests Normal   Final   Gram Stain PENDING   Incomplete   Culture     Final   Value: NO GROWTH 1 DAY     Performed at [facility]   Report Status PENDING   Incomplete     Labs: Basic Metabolic Panel:  Recent Labs Lab 11/17/13 0920 11/21/13 1425 11/22/13 0519 11/23/13 0610  NA 138 140 140 145  K 3.6 5.2 4.8 4.1  CL 104 103 106 112  CO2 30 25 23 22   GLUCOSE 129* 115* 110* 182*  BUN 23 51* 50* 35*  CREATININE 1.2 2.37* 2.08* 1.30  CALCIUM 8.3* 8.3* 8.2* 8.3*  MG  --   --  2.3 2.4   Liver Function Tests:  Recent Labs Lab 11/21/13 1425 11/22/13 0519 11/23/13 0610  AST 25 21 19   ALT 13 13 11   ALKPHOS 170* 142* 128*  BILITOT 0.6 0.8 0.7  PROT 7.1 6.8 6.5  ALBUMIN 2.3* 2.2* 2.1*   No results found for this basename: LIPASE, AMYLASE,  in the last 168 hours  Recent Labs Lab 11/21/13 1425 11/22/13 0916 11/23/13 0610  AMMONIA 142* 92* 61*   CBC:  Recent Labs Lab 11/18/13 1705 11/21/13 1425 11/22/13 0519 11/23/13 0610  WBC 6.8 5.1 4.4 4.8  NEUTROABS 4.6 3.0 2.5 2.9  HGB 8.3 Repeated and verified X2.* 8.2* 8.0* 7.9*  HCT 24.8  Repeated and verified X2.* 24.0* 23.7* 24.5*  MCV 99.0 96.4 97.1 100.8*  PLT 138.0* 127* 120* 119*   Cardiac Enzymes:  Recent Labs Lab 11/21/13 1738 11/22/13 0020 11/22/13 0519  TROPONINI <0.30 <0.30 <0.30   BNP: BNP (last 3 results)  Recent Labs  05/12/13 0823 06/30/13 0946 11/21/13 1425  PROBNP 342.0* 364.0* 1437.0*   CBG:  Recent Labs Lab 11/21/13 2100  GLUCAP 131*       Signed:  Carolyne Littles, MD [facility] [phone] pager

## 2013-11-24 ENCOUNTER — Other Ambulatory Visit: Payer: Self-pay | Admitting: Nurse Practitioner

## 2013-11-24 ENCOUNTER — Encounter: Payer: Self-pay | Admitting: Physician Assistant

## 2013-11-24 DIAGNOSIS — K7460 Unspecified cirrhosis of liver: Secondary | ICD-10-CM

## 2013-11-24 DIAGNOSIS — I5023 Acute on chronic systolic (congestive) heart failure: Secondary | ICD-10-CM

## 2013-11-24 LAB — CBC WITH DIFFERENTIAL/PLATELET
Basophils Absolute: 0 10*3/uL (ref 0.0–0.1)
Basophils Relative: 1 % (ref 0–1)
Eosinophils Absolute: 0.1 10*3/uL (ref 0.0–0.7)
Eosinophils Relative: 3 % (ref 0–5)
HCT: 23.8 % — ABNORMAL LOW (ref 39.0–52.0)
Hemoglobin: 7.7 g/dL — ABNORMAL LOW (ref 13.0–17.0)
LYMPHS ABS: 1.2 10*3/uL (ref 0.7–4.0)
LYMPHS PCT: 26 % (ref 12–46)
MCH: 32.9 pg (ref 26.0–34.0)
MCHC: 32.4 g/dL (ref 30.0–36.0)
MCV: 101.7 fL — AB (ref 78.0–100.0)
Monocytes Absolute: 0.6 10*3/uL (ref 0.1–1.0)
Monocytes Relative: 13 % — ABNORMAL HIGH (ref 3–12)
NEUTROS PCT: 59 % (ref 43–77)
Neutro Abs: 2.8 10*3/uL (ref 1.7–7.7)
PLATELETS: 102 10*3/uL — AB (ref 150–400)
RBC: 2.34 MIL/uL — AB (ref 4.22–5.81)
RDW: 16.2 % — ABNORMAL HIGH (ref 11.5–15.5)
WBC: 4.8 10*3/uL (ref 4.0–10.5)

## 2013-11-24 LAB — PATHOLOGIST SMEAR REVIEW

## 2013-11-24 LAB — AMMONIA: AMMONIA: 48 umol/L (ref 11–60)

## 2013-11-24 LAB — COMPREHENSIVE METABOLIC PANEL
ALT: 11 U/L (ref 0–53)
AST: 19 U/L (ref 0–37)
Albumin: 2.1 g/dL — ABNORMAL LOW (ref 3.5–5.2)
Alkaline Phosphatase: 121 U/L — ABNORMAL HIGH (ref 39–117)
BUN: 20 mg/dL (ref 6–23)
CALCIUM: 8.5 mg/dL (ref 8.4–10.5)
CO2: 21 meq/L (ref 19–32)
CREATININE: 0.97 mg/dL (ref 0.50–1.35)
Chloride: 108 mEq/L (ref 96–112)
GFR, EST AFRICAN AMERICAN: 85 mL/min — AB (ref >90)
GFR, EST NON AFRICAN AMERICAN: 74 mL/min — AB (ref >90)
Glucose, Bld: 137 mg/dL — ABNORMAL HIGH (ref 70–99)
Potassium: 3.9 mEq/L (ref 3.7–5.3)
Sodium: 140 mEq/L (ref 137–147)
Total Bilirubin: 0.6 mg/dL (ref 0.3–1.2)
Total Protein: 6.5 g/dL (ref 6.0–8.3)

## 2013-11-24 LAB — MAGNESIUM: Magnesium: 2.4 mg/dL (ref 1.5–2.5)

## 2013-11-24 MED ORDER — OXYCODONE HCL 5 MG PO TABS: 5.0000 mg | ORAL_TABLET | ORAL | Status: DC | PRN

## 2013-11-24 MED ORDER — LACTULOSE 10 GM/15ML PO SOLN: 30.0000 g | Freq: Every morning | ORAL | Status: DC

## 2013-11-24 MED ORDER — ENSURE COMPLETE PO LIQD: 237.0000 mL | Freq: Three times a day (TID) | ORAL | Status: DC

## 2013-11-24 MED ORDER — SPIRONOLACTONE 25 MG PO TABS: 100.0000 mg | ORAL_TABLET | Freq: Every day | ORAL | Status: DC

## 2013-11-24 MED ORDER — FUROSEMIDE 20 MG PO TABS: 40.0000 mg | ORAL_TABLET | Freq: Every day | ORAL | Status: DC

## 2013-11-24 NOTE — Progress Notes (Deleted)
Patient is scheduled for ERCP and sphincterotomy by Dr. Christella HartiganJacobs today.

## 2013-11-25 LAB — BODY FLUID CULTURE
Culture: NO GROWTH
Special Requests: NORMAL

## 2013-12-05 ENCOUNTER — Encounter: Payer: Self-pay | Admitting: Physician Assistant

## 2013-12-05 ENCOUNTER — Ambulatory Visit (INDEPENDENT_AMBULATORY_CARE_PROVIDER_SITE_OTHER): Payer: Medicare Other | Admitting: Physician Assistant

## 2013-12-05 VITALS — BP 90/40 | HR 72 | Ht 67.0 in | Wt 126.0 lb

## 2013-12-05 DIAGNOSIS — D649 Anemia, unspecified: Secondary | ICD-10-CM

## 2013-12-05 DIAGNOSIS — K7290 Hepatic failure, unspecified without coma: Secondary | ICD-10-CM

## 2013-12-05 DIAGNOSIS — K7460 Unspecified cirrhosis of liver: Secondary | ICD-10-CM

## 2013-12-05 DIAGNOSIS — K7682 Hepatic encephalopathy: Secondary | ICD-10-CM

## 2013-12-05 NOTE — Progress Notes (Signed)
Subjective:    Patient ID: Kirk [name], male    DOB: October 14, 1927, 80 y.o.   MRN: [id number]  HPI  Kirk [name] is a very nice 80 year old white male known to Dr. 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 Most recent EGD September 2014 with small varices. He also has had problems with volume overload and hepatic encephalopathy. Other medical problems include coronary artery disease status post MI and then CABG in 2005. He has an ischemic cardiomyopathy with EF of 40-45% and history of atrial fibrillation. Patient comes in today for a post hospital followup after being hospitalized for about 4 days earlier in January with acute hepatic encephalopathy. He had been taking his usual dose of Chronulac 30 cc daily but had not been having daily bowel movements. He also had had an increase in his diuretics within the week of that episode occurring. And when he presented to the hospital he did have acute renal insufficiency. There was no evidence of any underlying infection with negative chest x-ray negative diagnostic paracentesis labs etc. and no evidence for active bleeding. He was gently hydrated and diuretics were held and he had normalization of his renal function. We also increased his Chronulac until he was stooling 2-3 times per day. He had been on Xifaxan the past but was intolerant.  Patient and his wife feel that heaves and has been doing well since discharge. He's been ambulating without any difficulty in his wife states that he is mentating normally. He has not been having any problems with daytime lethargy. He has had some intermittent nausea especially after taking 45 cc of Chronulac at one time. He had been seen by Dr. Jacky KindleAronson earlier in the week and copies of those labs were faxed. His hemoglobin is stable at 8.8, BUN 21 creatinine 1.1 sodium 137 liver function studies normal WBC of 6.3 hemoglobin 8.8  hematocrit of 26.7 and platelets of 248 venous ammonia there was 83 which is in the normal range for that lab. At the time of discharge from the hospital his Lasix had been decreased to 40 mg by mouth daily and Aldactone was decreased to 100 mg by mouth daily.    Review of Systems  HENT: Negative.   Eyes: Negative.   Respiratory: Negative.   Cardiovascular: Positive for leg swelling.  Gastrointestinal: Positive for nausea.  Endocrine: Negative.   Genitourinary: Negative.   Musculoskeletal: Positive for gait problem.  Skin: Negative.   Allergic/Immunologic: Negative.   Neurological: Positive for weakness.  Hematological: Negative.   Psychiatric/Behavioral: Negative.    Outpatient Prescriptions Prior to Visit  Medication Sig Dispense Refill  . aspirin EC 81 MG tablet Take 1 tablet (81 mg total) by mouth daily.      Marland Kitchen. escitalopram (LEXAPRO) 10 MG tablet Take 10 mg by mouth daily.      . feeding supplement, ENSURE COMPLETE, (ENSURE COMPLETE) LIQD Take 237 mLs by mouth 3 (three) times daily between meals.  90 Bottle  3  . furosemide (LASIX) 20 MG tablet Take 2 tablets (40 mg total) by mouth daily.  540 tablet  3  . hyoscyamine (ANASPAZ) 0.125 MG TBDP 1-2 dissolved under tongue every 4 hours as needed for abdominal pain  60 tablet  5  . lactulose (CHRONULAC) 10 GM/15ML solution Take 45 mLs (30 g total) by mouth every morning. Take 45mls (30 g total) by mouth every morning. Titrate for loose 2-3 BMs per day  500 mL  0  . losartan (COZAAR) 50 MG tablet Take 0.5 tablets (25 mg total) by mouth daily.  60 tablet  6  . oxyCODONE (OXY IR/ROXICODONE) 5 MG immediate release tablet Take 1 tablet (5 mg total) by mouth every 4 (four) hours as needed for moderate pain.  30 tablet  0  . pantoprazole (PROTONIX) 40 MG tablet 1 by mouth each day 30 minutes before meal  90 tablet  3  . spironolactone (ALDACTONE) 25 MG tablet Take 4 tablets (100 mg total) by mouth daily.  360 tablet  3  . Diclofenac Sodium CR  100 MG 24 hr tablet Take 100 mg by mouth as needed for pain.      . ferrous sulfate 325 (65 FE) MG tablet Take 325 mg by mouth 2 (two) times daily with a meal.      . finasteride (PROSCAR) 5 MG tablet Take 5 mg by mouth daily.        . pravastatin (PRAVACHOL) 40 MG tablet Take 1 tablet (40 mg total) by mouth every evening.  90 tablet  1   No facility-administered medications prior to visit.   Allergies  Allergen Reactions  . Erythromycin Itching and Rash    Patient allergic to all Mycins   . Penicillins Rash  . Xifaxan [Rifaximin] Rash   Patient Active Problem List   Diagnosis Date Noted  . Protein-calorie malnutrition, severe 11/23/2013  . Hepatic encephalopathy 11/21/2013  . Pulmonary hypertension 11/21/2013  . Chronic combined systolic and diastolic CHF (congestive heart failure) 11/21/2013  . Increased ammonia level 11/21/2013  . Acute renal insufficiency 11/21/2013  . Ascites 09/10/2013  . Edema 09/10/2013  . Unstable gait 09/10/2013  . Anemia 09/10/2013  . Bradycardia 08/04/2013  . Esophageal varices in cirrhosis, mild pportal gastropathy 11/14/2012  . Duodenal stricture 11/14/2012  . Cirrhosis of liver without mention of alcohol 11/14/2012  . Acute on chronic systolic heart failure 08/24/2011  . Hyperlipidemia 08/24/2011  . Atrial fibrillation 08/24/2011  . Ischemic cardiomyopathy 08/24/2011  . HTN (hypertension) 08/24/2011  . Tobacco abuse 08/24/2011  . CAD (coronary artery disease) 04/07/2011  . Chronic epigastric pain 03/21/2011  . ALKALINE PHOSPHATASE, ELEVATED 11/17/2009  . Dilated bile ducts, unclear significance 11/17/2009   History  Substance Use Topics  . Smoking status: Current Every Day Smoker -- 68 years    Types: Cigars  . Smokeless tobacco: Never Used     Comment: 08/05/2013 "probably 3 cigars/day"; form given 10-08-13  . Alcohol Use: No      family history includes Congestive Heart Failure in his mother; Diabetes in his mother; Heart disease in  his mother; Lung cancer in his father. There is no history of Colon cancer, Esophageal cancer, Rectal cancer, or Stomach cancer.  Objective:   Physical Exam  Wd elderly WM in NAD. Accompanied by his wife blood pressure 90/40 pulse 72 height 5 foot 7 weight 126. HEENT; nontraumatic normocephalic EOMI PERRLA sclera anicteric, Supple ;no JVD, Cardiovascular; regular rate and rhythm with S1-S2 he does have a sternal incisional scar pulmonary ;clear bilaterally ,abdomen; soft there is no fluid wave is nondistended nontender no palpable mass or hepatosplenomegaly, Rectal ;exam not done  Extremities; trace edema in the ankles, Neuro/psych ;mood and affect normal and appropriate there is no asterixis       Assessment & Plan:  #34 80 year old male with probable Nash-induced cirrhosis decompensated with anasarca, portal hypertension with history of varices and portal gastropathy and hepatic encephalopathy. Patient recently hospitalized with an acute episode  of hepatic encephalopathy possibly on the basis of acute renal insufficiency Patient is much improved, he does not appear encephalopathic today and kidney function is normalized on lower dose diuretics #2 chronic anemia-stable  Plan; Will increase Chronulac to 30 cc by mouth twice daily morning and lunchtime Continue Lasix 40 mg by mouth every morning Continue Aldactone 100 mg by mouth every morning  Patient has followup with Dr. Jacky Kindle mid February for labs and will followup with Dr. Leone Payor in early March.

## 2013-12-05 NOTE — Patient Instructions (Signed)
Please stay on spironolactone 100 mg one time a day Please stay of Furosemide 40 mg one time a day Please take your Chronulac1 tbsp with breakfast and lunch  Follow up with Dr. Leone PayorGessner in March

## 2013-12-05 NOTE — Progress Notes (Signed)
Agree with Ms. Esterwood's assessment and plan. Myia Bergh E. Venola Castello, MD, FACG   

## 2013-12-17 ENCOUNTER — Other Ambulatory Visit (HOSPITAL_COMMUNITY): Payer: Self-pay | Admitting: *Deleted

## 2013-12-17 ENCOUNTER — Ambulatory Visit (HOSPITAL_COMMUNITY)
Admission: RE | Admit: 2013-12-17 | Discharge: 2013-12-17 | Disposition: A | Discharge status: Home / Self Care | Payer: Medicare Other | Source: Ambulatory Visit | Attending: Internal Medicine | Admitting: Internal Medicine

## 2013-12-17 DIAGNOSIS — D649 Anemia, unspecified: Secondary | ICD-10-CM | POA: Insufficient documentation

## 2013-12-17 LAB — ABO/RH: ABO/RH(D): O POS

## 2013-12-17 LAB — PREPARE RBC (CROSSMATCH)

## 2013-12-18 ENCOUNTER — Encounter (HOSPITAL_COMMUNITY)
Admission: RE | Admit: 2013-12-18 | Discharge: 2013-12-18 | Disposition: A | Discharge status: Home / Self Care | Payer: Medicare Other | Source: Ambulatory Visit | Attending: Internal Medicine | Admitting: Internal Medicine

## 2013-12-18 DIAGNOSIS — D649 Anemia, unspecified: Secondary | ICD-10-CM | POA: Insufficient documentation

## 2013-12-18 MED ORDER — FUROSEMIDE 10 MG/ML IJ SOLN
20.0000 mg | Freq: Once | INTRAMUSCULAR | Status: AC
  Administered 2013-12-18: 20 mg via INTRAVENOUS
  Filled 2013-12-18: qty 2

## 2013-12-19 LAB — TYPE AND SCREEN
ABO/RH(D): O POS
ANTIBODY SCREEN: NEGATIVE
UNIT DIVISION: 0
Unit division: 0

## 2014-01-15 ENCOUNTER — Ambulatory Visit: Payer: Medicare Other | Admitting: Internal Medicine

## 2014-01-20 ENCOUNTER — Ambulatory Visit: Payer: Medicare Other | Admitting: Internal Medicine

## 2014-03-03 NOTE — Telephone Encounter (Signed)
Pt seen in office

## 2014-03-21 ENCOUNTER — Other Ambulatory Visit: Payer: Self-pay | Admitting: Internal Medicine

## 2014-03-27 ENCOUNTER — Emergency Department (HOSPITAL_COMMUNITY): Payer: Medicare Other

## 2014-03-27 ENCOUNTER — Inpatient Hospital Stay (HOSPITAL_COMMUNITY)
Admission: EM | Admit: 2014-03-27 | Discharge: 2014-04-01 | DRG: 432 | Disposition: A | Discharge status: Home Health | Payer: Medicare Other | Attending: Internal Medicine | Admitting: Internal Medicine

## 2014-03-27 ENCOUNTER — Encounter (HOSPITAL_COMMUNITY): Payer: Self-pay | Admitting: Emergency Medicine

## 2014-03-27 DIAGNOSIS — F17200 Nicotine dependence, unspecified, uncomplicated: Secondary | ICD-10-CM | POA: Diagnosis present

## 2014-03-27 DIAGNOSIS — I10 Essential (primary) hypertension: Secondary | ICD-10-CM | POA: Diagnosis present

## 2014-03-27 DIAGNOSIS — R609 Edema, unspecified: Secondary | ICD-10-CM | POA: Diagnosis present

## 2014-03-27 DIAGNOSIS — I472 Ventricular tachycardia, unspecified: Secondary | ICD-10-CM | POA: Diagnosis not present

## 2014-03-27 DIAGNOSIS — N179 Acute kidney failure, unspecified: Secondary | ICD-10-CM | POA: Diagnosis present

## 2014-03-27 DIAGNOSIS — Z8249 Family history of ischemic heart disease and other diseases of the circulatory system: Secondary | ICD-10-CM

## 2014-03-27 DIAGNOSIS — K7682 Hepatic encephalopathy: Secondary | ICD-10-CM | POA: Diagnosis present

## 2014-03-27 DIAGNOSIS — Z681 Body mass index (BMI) 19 or less, adult: Secondary | ICD-10-CM

## 2014-03-27 DIAGNOSIS — M6250 Muscle wasting and atrophy, not elsewhere classified, unspecified site: Secondary | ICD-10-CM | POA: Diagnosis present

## 2014-03-27 DIAGNOSIS — I509 Heart failure, unspecified: Secondary | ICD-10-CM | POA: Diagnosis present

## 2014-03-27 DIAGNOSIS — K219 Gastro-esophageal reflux disease without esophagitis: Secondary | ICD-10-CM | POA: Diagnosis present

## 2014-03-27 DIAGNOSIS — R188 Other ascites: Secondary | ICD-10-CM | POA: Diagnosis present

## 2014-03-27 DIAGNOSIS — Z79899 Other long term (current) drug therapy: Secondary | ICD-10-CM

## 2014-03-27 DIAGNOSIS — R3989 Other symptoms and signs involving the genitourinary system: Secondary | ICD-10-CM | POA: Diagnosis present

## 2014-03-27 DIAGNOSIS — I8510 Secondary esophageal varices without bleeding: Secondary | ICD-10-CM | POA: Diagnosis present

## 2014-03-27 DIAGNOSIS — E871 Hypo-osmolality and hyponatremia: Secondary | ICD-10-CM | POA: Diagnosis present

## 2014-03-27 DIAGNOSIS — T502X5A Adverse effect of carbonic-anhydrase inhibitors, benzothiadiazides and other diuretics, initial encounter: Secondary | ICD-10-CM | POA: Diagnosis present

## 2014-03-27 DIAGNOSIS — E860 Dehydration: Secondary | ICD-10-CM | POA: Diagnosis present

## 2014-03-27 DIAGNOSIS — E875 Hyperkalemia: Secondary | ICD-10-CM | POA: Diagnosis present

## 2014-03-27 DIAGNOSIS — I5042 Chronic combined systolic (congestive) and diastolic (congestive) heart failure: Secondary | ICD-10-CM | POA: Diagnosis present

## 2014-03-27 DIAGNOSIS — N289 Disorder of kidney and ureter, unspecified: Secondary | ICD-10-CM | POA: Diagnosis present

## 2014-03-27 DIAGNOSIS — S065XAA Traumatic subdural hemorrhage with loss of consciousness status unknown, initial encounter: Secondary | ICD-10-CM | POA: Diagnosis present

## 2014-03-27 DIAGNOSIS — R5381 Other malaise: Secondary | ICD-10-CM | POA: Diagnosis present

## 2014-03-27 DIAGNOSIS — I4729 Other ventricular tachycardia: Secondary | ICD-10-CM | POA: Diagnosis not present

## 2014-03-27 DIAGNOSIS — Z801 Family history of malignant neoplasm of trachea, bronchus and lung: Secondary | ICD-10-CM

## 2014-03-27 DIAGNOSIS — Z2989 Encounter for other specified prophylactic measures: Secondary | ICD-10-CM

## 2014-03-27 DIAGNOSIS — Z9861 Coronary angioplasty status: Secondary | ICD-10-CM

## 2014-03-27 DIAGNOSIS — Z418 Encounter for other procedures for purposes other than remedying health state: Secondary | ICD-10-CM

## 2014-03-27 DIAGNOSIS — I2510 Atherosclerotic heart disease of native coronary artery without angina pectoris: Secondary | ICD-10-CM | POA: Diagnosis present

## 2014-03-27 DIAGNOSIS — I2589 Other forms of chronic ischemic heart disease: Secondary | ICD-10-CM | POA: Diagnosis present

## 2014-03-27 DIAGNOSIS — R296 Repeated falls: Secondary | ICD-10-CM

## 2014-03-27 DIAGNOSIS — K7290 Hepatic failure, unspecified without coma: Secondary | ICD-10-CM | POA: Diagnosis present

## 2014-03-27 DIAGNOSIS — Z7982 Long term (current) use of aspirin: Secondary | ICD-10-CM

## 2014-03-27 DIAGNOSIS — Z9849 Cataract extraction status, unspecified eye: Secondary | ICD-10-CM

## 2014-03-27 DIAGNOSIS — K7460 Unspecified cirrhosis of liver: Principal | ICD-10-CM | POA: Diagnosis present

## 2014-03-27 DIAGNOSIS — S065X9A Traumatic subdural hemorrhage with loss of consciousness of unspecified duration, initial encounter: Secondary | ICD-10-CM | POA: Diagnosis present

## 2014-03-27 DIAGNOSIS — K7689 Other specified diseases of liver: Secondary | ICD-10-CM | POA: Diagnosis present

## 2014-03-27 DIAGNOSIS — Z951 Presence of aortocoronary bypass graft: Secondary | ICD-10-CM

## 2014-03-27 DIAGNOSIS — R7989 Other specified abnormal findings of blood chemistry: Secondary | ICD-10-CM | POA: Diagnosis present

## 2014-03-27 DIAGNOSIS — E43 Unspecified severe protein-calorie malnutrition: Secondary | ICD-10-CM | POA: Diagnosis present

## 2014-03-27 DIAGNOSIS — R5383 Other fatigue: Secondary | ICD-10-CM

## 2014-03-27 DIAGNOSIS — W19XXXA Unspecified fall, initial encounter: Secondary | ICD-10-CM | POA: Diagnosis present

## 2014-03-27 DIAGNOSIS — B029 Zoster without complications: Secondary | ICD-10-CM

## 2014-03-27 DIAGNOSIS — E785 Hyperlipidemia, unspecified: Secondary | ICD-10-CM | POA: Diagnosis present

## 2014-03-27 DIAGNOSIS — I252 Old myocardial infarction: Secondary | ICD-10-CM

## 2014-03-27 DIAGNOSIS — Z880 Allergy status to penicillin: Secondary | ICD-10-CM

## 2014-03-27 DIAGNOSIS — I6200 Nontraumatic subdural hemorrhage, unspecified: Secondary | ICD-10-CM | POA: Diagnosis present

## 2014-03-27 DIAGNOSIS — Z9181 History of falling: Secondary | ICD-10-CM

## 2014-03-27 DIAGNOSIS — R234 Changes in skin texture: Secondary | ICD-10-CM | POA: Diagnosis present

## 2014-03-27 DIAGNOSIS — Z881 Allergy status to other antibiotic agents status: Secondary | ICD-10-CM

## 2014-03-27 DIAGNOSIS — Z833 Family history of diabetes mellitus: Secondary | ICD-10-CM

## 2014-03-27 DIAGNOSIS — K769 Liver disease, unspecified: Secondary | ICD-10-CM | POA: Diagnosis present

## 2014-03-27 DIAGNOSIS — R269 Unspecified abnormalities of gait and mobility: Secondary | ICD-10-CM | POA: Diagnosis present

## 2014-03-27 HISTORY — DX: Zoster without complications: B02.9

## 2014-03-27 LAB — BASIC METABOLIC PANEL
BUN: 29 mg/dL — AB (ref 6–23)
CHLORIDE: 96 meq/L (ref 96–112)
CO2: 23 mEq/L (ref 19–32)
Calcium: 8.9 mg/dL (ref 8.4–10.5)
Creatinine, Ser: 1.45 mg/dL — ABNORMAL HIGH (ref 0.50–1.35)
GFR calc non Af Amer: 43 mL/min — ABNORMAL LOW (ref >90)
GFR, EST AFRICAN AMERICAN: 49 mL/min — AB (ref >90)
Glucose, Bld: 98 mg/dL (ref 70–99)
POTASSIUM: 5.4 meq/L — AB (ref 3.7–5.3)
Sodium: 129 mEq/L — ABNORMAL LOW (ref 137–147)

## 2014-03-27 LAB — CBC WITH DIFFERENTIAL/PLATELET
Basophils Absolute: 0 10*3/uL (ref 0.0–0.1)
Basophils Relative: 1 % (ref 0–1)
Eosinophils Absolute: 0.1 10*3/uL (ref 0.0–0.7)
Eosinophils Relative: 2 % (ref 0–5)
HCT: 25.6 % — ABNORMAL LOW (ref 39.0–52.0)
HEMOGLOBIN: 8.7 g/dL — AB (ref 13.0–17.0)
LYMPHS ABS: 0.9 10*3/uL (ref 0.7–4.0)
Lymphocytes Relative: 16 % (ref 12–46)
MCH: 30.7 pg (ref 26.0–34.0)
MCHC: 34 g/dL (ref 30.0–36.0)
MCV: 90.5 fL (ref 78.0–100.0)
MONOS PCT: 16 % — AB (ref 3–12)
Monocytes Absolute: 0.9 10*3/uL (ref 0.1–1.0)
NEUTROS PCT: 65 % (ref 43–77)
Neutro Abs: 3.8 10*3/uL (ref 1.7–7.7)
Platelets: 140 10*3/uL — ABNORMAL LOW (ref 150–400)
RBC: 2.83 MIL/uL — AB (ref 4.22–5.81)
RDW: 14.6 % (ref 11.5–15.5)
WBC: 5.7 10*3/uL (ref 4.0–10.5)

## 2014-03-27 LAB — I-STAT TROPONIN, ED: Troponin i, poc: 0.02 ng/mL (ref 0.00–0.08)

## 2014-03-27 LAB — HEPATIC FUNCTION PANEL
ALT: 21 U/L (ref 0–53)
AST: 31 U/L (ref 0–37)
Albumin: 2.4 g/dL — ABNORMAL LOW (ref 3.5–5.2)
Alkaline Phosphatase: 162 U/L — ABNORMAL HIGH (ref 39–117)
Bilirubin, Direct: 0.2 mg/dL (ref 0.0–0.3)
Indirect Bilirubin: 0.4 mg/dL (ref 0.3–0.9)
TOTAL PROTEIN: 6.6 g/dL (ref 6.0–8.3)
Total Bilirubin: 0.6 mg/dL (ref 0.3–1.2)

## 2014-03-27 LAB — AMMONIA: Ammonia: 84 umol/L — ABNORMAL HIGH (ref 11–60)

## 2014-03-27 MED ORDER — ALUM & MAG HYDROXIDE-SIMETH 200-200-20 MG/5ML PO SUSP: 30.0000 mL | Freq: Four times a day (QID) | ORAL | Status: DC | PRN

## 2014-03-27 MED ORDER — PANTOPRAZOLE SODIUM 20 MG PO TBEC
20.0000 mg | DELAYED_RELEASE_TABLET | Freq: Every day | ORAL | Status: DC
  Administered 2014-03-28 – 2014-04-01 (×5): 20 mg via ORAL
  Filled 2014-03-27 (×6): qty 1

## 2014-03-27 MED ORDER — OXYCODONE HCL 5 MG PO TABS
5.0000 mg | ORAL_TABLET | ORAL | Status: DC | PRN
  Administered 2014-03-27 – 2014-04-01 (×18): 5 mg via ORAL
  Filled 2014-03-27 (×19): qty 1

## 2014-03-27 MED ORDER — ENSURE COMPLETE PO LIQD
237.0000 mL | Freq: Three times a day (TID) | ORAL | Status: DC
  Administered 2014-03-27 – 2014-03-31 (×9): 237 mL via ORAL
  Filled 2014-03-27 (×2): qty 237

## 2014-03-27 MED ORDER — ESCITALOPRAM OXALATE 5 MG PO TABS
5.0000 mg | ORAL_TABLET | Freq: Every day | ORAL | Status: DC
  Administered 2014-03-28 – 2014-03-31 (×4): 5 mg via ORAL
  Filled 2014-03-27 (×5): qty 1

## 2014-03-27 MED ORDER — ONDANSETRON HCL 4 MG/2ML IJ SOLN
4.0000 mg | Freq: Four times a day (QID) | INTRAMUSCULAR | Status: DC | PRN
  Administered 2014-03-29: 4 mg via INTRAVENOUS
  Filled 2014-03-27: qty 2

## 2014-03-27 MED ORDER — ONDANSETRON HCL 4 MG PO TABS: 4.0000 mg | ORAL_TABLET | Freq: Four times a day (QID) | ORAL | Status: DC | PRN

## 2014-03-27 MED ORDER — VALACYCLOVIR HCL 500 MG PO TABS
1000.0000 mg | ORAL_TABLET | Freq: Three times a day (TID) | ORAL | Status: DC
  Administered 2014-03-27 – 2014-04-01 (×14): 1000 mg via ORAL
  Filled 2014-03-27 (×18): qty 2

## 2014-03-27 MED ORDER — RIFAXIMIN 550 MG PO TABS
550.0000 mg | ORAL_TABLET | Freq: Two times a day (BID) | ORAL | Status: DC
  Administered 2014-03-27 – 2014-04-01 (×10): 550 mg via ORAL
  Filled 2014-03-27 (×11): qty 1

## 2014-03-27 MED ORDER — FUROSEMIDE 40 MG PO TABS: 60.0000 mg | ORAL_TABLET | Freq: Every day | ORAL | Status: DC

## 2014-03-27 MED ORDER — CHLORHEXIDINE GLUCONATE 0.12 % MT SOLN
15.0000 mL | Freq: Two times a day (BID) | OROMUCOSAL | Status: DC
  Administered 2014-03-27 – 2014-03-31 (×7): 15 mL via OROMUCOSAL
  Filled 2014-03-27 (×10): qty 15

## 2014-03-27 MED ORDER — SODIUM CHLORIDE 0.9 % IV SOLN
Freq: Once | INTRAVENOUS | Status: AC
  Administered 2014-03-27: 15:00:00 via INTRAVENOUS

## 2014-03-27 MED ORDER — HYDRALAZINE HCL 25 MG PO TABS
25.0000 mg | ORAL_TABLET | Freq: Two times a day (BID) | ORAL | Status: DC
  Administered 2014-03-28 – 2014-04-01 (×9): 25 mg via ORAL
  Filled 2014-03-27 (×11): qty 1

## 2014-03-27 MED ORDER — BIOTENE DRY MOUTH MT LIQD
15.0000 mL | Freq: Two times a day (BID) | OROMUCOSAL | Status: DC
  Administered 2014-03-28 – 2014-03-30 (×5): 15 mL via OROMUCOSAL

## 2014-03-27 MED ORDER — FENTANYL CITRATE 0.05 MG/ML IJ SOLN
25.0000 ug | Freq: Once | INTRAMUSCULAR | Status: AC
Start: 2014-03-27 — End: 2014-03-27
  Administered 2014-03-27: 25 ug via INTRAVENOUS
  Filled 2014-03-27: qty 2

## 2014-03-27 MED ORDER — LACTULOSE 10 GM/15ML PO SOLN
20.0000 g | Freq: Two times a day (BID) | ORAL | Status: DC
  Administered 2014-03-28 – 2014-03-29 (×3): 20 g via ORAL
  Filled 2014-03-27 (×5): qty 30

## 2014-03-27 MED ORDER — ESCITALOPRAM OXALATE 10 MG PO TABS: 5.0000 mg | ORAL_TABLET | Freq: Every day | ORAL | Status: DC

## 2014-03-27 MED ORDER — SODIUM CHLORIDE 0.9 % IV SOLN
INTRAVENOUS | Status: DC
  Administered 2014-03-27 – 2014-03-28 (×2): via INTRAVENOUS

## 2014-03-27 MED ORDER — SPIRONOLACTONE 100 MG PO TABS
100.0000 mg | ORAL_TABLET | Freq: Every day | ORAL | Status: DC
  Filled 2014-03-27: qty 1

## 2014-03-27 NOTE — H&P (Signed)
PCP:   Minda Meo, MD   Chief Complaint:  Recurrent falls, left rib pain  HPI: Mr. 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 His wife reports that he has had several falls over the past several weeks. He describes some lightheadedness and dizziness when standing. He had 2 falls recently when trying to walk outdoors. This morning around 2:30 AM he fell in the bathroom his wife heard the fall. She came to his assistance and found that he was too weak to get up. EMS was called and was brought to the emergency department where he underwent an extensive evaluation with x-rays of the lumbar spine, left elbow, hand, bilateral, chest x-ray and CT of the head and cervical spine. There is no evidence of fracture but CT did reveal a chronic appearing subdural with mild midline shift.  Laboratory evaluation showed hyponatremia, hyperkalemia, and elevated BUN and creatinine. His wife states that he has had increasing doses of diuretics recently due to increased ascites and lower extremity edema.  He has generally been compliant with his lactulose but has not been having more than one bowel movement per day. His last bowel movement was 2 days ago. He does miss doses occasionally despite his wife's admonition.  Wife does not feel that he's had significant confusion. He really does not like taking his lactulose due to some nausea. They have a prescription in supply of Xifaxin at home but have not been taking it due to concern with cost.  They're willing to reconsider due to his aversion to lactulose. Over the past 2-3 weeks he has become progressively weak. This was compounded by a recent zoster outbreak on his right abdomen. This has improved with Valtrex that he started 3 days ago.   At this point, the family feels that he is too weak to  return home.  He is being admitted for dehydration, increased confusion, and gait instability.  Review of Systems:  Review of Systems - All systems reviewed with the patient and his wife and are negative as in history of present illness. Past Medical History: Past Medical History  Diagnosis Date  . GERD (gastroesophageal reflux disease)   . Diverticulosis     s/p partial colon resection  . CAD (coronary artery disease)     s/p MI 1999;  s/p CABG 2005 (L-LAD, S-Dx, S-OM1, S-PDA); patient had post op AFib;  Myoview 8/10: mild fixed lateral defect; no ischemia, EF 47%  . Hyperlipemia   . Ischemic cardiomyopathy     echo 1/11: mild MR, mild LAE, EF 40-45%;  echo 10/12: EF 40-45%, IL AK, Post AK, grade 2 diast dysfxn, mild to mod MR, mild BAE, PASP 38;   Echo 9/14:  Mild focal basal septal hypertrophy, EF 40-45%, IL, inf and post HK, Gr 1 DD, MAC, mild MR, mild LAE, mild RVE, mild RAE, PASP 32, L pleural effusion  . Hypertension   . Arthritis   . Common bile duct dilatation   . Pancreatitis     post-ERCP 11/2009  . Colitis, ischemic   . Chronic systolic heart failure     Echo 09/05/11: mild focal basal septal hypertrophy, EF 40-45%, basal to mid IL AK, post AK, grade 2 diast dysfxn, mild to mod MR, mild BAE, PASP 38  . Atrial fibrillation     post CABG in 2005 and in setting  of acute illness in 1/11 (post ERCP pancreatitis) - coumadin not felt to be indicated  . Lumbar spondylosis   . Elevated LFTs     statin d/c'd in past due to   . BPH (benign prostatic hyperplasia)   . Sinus bradycardia     on beta blockers  . Ulcer 10/2012    in pylorus  . Allergy   . Anemia   . Diverticulitis     with partial colectomy  . Cirrhosis     Negative workup for viral hepatitis, autoimmune hepatitis, hemochromatosis. Never heavy ETOH. Suspect NASH. EGD (12/13) with esophageal varices and portal hypertensive gastropathy. EGD (3/14) with 3 small esophageal varices, portal hypertensive gastropathy.  .  Myocardial infarction 1991; 1999  . Esophageal varices in cirrhosis     Hattie Perch 08/04/2013 (08/05/2013)  . Depression   . Anxiety   . Hyperkalemia requiring hospitalization 08/04/2013  . Cirrhosis    Hyperlipidemia, ASCAD/CABG, OA, HTN, MI 1999, chronic anemia lumbar spondylosis biliary ductal dilitation- ercp, ct, mrcp 2011 Radiographic evidence by CT scan of cirrhosis and small amount of ascites 2013 cad/chf cirrhosis, hepatic encephalopathy 2015 copd   Past Surgical History  Procedure Laterality Date  . Colon resection      "removed 12-15' of colon" (08/05/2013)  . Back surgery    . Ercp  11/13/2009    dilated bile ducts, atypical cytology, repeat attempt normal pancreatogram but unsuccessful biliary cannulation  . Angioplasty  1999  . Shoulder open rotator cuff repair Bilateral     "I've had a total of 3 OR's for this" (08/05/2013)  . Cataract extraction w/ intraocular lens implant Right   . Esophagogastroduodenoscopy    . Flexible sigmoidoscopy    . Colonoscopy    . Tonsillectomy    . Coronary angioplasty with stent placement      "before the bypass" (08/05/2013)  . Coronary artery bypass graft  2005    "CABG X7" (08/05/2013)x 4  . Lumbar disc surgery      CABG 2005 Cath 2005 Angioplasty 1999  Medications: Prior to Admission medications   Medication Sig Start Date End Date Taking? Authorizing Provider  aspirin EC 81 MG tablet Take 1 tablet (81 mg total) by mouth daily. 10/25/12  Yes Laurey Morale, MD  escitalopram (LEXAPRO) 10 MG tablet Take 5 mg by mouth daily.    Yes Historical Provider, MD  feeding supplement, ENSURE COMPLETE, (ENSURE COMPLETE) LIQD Take 237 mLs by mouth 3 (three) times daily between meals. 11/24/13  Yes Drema Dallas, MD  furosemide (LASIX) 20 MG tablet Take 60 mg by mouth daily.   Yes Historical Provider, MD  hydrALAZINE (APRESOLINE) 25 MG tablet Take 25 mg by mouth 2 (two) times daily.   Yes Historical Provider, MD  lactulose (CHRONULAC) 10  GM/15ML solution Take 20 g by mouth 2 (two) times daily.   Yes Historical Provider, MD  oxyCODONE (OXY IR/ROXICODONE) 5 MG immediate release tablet Take 1 tablet (5 mg total) by mouth every 4 (four) hours as needed for moderate pain. 11/24/13  Yes Drema Dallas, MD  pantoprazole (PROTONIX) 40 MG tablet TAKE 1 TABLET DAILY 30 MINUTES BEFORE A MEAL   Yes Iva Boop, MD  spironolactone (ALDACTONE) 25 MG tablet Take 4 tablets (100 mg total) by mouth daily. 11/24/13  Yes Drema Dallas, MD  valACYclovir (VALTREX) 1000 MG tablet Take 1,000 mg by mouth 3 (three) times daily. X 7 days 03/24/14  Yes Historical Provider, MD    Allergies:  Allergies  Allergen Reactions  . Erythromycin Itching and Rash    Patient allergic to all Mycins   . Penicillins Rash  . Xifaxan [Rifaximin] Rash    Social History:  reports that he has been smoking Cigars.  He has never used smokeless tobacco. He reports that he does not drink alcohol or use illicit drugs.  History of tobacco abuse,cigars. Does not drink alcohol.   Family History: Family History  Problem Relation Age of Onset  . Diabetes Mother   . Heart disease Mother   . Congestive Heart Failure Mother   . Lung cancer Father   . Colon cancer Neg Hx   . Esophageal cancer Neg Hx   . Rectal cancer Neg Hx   . Stomach cancer Neg Hx    Father D, Lung Cancer Mother D, DM, CHF   Physical Exam: Filed Vitals:   03/27/14 1122 03/27/14 1330 03/27/14 1400 03/27/14 1636  BP: 122/58 144/71 113/58 135/64  Pulse: 74 78 70 83  Temp: 98.7 F (37.1 C)     TempSrc: Oral     Resp: 18   20  SpO2: 98% 97% 98% 98%   General appearance: Frail, cachectic, mildly lethargic, drifting off to sleep during the exam Head: Normocephalic, without obvious abnormality, atraumatic Eyes: conjunctivae/corneas clear. PERRL, EOM's intact.  Nose: Nares normal. Septum midline. Mucosa normal. No drainage or sinus tenderness. Throat: lips, mucosa, and tongue normal; teeth and  gums normal Neck: no adenopathy, no carotid bruit, no JVD and thyroid not enlarged, symmetric, no tenderness/mass/nodules Resp: clear to auscultation bilaterally Cardio: regular rate and rhythm GI: soft, slightly distended with normoactive bowel sounds; minimal left upper quadrant/lower rib tenderness; Moderate ascites Extremities: extremities normal, atraumatic, no cyanosis; trace edema Pulses: 2+ and symmetric Lymph nodes:no cervical lymphadenopathy Neurologic: Slightly disoriented but cooperative and appropriate; positive asterixis; motor strength 5- out of 5 in all extremities with no focal weakness; finger to nose is slightly Hx   Labs on Admission:   Recent Labs  03/27/14 1150  NA 129*  K 5.4*  CL 96  CO2 23  GLUCOSE 98  BUN 29*  CREATININE 1.45*  CALCIUM 8.9     Recent Labs  03/27/14 1150  WBC 5.7  NEUTROABS 3.8  HGB 8.7*  HCT 25.6*  MCV 90.5  PLT 140*   No results found for this basename: CKTOTAL, CKMB, CKMBINDEX, TROPONINI,  in the last 72 hours Lab Results  Component Value Date   INR 1.30 11/22/2013   INR 1.27 11/21/2013   INR 1.3* 09/24/2013   No results found for this basename: TSH, T4TOTAL, FREET3, T3FREE, THYROIDAB,  in the last 72 hours No results found for this basename: VITAMINB12, FOLATE, FERRITIN, TIBC, IRON, RETICCTPCT,  in the last 72 hours  Radiological Exams on Admission: Dg Chest 2 View  03/27/2014   CLINICAL DATA:  Fall, bilateral hip pain  EXAM: CHEST  2 VIEW  COMPARISON:  11/21/2013  FINDINGS: Cardiomediastinal silhouette is stable. Status post median sternotomy. No acute infiltrate or pleural effusion. No pulmonary edema. Mild degenerative changes thoracic spine.  IMPRESSION: No active cardiopulmonary disease.   Electronically Signed   By: Natasha MeadLiviu  Pop M.D.   On: 03/27/2014 13:16   Dg Lumbar Spine Complete  03/27/2014   CLINICAL DATA:  Status post fall now with low back and left hip pain. ; history of previous lumbar disk surgery  EXAM:  LUMBAR SPINE - COMPLETE 4+ VIEW  COMPARISON:  None.  FINDINGS: The lumbar vertebral bodies are  preserved in height. The intervertebral disc space heights are well maintained. The patient has undergone laminectomy at L4 and possibly at L5. There is no spondylolisthesis. There is mild facet joint degenerative change in the mid and lower lumbar spine. The pedicles are intact. The transverse processes cannot be adequately assessed due to osteopenia and overlying stool and gas. There is mild degenerative change of the SI joints.  IMPRESSION: No acute abnormality of the lumbar spine is demonstrated. Mild degenerative changes are present.   Electronically Signed   By: David  [place]   On: 03/27/2014 13:25   Dg Elbow Complete Left  03/27/2014   CLINICAL DATA:  Pain post trauma  EXAM: LEFT ELBOW - COMPLETE 3+ VIEW  COMPARISON:  None.  FINDINGS: Frontal, lateral, and bilateral oblique views were obtained. There is no fracture, dislocation, or effusion. There is no appreciable joint space narrowing. There is a small exostosis arising from the distal lateral humeral metaphysis. Small calcifications in the volar aspect of the joint are probably of arthropathic etiology. No erosive change.  IMPRESSION: No fracture, dislocation, or effusion.   Electronically Signed   By: Bretta Bang M.D.   On: 03/27/2014 13:13   Dg Hip Complete Left  03/27/2014   CLINICAL DATA:  Fall, bilateral left greater than right hip pain  EXAM: LEFT HIP - COMPLETE 2+ VIEW  COMPARISON:  None.  FINDINGS: There is no evidence of hip fracture or dislocation. There is no evidence of arthropathy or other focal bone abnormality. Vascular calcifications are noted. Small amount of stool projects over the rectum, accounting for apparent lucency over the right symphysis pubis.  IMPRESSION: Negative.   Electronically Signed   By: Christiana Pellant M.D.   On: 03/27/2014 13:20   Dg Hip Complete Right  03/27/2014   CLINICAL DATA:  Right hip pain, fall  EXAM:  RIGHT HIP - COMPLETE 2+ VIEW  COMPARISON:  None.  FINDINGS: There is no evidence of hip fracture or dislocation. There is no evidence of arthropathy or other focal bone abnormality. Vascular calcifications are noted.  IMPRESSION: Negative.   Electronically Signed   By: Christiana Pellant M.D.   On: 03/27/2014 13:21   Ct Head Wo Contrast  03/27/2014   CLINICAL DATA:  Pain post trauma  EXAM: CT HEAD WITHOUT CONTRAST  CT CERVICAL SPINE WITHOUT CONTRAST  TECHNIQUE: Multidetector CT imaging of the head and cervical spine was performed following the standard protocol without intravenous contrast. Multiplanar CT image reconstructions of the cervical spine were also generated.  COMPARISON:  Chest CT November 21, 2013  FINDINGS: CT HEAD FINDINGS  The ventricles are normal in size and configuration. There is a subdural hematoma on the left which shows decreased attenuation, suggesting chronicity. These subdural hematoma on the left has a maximum thickness of 1.2 cm. There is impression on the left frontal, left superior temporal, and left parietal lobes. There is modest midline shift toward the right of approximately 4 mm. There is no intra-axial hemorrhage. There is mild patchy small vessel disease in the centra semiovale bilaterally. No acute appearing infarct is seen. Bony calvarium appears intact. The mastoid air cells are clear.  CT CERVICAL SPINE FINDINGS  There is no fracture or spondylolisthesis. Prevertebral soft tissues and predental space regions are normal. There is moderate disc space narrowing at C5-6. There is mild disc space narrowing at C6-7. There is facet hypertrophy at multiple levels bilaterally. There is no disc extrusion or stenosis. There is calcification in both carotid arteries.  IMPRESSION: CT head: Subdural hematoma on the left which appears chronic based on the low attenuation of the fluid. There is mass effect and slight midline shift toward the right. There is patchy periventricular small vessel  disease without evidence of acute infarct. No acute appearing hemorrhage is seen. No intra-axial hemorrhage seen.  CT cervical spine: No fracture or spondylolisthesis. Osteoarthritic change. No disc extrusion or stenosis. Calcification in both carotid arteries.  Critical Value/emergent results were called by telephone at the time of interpretation on 03/27/2014 at 12:41 PM to Dr. Roxy Horseman , who verbally acknowledged these results.   Electronically Signed   By: Bretta Bang M.D.   On: 03/27/2014 12:43   Ct Cervical Spine Wo Contrast  03/27/2014   CLINICAL DATA:  Pain post trauma  EXAM: CT HEAD WITHOUT CONTRAST  CT CERVICAL SPINE WITHOUT CONTRAST  TECHNIQUE: Multidetector CT imaging of the head and cervical spine was performed following the standard protocol without intravenous contrast. Multiplanar CT image reconstructions of the cervical spine were also generated.  COMPARISON:  Chest CT November 21, 2013  FINDINGS: CT HEAD FINDINGS  The ventricles are normal in size and configuration. There is a subdural hematoma on the left which shows decreased attenuation, suggesting chronicity. These subdural hematoma on the left has a maximum thickness of 1.2 cm. There is impression on the left frontal, left superior temporal, and left parietal lobes. There is modest midline shift toward the right of approximately 4 mm. There is no intra-axial hemorrhage. There is mild patchy small vessel disease in the centra semiovale bilaterally. No acute appearing infarct is seen. Bony calvarium appears intact. The mastoid air cells are clear.  CT CERVICAL SPINE FINDINGS  There is no fracture or spondylolisthesis. Prevertebral soft tissues and predental space regions are normal. There is moderate disc space narrowing at C5-6. There is mild disc space narrowing at C6-7. There is facet hypertrophy at multiple levels bilaterally. There is no disc extrusion or stenosis. There is calcification in both carotid arteries.  IMPRESSION:  CT head: Subdural hematoma on the left which appears chronic based on the low attenuation of the fluid. There is mass effect and slight midline shift toward the right. There is patchy periventricular small vessel disease without evidence of acute infarct. No acute appearing hemorrhage is seen. No intra-axial hemorrhage seen.  CT cervical spine: No fracture or spondylolisthesis. Osteoarthritic change. No disc extrusion or stenosis. Calcification in both carotid arteries.  Critical Value/emergent results were called by telephone at the time of interpretation on 03/27/2014 at 12:41 PM to Dr. Roxy Horseman , who verbally acknowledged these results.   Electronically Signed   By: Bretta Bang M.D.   On: 03/27/2014 12:43   Dg Hand Complete Left  03/27/2014   CLINICAL DATA:  Pain post trauma  EXAM: LEFT HAND - COMPLETE 3+ VIEW  COMPARISON:  None.  FINDINGS: Frontal, oblique, and lateral views were obtained. There is a small metallic foreign body in the soft tissues adjacent to the first metacarpal along the lateral aspect. There is evidence of an old fracture along the proximal first metacarpal which shows remodeling. There is no demonstrable acute fracture or dislocation. There is osteoarthritic change in all MCP, PIP, and DIP joints. There is also narrowing of the radiocarpal joint. No erosive change.  IMPRESSION: Small metallic foreign body slightly lateral to the proximal first metacarpal. Evidence of an old healed fracture of the first metacarpal. There is multilevel osteoarthritic change. No acute fracture apparent. No dislocation.  Electronically Signed   By: Bretta Bang M.D.   On: 03/27/2014 13:18   Orders placed during the hospital encounter of 03/27/14  . ED EKG  . ED EKG  . EKG 12-LEAD  . EKG 12-LEAD    Assessment/Plan Active Problems: 1.  Recurrent falls/Unsteady gait- multifactorial secondary to generalized weakness, orthostasis set in the setting of volume depletion secondary to  diuretic therapy, and hepatic encephalopathy. Will admit for gentle IV hydration, hold diuretics, and treat encephalopathy. PT/OT evaluation. Check orthostatics. 2. Cirrhosis of liver without mention of alcohol with Hepatic encephalopathy- he does have asterixis and increased lethargy consistent with encephalopathy. Check ammonia level. We discussed treatment options.  It sounds like he is compliant with lactulose though not having the desired effect of 3 soft bowel movements per day. Would like to try Xifaxan to see if it helps with his mental status and generalized weakness. His wife does not feel the cost is prohibitive if it does help him. 3. Ascites-we'll need to monitor for worsening ascites with fluid hydration and holding diuretics. Anticipate need to restart lower dose diuretics once volume is stable. 4. Hyponatremia-monitor with hydration and withholding diuretics.  5. Chronic combined systolic and diastolic CHF (congestive heart failure)- EF 40-45%-monitor volume status with fluid hydration for any signs of volume overload 6. Acute renal insufficiency-secondary to prerenal azotemia. Monitor with hydration. 7. Protein-calorie malnutrition, severe-encouraged by mouth intake. Add Ensure. 8. Dehydration-IV fluid hydration. Hold diuretics. 9. 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 Hold any anticoagulants. Neurosurgery has reviewed and did not recommend any surgical intervention. Consider repeat CT of mental status declines. Otherwise, consider outpatient followup. 10. Herpes zoster-he is on respiratory isolation. Continue Valtrex. 11.Disposition-anticipate discharge to home with home health services in 2-3 days if stable. 12. Ethics- discussed with his wife. She would like for him to remain full code for now. Urged him to continue to discuss goals of therapy as his condition evolves.  Martha Clan 03/27/2014, 5:43 PM

## 2014-03-27 NOTE — Progress Notes (Signed)
Unable to perform orthostatic vital signs at this time due to extremem pain. Patient having pain making even the slightest movements. Will give pain medication and allow patient to rest and try to obtain orthostatic vital signs in the AM. Will continue to monitor patient.

## 2014-03-27 NOTE — ED Notes (Signed)
Pt still in X ray

## 2014-03-27 NOTE — ED Notes (Signed)
PA at bedside.

## 2014-03-27 NOTE — ED Notes (Signed)
PA Rob at bedside 

## 2014-03-27 NOTE — ED Provider Notes (Signed)
CSN: [id number]     Arrival date & time 03/27/14  1115 History   First MD Initiated Contact with Patient 03/27/14 1125     Chief Complaint  Patient presents with  . Fall     (Consider location/radiation/quality/duration/timing/severity/associated sxs/prior Treatment) HPI Comments: Patient presents emergency department with chief complaint of fall. He states that when he was using the restroom in the middle of night, he fell over backwards. He states that he did not lose consciousness. He denies hitting his head. He complains of pain to his left elbow, and left hand. He states that he has had several falls in the past few months.  Currently, denies dizziness/lightheadedness.  Additionally, patient complains of shingles. He is currently being treated for these. He has been taking valacyclovir, prescribed by his PCP.  The history is provided by the patient. No language interpreter was used.    Past Medical History  Diagnosis Date  . GERD (gastroesophageal reflux disease)   . Diverticulosis     s/p partial colon resection  . CAD (coronary artery disease)     s/p MI 1999;  s/p CABG 2005 (L-LAD, S-Dx, S-OM1, S-PDA); patient had post op AFib;  Myoview 8/10: mild fixed lateral defect; no ischemia, EF 47%  . Hyperlipemia   . Ischemic cardiomyopathy     echo 1/11: mild MR, mild LAE, EF 40-45%;  echo 10/12: EF 40-45%, IL AK, Post AK, grade 2 diast dysfxn, mild to mod MR, mild BAE, PASP 38;   Echo 9/14:  Mild focal basal septal hypertrophy, EF 40-45%, IL, inf and post HK, Gr 1 DD, MAC, mild MR, mild LAE, mild RVE, mild RAE, PASP 32, L pleural effusion  . Hypertension   . Arthritis   . Common bile duct dilatation   . Pancreatitis     post-ERCP 11/2009  . Colitis, ischemic   . Chronic systolic heart failure     Echo 09/05/11: mild focal basal septal hypertrophy, EF 40-45%, basal to mid IL AK, post AK, grade 2 diast dysfxn, mild to mod MR, mild BAE, PASP 38  . Atrial fibrillation     post CABG in  2005 and in setting of acute illness in 1/11 (post ERCP pancreatitis) - coumadin not felt to be indicated  . Lumbar spondylosis   . Elevated LFTs     statin d/c'd in past due to   . BPH (benign prostatic hyperplasia)   . Sinus bradycardia     on beta blockers  . Ulcer 10/2012    in pylorus  . Allergy   . Anemia   . Diverticulitis     with partial colectomy  . Cirrhosis     Negative workup for viral hepatitis, autoimmune hepatitis, hemochromatosis. Never heavy ETOH. Suspect NASH. EGD (12/13) with esophageal varices and portal hypertensive gastropathy. EGD (3/14) with 3 small esophageal varices, portal hypertensive gastropathy.  . Myocardial infarction 1991; 1999  . Esophageal varices in cirrhosis     Hattie Perch/notes 08/04/2013 (08/05/2013)  . Depression   . Anxiety   . Hyperkalemia requiring hospitalization 08/04/2013  . Cirrhosis    Past Surgical History  Procedure Laterality Date  . Colon resection      "removed 12-15' of colon" (08/05/2013)  . Back surgery    . Ercp  11/13/2009    dilated bile ducts, atypical cytology, repeat attempt normal pancreatogram but unsuccessful biliary cannulation  . Angioplasty  1999  . Shoulder open rotator cuff repair Bilateral     "I've had a total of  3 OR's for this" (08/05/2013)  . Cataract extraction w/ intraocular lens implant Right   . Esophagogastroduodenoscopy    . Flexible sigmoidoscopy    . Colonoscopy    . Tonsillectomy    . Coronary angioplasty with stent placement      "before the bypass" (08/05/2013)  . Coronary artery bypass graft  2005    "CABG X7" (08/05/2013)x 4  . Lumbar disc surgery     Family History  Problem Relation Age of Onset  . Diabetes Mother   . Heart disease Mother   . Congestive Heart Failure Mother   . Lung cancer Father   . Colon cancer Neg Hx   . Esophageal cancer Neg Hx   . Rectal cancer Neg Hx   . Stomach cancer Neg Hx    History  Substance Use Topics  . Smoking status: Current Every Day Smoker -- 68 years     Types: Cigars  . Smokeless tobacco: Never Used     Comment: 08/05/2013 "probably 3 cigars/day"; form given 10-08-13  . Alcohol Use: No    Review of Systems  Constitutional: Negative for fever and chills.  Respiratory: Negative for shortness of breath.   Cardiovascular: Negative for chest pain.  Gastrointestinal: Negative for nausea, vomiting, diarrhea and constipation.  Genitourinary: Negative for dysuria.  Musculoskeletal: Positive for arthralgias, back pain and myalgias.  Skin: Positive for rash.      Allergies  Erythromycin; Penicillins; and Xifaxan  Home Medications   Prior to Admission medications   Medication Sig Start Date End Date Taking? Authorizing Provider  aspirin EC 81 MG tablet Take 1 tablet (81 mg total) by mouth daily. 10/25/12  Yes Laurey Morale, MD  escitalopram (LEXAPRO) 10 MG tablet Take 5 mg by mouth daily.    Yes Historical Provider, MD  feeding supplement, ENSURE COMPLETE, (ENSURE COMPLETE) LIQD Take 237 mLs by mouth 3 (three) times daily between meals. 11/24/13  Yes Drema Dallas, MD  furosemide (LASIX) 20 MG tablet Take 60 mg by mouth daily.   Yes Historical Provider, MD  hydrALAZINE (APRESOLINE) 25 MG tablet Take 25 mg by mouth 2 (two) times daily.   Yes Historical Provider, MD  lactulose (CHRONULAC) 10 GM/15ML solution Take 20 g by mouth 2 (two) times daily.   Yes Historical Provider, MD  oxyCODONE (OXY IR/ROXICODONE) 5 MG immediate release tablet Take 1 tablet (5 mg total) by mouth every 4 (four) hours as needed for moderate pain. 11/24/13  Yes Drema Dallas, MD  pantoprazole (PROTONIX) 40 MG tablet TAKE 1 TABLET DAILY 30 MINUTES BEFORE A MEAL   Yes Iva Boop, MD  spironolactone (ALDACTONE) 25 MG tablet Take 4 tablets (100 mg total) by mouth daily. 11/24/13  Yes Drema Dallas, MD  valACYclovir (VALTREX) 1000 MG tablet Take 1,000 mg by mouth 3 (three) times daily. X 7 days 03/24/14  Yes Historical Provider, MD   BP 122/58  Pulse 74  Temp(Src) 98.7  F (37.1 C) (Oral)  Resp 18  SpO2 98% Physical Exam  Nursing note and vitals reviewed. Constitutional: He is oriented to person, place, and time. He appears well-developed and well-nourished.  HENT:  Head: Normocephalic and atraumatic.  No scalp hematoma, no obvious sign of head injury  Eyes: Conjunctivae and EOM are normal. Pupils are equal, round, and reactive to light. Right eye exhibits no discharge. Left eye exhibits no discharge. No scleral icterus.  Neck: Normal range of motion. Neck supple. No JVD present.  Cardiovascular: Normal rate,  regular rhythm and normal heart sounds.  Exam reveals no gallop and no friction rub.   No murmur heard. Pulmonary/Chest: Effort normal and breath sounds normal. No respiratory distress. He has no wheezes. He has no rales. He exhibits no tenderness.  Left-sided rib tenderness to palpation, no bruising Clear to auscultation  Abdominal: Soft. He exhibits no distension and no mass. There is no tenderness. There is no rebound and no guarding.  Musculoskeletal: Normal range of motion. He exhibits no edema and no tenderness.  Moves all extremities, and is able to ambulate very slowly Tender to palpation over left elbow, and left hand Tender palpation over lumbar spine, no other spine tenderness, no bony step-offs or deformities  Neurological: He is alert and oriented to person, place, and time.  CN 3-12 intact, sensation and strength intact throughout  Skin: Skin is warm and dry.  Shingles to the right abdomen T11 distribution Multiple skin tears to left elbow, left hand, right elbow, no lacerations  Psychiatric: He has a normal mood and affect. His behavior is normal. Judgment and thought content normal.    ED Course  Procedures (including critical care time) Results for orders placed during the hospital encounter of 03/27/14  CBC WITH DIFFERENTIAL      Result Value Ref Range   WBC 5.7  4.0 - 10.5 K/uL   RBC 2.83 (*) 4.22 - 5.81 MIL/uL    Hemoglobin 8.7 (*) 13.0 - 17.0 g/dL   HCT 95.2 (*) 84.1 - 32.4 %   MCV 90.5  78.0 - 100.0 fL   MCH 30.7  26.0 - 34.0 pg   MCHC 34.0  30.0 - 36.0 g/dL   RDW 40.1  02.7 - 25.3 %   Platelets 140 (*) 150 - 400 K/uL   Neutrophils Relative % 65  43 - 77 %   Neutro Abs 3.8  1.7 - 7.7 K/uL   Lymphocytes Relative 16  12 - 46 %   Lymphs Abs 0.9  0.7 - 4.0 K/uL   Monocytes Relative 16 (*) 3 - 12 %   Monocytes Absolute 0.9  0.1 - 1.0 K/uL   Eosinophils Relative 2  0 - 5 %   Eosinophils Absolute 0.1  0.0 - 0.7 K/uL   Basophils Relative 1  0 - 1 %   Basophils Absolute 0.0  0.0 - 0.1 K/uL  BASIC METABOLIC PANEL      Result Value Ref Range   Sodium 129 (*) 137 - 147 mEq/L   Potassium 5.4 (*) 3.7 - 5.3 mEq/L   Chloride 96  96 - 112 mEq/L   CO2 23  19 - 32 mEq/L   Glucose, Bld 98  70 - 99 mg/dL   BUN 29 (*) 6 - 23 mg/dL   Creatinine, Ser 6.64 (*) 0.50 - 1.35 mg/dL   Calcium 8.9  8.4 - 40.3 mg/dL   GFR calc non Af Amer 43 (*) >90 mL/min   GFR calc Af Amer 49 (*) >90 mL/min  I-STAT TROPOININ, ED      Result Value Ref Range   Troponin i, poc 0.02  0.00 - 0.08 ng/mL   Comment 3            Dg Chest 2 View  03/27/2014   CLINICAL DATA:  Fall, bilateral hip pain  EXAM: CHEST  2 VIEW  COMPARISON:  11/21/2013  FINDINGS: Cardiomediastinal silhouette is stable. Status post median sternotomy. No acute infiltrate or pleural effusion. No pulmonary edema. Mild degenerative changes thoracic spine.  IMPRESSION: No active cardiopulmonary disease.   Electronically Signed   By: Natasha Mead M.D.   On: 03/27/2014 13:16   Dg Lumbar Spine Complete  03/27/2014   CLINICAL DATA:  Status post fall now with low back and left hip pain. ; history of previous lumbar disk surgery  EXAM: LUMBAR SPINE - COMPLETE 4+ VIEW  COMPARISON:  None.  FINDINGS: The lumbar vertebral bodies are preserved in height. The intervertebral disc space heights are well maintained. The patient has undergone laminectomy at L4 and possibly at L5. There is  no spondylolisthesis. There is mild facet joint degenerative change in the mid and lower lumbar spine. The pedicles are intact. The transverse processes cannot be adequately assessed due to osteopenia and overlying stool and gas. There is mild degenerative change of the SI joints.  IMPRESSION: No acute abnormality of the lumbar spine is demonstrated. Mild degenerative changes are present.   Electronically Signed   By: David  [place]   On: 03/27/2014 13:25   Dg Elbow Complete Left  03/27/2014   CLINICAL DATA:  Pain post trauma  EXAM: LEFT ELBOW - COMPLETE 3+ VIEW  COMPARISON:  None.  FINDINGS: Frontal, lateral, and bilateral oblique views were obtained. There is no fracture, dislocation, or effusion. There is no appreciable joint space narrowing. There is a small exostosis arising from the distal lateral humeral metaphysis. Small calcifications in the volar aspect of the joint are probably of arthropathic etiology. No erosive change.  IMPRESSION: No fracture, dislocation, or effusion.   Electronically Signed   By: Bretta Bang M.D.   On: 03/27/2014 13:13   Dg Hip Complete Left  03/27/2014   CLINICAL DATA:  Fall, bilateral left greater than right hip pain  EXAM: LEFT HIP - COMPLETE 2+ VIEW  COMPARISON:  None.  FINDINGS: There is no evidence of hip fracture or dislocation. There is no evidence of arthropathy or other focal bone abnormality. Vascular calcifications are noted. Small amount of stool projects over the rectum, accounting for apparent lucency over the right symphysis pubis.  IMPRESSION: Negative.   Electronically Signed   By: Christiana Pellant M.D.   On: 03/27/2014 13:20   Dg Hip Complete Right  03/27/2014   CLINICAL DATA:  Right hip pain, fall  EXAM: RIGHT HIP - COMPLETE 2+ VIEW  COMPARISON:  None.  FINDINGS: There is no evidence of hip fracture or dislocation. There is no evidence of arthropathy or other focal bone abnormality. Vascular calcifications are noted.  IMPRESSION: Negative.    Electronically Signed   By: Christiana Pellant M.D.   On: 03/27/2014 13:21   Ct Head Wo Contrast  03/27/2014   CLINICAL DATA:  Pain post trauma  EXAM: CT HEAD WITHOUT CONTRAST  CT CERVICAL SPINE WITHOUT CONTRAST  TECHNIQUE: Multidetector CT imaging of the head and cervical spine was performed following the standard protocol without intravenous contrast. Multiplanar CT image reconstructions of the cervical spine were also generated.  COMPARISON:  Chest CT November 21, 2013  FINDINGS: CT HEAD FINDINGS  The ventricles are normal in size and configuration. There is a subdural hematoma on the left which shows decreased attenuation, suggesting chronicity. These subdural hematoma on the left has a maximum thickness of 1.2 cm. There is impression on the left frontal, left superior temporal, and left parietal lobes. There is modest midline shift toward the right of approximately 4 mm. There is no intra-axial hemorrhage. There is mild patchy small vessel disease in the centra semiovale bilaterally. No acute appearing infarct  is seen. Bony calvarium appears intact. The mastoid air cells are clear.  CT CERVICAL SPINE FINDINGS  There is no fracture or spondylolisthesis. Prevertebral soft tissues and predental space regions are normal. There is moderate disc space narrowing at C5-6. There is mild disc space narrowing at C6-7. There is facet hypertrophy at multiple levels bilaterally. There is no disc extrusion or stenosis. There is calcification in both carotid arteries.  IMPRESSION: CT head: Subdural hematoma on the left which appears chronic based on the low attenuation of the fluid. There is mass effect and slight midline shift toward the right. There is patchy periventricular small vessel disease without evidence of acute infarct. No acute appearing hemorrhage is seen. No intra-axial hemorrhage seen.  CT cervical spine: No fracture or spondylolisthesis. Osteoarthritic change. No disc extrusion or stenosis. Calcification in  both carotid arteries.  Critical Value/emergent results were called by telephone at the time of interpretation on 03/27/2014 at 12:41 PM to Dr. Roxy Horseman , who verbally acknowledged these results.   Electronically Signed   By: Bretta Bang M.D.   On: 03/27/2014 12:43   Ct Cervical Spine Wo Contrast  03/27/2014   CLINICAL DATA:  Pain post trauma  EXAM: CT HEAD WITHOUT CONTRAST  CT CERVICAL SPINE WITHOUT CONTRAST  TECHNIQUE: Multidetector CT imaging of the head and cervical spine was performed following the standard protocol without intravenous contrast. Multiplanar CT image reconstructions of the cervical spine were also generated.  COMPARISON:  Chest CT November 21, 2013  FINDINGS: CT HEAD FINDINGS  The ventricles are normal in size and configuration. There is a subdural hematoma on the left which shows decreased attenuation, suggesting chronicity. These subdural hematoma on the left has a maximum thickness of 1.2 cm. There is impression on the left frontal, left superior temporal, and left parietal lobes. There is modest midline shift toward the right of approximately 4 mm. There is no intra-axial hemorrhage. There is mild patchy small vessel disease in the centra semiovale bilaterally. No acute appearing infarct is seen. Bony calvarium appears intact. The mastoid air cells are clear.  CT CERVICAL SPINE FINDINGS  There is no fracture or spondylolisthesis. Prevertebral soft tissues and predental space regions are normal. There is moderate disc space narrowing at C5-6. There is mild disc space narrowing at C6-7. There is facet hypertrophy at multiple levels bilaterally. There is no disc extrusion or stenosis. There is calcification in both carotid arteries.  IMPRESSION: CT head: Subdural hematoma on the left which appears chronic based on the low attenuation of the fluid. There is mass effect and slight midline shift toward the right. There is patchy periventricular small vessel disease without evidence  of acute infarct. No acute appearing hemorrhage is seen. No intra-axial hemorrhage seen.  CT cervical spine: No fracture or spondylolisthesis. Osteoarthritic change. No disc extrusion or stenosis. Calcification in both carotid arteries.  Critical Value/emergent results were called by telephone at the time of interpretation on 03/27/2014 at 12:41 PM to Dr. Roxy Horseman , who verbally acknowledged these results.   Electronically Signed   By: Bretta Bang M.D.   On: 03/27/2014 12:43   Dg Hand Complete Left  03/27/2014   CLINICAL DATA:  Pain post trauma  EXAM: LEFT HAND - COMPLETE 3+ VIEW  COMPARISON:  None.  FINDINGS: Frontal, oblique, and lateral views were obtained. There is a small metallic foreign body in the soft tissues adjacent to the first metacarpal along the lateral aspect. There is evidence of an old fracture along the  proximal first metacarpal which shows remodeling. There is no demonstrable acute fracture or dislocation. There is osteoarthritic change in all MCP, PIP, and DIP joints. There is also narrowing of the radiocarpal joint. No erosive change.  IMPRESSION: Small metallic foreign body slightly lateral to the proximal first metacarpal. Evidence of an old healed fracture of the first metacarpal. There is multilevel osteoarthritic change. No acute fracture apparent. No dislocation.   Electronically Signed   By: Bretta Bang M.D.   On: 03/27/2014 13:18     Imaging Review No results found.   EKG Interpretation   Date/Time:  Friday Mar 27 2014 11:49:57 EDT Ventricular Rate:  67 PR Interval:  148 QRS Duration: 104 QT Interval:  445 QTC Calculation: 470 R Axis:   22 Text Interpretation:  Sinus rhythm similar to prior Confirmed by HORTON   MD, Toni Amend ([id number]) on 03/27/2014 11:55:36 AM      MDM   Final diagnoses:  Fall  Dehydration  Subdural hematoma    Patient with fall today, suspect mechanical, this patient has been losing his balance frequently, however will do  fairly thorough workup. Imaging of the affected areas, basic labs, and will reassess. Patient does have active shingles, he is being treated for valacyclovir. He is on contact precautions.  1:04 PM Discussed the patient with Dr. Lovell Sheehan from neurosurgery, who says that the patient can follow-up with him in the office regarding the subdural hematoma in 7-10 days.  Recommends holding aspirin. Patient is pleasant and neurovascularly intact.  2:10 PM Discussed the patient with Dr. Clelia Croft, who will admit the patient.  Roxy Horseman, PA-C 03/27/14 1424  Roxy Horseman, PA-C 03/27/14 [phone]

## 2014-03-27 NOTE — ED Notes (Addendum)
Pt had fall today, went to restroom, got clothes on, went to stand and fell backwards. Pt denies feeling dizziness, lightheaded mess, LOC. Pt denies hitting head. Has skin tears on left arm.  Pt has shingles, wife states some are scabbed over but thinks some are open.

## 2014-03-27 NOTE — ED Notes (Signed)
MD Horton at bedside. 

## 2014-03-27 NOTE — ED Notes (Addendum)
Attempted to call report to floor. RN will call back they may change room assignment d/t shingles DX and need of negative pressure room.

## 2014-03-27 NOTE — ED Notes (Signed)
Pt transported to XRAY °

## 2014-03-28 ENCOUNTER — Inpatient Hospital Stay (HOSPITAL_COMMUNITY): Payer: Medicare Other

## 2014-03-28 DIAGNOSIS — E871 Hypo-osmolality and hyponatremia: Secondary | ICD-10-CM | POA: Diagnosis present

## 2014-03-28 LAB — BASIC METABOLIC PANEL
BUN: 25 mg/dL — AB (ref 6–23)
CALCIUM: 8.4 mg/dL (ref 8.4–10.5)
CO2: 24 meq/L (ref 19–32)
Chloride: 100 mEq/L (ref 96–112)
Creatinine, Ser: 1.19 mg/dL (ref 0.50–1.35)
GFR calc Af Amer: 63 mL/min — ABNORMAL LOW (ref >90)
GFR calc non Af Amer: 54 mL/min — ABNORMAL LOW (ref >90)
GLUCOSE: 91 mg/dL (ref 70–99)
Potassium: 5.1 mEq/L (ref 3.7–5.3)
SODIUM: 132 meq/L — AB (ref 137–147)

## 2014-03-28 LAB — CBC
HCT: 23.4 % — ABNORMAL LOW (ref 39.0–52.0)
Hemoglobin: 8.2 g/dL — ABNORMAL LOW (ref 13.0–17.0)
MCH: 31.7 pg (ref 26.0–34.0)
MCHC: 35 g/dL (ref 30.0–36.0)
MCV: 90.3 fL (ref 78.0–100.0)
PLATELETS: 117 10*3/uL — AB (ref 150–400)
RBC: 2.59 MIL/uL — AB (ref 4.22–5.81)
RDW: 14.6 % (ref 11.5–15.5)
WBC: 5.7 10*3/uL (ref 4.0–10.5)

## 2014-03-28 NOTE — Progress Notes (Signed)
PT Cancellation Note  Patient Details Name: Kirk [name] MRN: [id number] DOB: 02-15-1927   Cancelled Treatment:    Reason Eval/Treat Not Completed: Other (comment);Pain limiting ability to participate    Pt has had thoracic spine and rib series xrays but reports not available yet, will defer PT at this time and check back tomorrow;    [name] 03/28/2014, 11:48 AM

## 2014-03-28 NOTE — Progress Notes (Signed)
Attempted to obtain orthostatic vital signs again this morning and patient unable to sit up or stand due to 9/10 pain. Also noticed that patient had developed swelling in his groin overnight. Dr. Clelia Croft made aware of both. Will continue to monitor patient.

## 2014-03-28 NOTE — Progress Notes (Signed)
Subjective: Overall feels better.  Complains of left lower rib pain and left upper abdominal pain.  Worse with movement.    Objective: Vital signs in last 24 hours: Temp:  [98.5 F (36.9 C)-98.7 F (37.1 C)] 98.5 F (36.9 C) (05/22 2257) Pulse Rate:  [70-83] 77 (05/22 2257) Resp:  [18-20] 20 (05/22 2257) BP: (113-144)/(53-72) 120/53 mmHg (05/22 2257) SpO2:  [95 %-98 %] 95 % (05/22 2257) Weight:  [55.8 kg (123 lb 0.3 oz)] 55.8 kg (123 lb 0.3 oz) (05/22 2037) Weight change:  Last BM Date: 03/25/14  CBG (last 3)  No results found for this basename: GLUCAP,  in the last 72 hours  Intake/Output from previous day: 05/22 0701 - 05/23 0700 In: 201.3 [I.V.:201.3] Out: 150 [Urine:150] Intake/Output this shift: Total I/O In: 201.3 [I.V.:201.3] Out: -   General appearance: more alert and oriented; in obvious discomfort with movement Eyes: no scleral icterus Neck:mild JVD Throat: oropharynx moist without erythema Resp: clear to auscultation bilaterally Cardio: regular rate and rhythm and grade 2/6 HSM apex NW:[id number] ascites; left upper quadrant tenderness; no rebound or guarding in rest of abdomen Extremities: no clubbing, cyanosis; 1+ edema  Lab Results:  Recent Labs  03/27/14 1150 03/28/14 0411  NA 129* 132*  K 5.4* 5.1  CL 96 100  CO2 23 24  GLUCOSE 98 91  BUN 29* 25*  CREATININE 1.45* 1.19  CALCIUM 8.9 8.4    Recent Labs  03/27/14 2117  AST 31  ALT 21  ALKPHOS 162*  BILITOT 0.6  PROT 6.6  ALBUMIN 2.4*    Recent Labs  03/27/14 1150 03/28/14 0411  WBC 5.7 5.7  NEUTROABS 3.8  --   HGB 8.7* 8.2*  HCT 25.6* 23.4*  MCV 90.5 90.3  PLT 140* 117*   Lab Results  Component Value Date   INR 1.30 11/22/2013   INR 1.27 11/21/2013   INR 1.3* 09/24/2013   No results found for this basename: CKTOTAL, CKMB, CKMBINDEX, TROPONINI,  in the last 72 hours No results found for this basename: TSH, T4TOTAL, FREET3, T3FREE, THYROIDAB,  in the last 72 hours No results  found for this basename: VITAMINB12, FOLATE, FERRITIN, TIBC, IRON, RETICCTPCT,  in the last 72 hours  Studies/Results: Dg Chest 2 View  03/27/2014   CLINICAL DATA:  Fall, bilateral hip pain  EXAM: CHEST  2 VIEW  COMPARISON:  11/21/2013  FINDINGS: Cardiomediastinal silhouette is stable. Status post median sternotomy. No acute infiltrate or pleural effusion. No pulmonary edema. Mild degenerative changes thoracic spine.  IMPRESSION: No active cardiopulmonary disease.   Electronically Signed   By: Natasha Mead M.D.   On: 03/27/2014 13:16   Dg Lumbar Spine Complete  03/27/2014   CLINICAL DATA:  Status post fall now with low back and left hip pain. ; history of previous lumbar disk surgery  EXAM: LUMBAR SPINE - COMPLETE 4+ VIEW  COMPARISON:  None.  FINDINGS: The lumbar vertebral bodies are preserved in height. The intervertebral disc space heights are well maintained. The patient has undergone laminectomy at L4 and possibly at L5. There is no spondylolisthesis. There is mild facet joint degenerative change in the mid and lower lumbar spine. The pedicles are intact. The transverse processes cannot be adequately assessed due to osteopenia and overlying stool and gas. There is mild degenerative change of the SI joints.  IMPRESSION: No acute abnormality of the lumbar spine is demonstrated. Mild degenerative changes are present.   Electronically Signed   By: David  [place]  On: 03/27/2014 13:25   Dg Elbow Complete Left  03/27/2014   CLINICAL DATA:  Pain post trauma  EXAM: LEFT ELBOW - COMPLETE 3+ VIEW  COMPARISON:  None.  FINDINGS: Frontal, lateral, and bilateral oblique views were obtained. There is no fracture, dislocation, or effusion. There is no appreciable joint space narrowing. There is a small exostosis arising from the distal lateral humeral metaphysis. Small calcifications in the volar aspect of the joint are probably of arthropathic etiology. No erosive change.  IMPRESSION: No fracture, dislocation, or  effusion.   Electronically Signed   By: Bretta Bang M.D.   On: 03/27/2014 13:13   Dg Hip Complete Left  03/27/2014   CLINICAL DATA:  Fall, bilateral left greater than right hip pain  EXAM: LEFT HIP - COMPLETE 2+ VIEW  COMPARISON:  None.  FINDINGS: There is no evidence of hip fracture or dislocation. There is no evidence of arthropathy or other focal bone abnormality. Vascular calcifications are noted. Small amount of stool projects over the rectum, accounting for apparent lucency over the right symphysis pubis.  IMPRESSION: Negative.   Electronically Signed   By: Christiana Pellant M.D.   On: 03/27/2014 13:20   Dg Hip Complete Right  03/27/2014   CLINICAL DATA:  Right hip pain, fall  EXAM: RIGHT HIP - COMPLETE 2+ VIEW  COMPARISON:  None.  FINDINGS: There is no evidence of hip fracture or dislocation. There is no evidence of arthropathy or other focal bone abnormality. Vascular calcifications are noted.  IMPRESSION: Negative.   Electronically Signed   By: Christiana Pellant M.D.   On: 03/27/2014 13:21   Ct Head Wo Contrast  03/27/2014   CLINICAL DATA:  Pain post trauma  EXAM: CT HEAD WITHOUT CONTRAST  CT CERVICAL SPINE WITHOUT CONTRAST  TECHNIQUE: Multidetector CT imaging of the head and cervical spine was performed following the standard protocol without intravenous contrast. Multiplanar CT image reconstructions of the cervical spine were also generated.  COMPARISON:  Chest CT November 21, 2013  FINDINGS: CT HEAD FINDINGS  The ventricles are normal in size and configuration. There is a subdural hematoma on the left which shows decreased attenuation, suggesting chronicity. These subdural hematoma on the left has a maximum thickness of 1.2 cm. There is impression on the left frontal, left superior temporal, and left parietal lobes. There is modest midline shift toward the right of approximately 4 mm. There is no intra-axial hemorrhage. There is mild patchy small vessel disease in the centra semiovale  bilaterally. No acute appearing infarct is seen. Bony calvarium appears intact. The mastoid air cells are clear.  CT CERVICAL SPINE FINDINGS  There is no fracture or spondylolisthesis. Prevertebral soft tissues and predental space regions are normal. There is moderate disc space narrowing at C5-6. There is mild disc space narrowing at C6-7. There is facet hypertrophy at multiple levels bilaterally. There is no disc extrusion or stenosis. There is calcification in both carotid arteries.  IMPRESSION: CT head: Subdural hematoma on the left which appears chronic based on the low attenuation of the fluid. There is mass effect and slight midline shift toward the right. There is patchy periventricular small vessel disease without evidence of acute infarct. No acute appearing hemorrhage is seen. No intra-axial hemorrhage seen.  CT cervical spine: No fracture or spondylolisthesis. Osteoarthritic change. No disc extrusion or stenosis. Calcification in both carotid arteries.  Critical Value/emergent results were called by telephone at the time of interpretation on 03/27/2014 at 12:41 PM to Dr. Roxy Horseman ,  who verbally acknowledged these results.   Electronically Signed   By: Bretta BangWilliam  Woodruff M.D.   On: 03/27/2014 12:43   Ct Cervical Spine Wo Contrast  03/27/2014   CLINICAL DATA:  Pain post trauma  EXAM: CT HEAD WITHOUT CONTRAST  CT CERVICAL SPINE WITHOUT CONTRAST  TECHNIQUE: Multidetector CT imaging of the head and cervical spine was performed following the standard protocol without intravenous contrast. Multiplanar CT image reconstructions of the cervical spine were also generated.  COMPARISON:  Chest CT November 21, 2013  FINDINGS: CT HEAD FINDINGS  The ventricles are normal in size and configuration. There is a subdural hematoma on the left which shows decreased attenuation, suggesting chronicity. These subdural hematoma on the left has a maximum thickness of 1.2 cm. There is impression on the left frontal, left  superior temporal, and left parietal lobes. There is modest midline shift toward the right of approximately 4 mm. There is no intra-axial hemorrhage. There is mild patchy small vessel disease in the centra semiovale bilaterally. No acute appearing infarct is seen. Bony calvarium appears intact. The mastoid air cells are clear.  CT CERVICAL SPINE FINDINGS  There is no fracture or spondylolisthesis. Prevertebral soft tissues and predental space regions are normal. There is moderate disc space narrowing at C5-6. There is mild disc space narrowing at C6-7. There is facet hypertrophy at multiple levels bilaterally. There is no disc extrusion or stenosis. There is calcification in both carotid arteries.  IMPRESSION: CT head: Subdural hematoma on the left which appears chronic based on the low attenuation of the fluid. There is mass effect and slight midline shift toward the right. There is patchy periventricular small vessel disease without evidence of acute infarct. No acute appearing hemorrhage is seen. No intra-axial hemorrhage seen.  CT cervical spine: No fracture or spondylolisthesis. Osteoarthritic change. No disc extrusion or stenosis. Calcification in both carotid arteries.  Critical Value/emergent results were called by telephone at the time of interpretation on 03/27/2014 at 12:41 PM to Dr. Roxy HorsemanOBERT BROWNING , who verbally acknowledged these results.   Electronically Signed   By: Bretta BangWilliam  Woodruff M.D.   On: 03/27/2014 12:43   Dg Hand Complete Left  03/27/2014   CLINICAL DATA:  Pain post trauma  EXAM: LEFT HAND - COMPLETE 3+ VIEW  COMPARISON:  None.  FINDINGS: Frontal, oblique, and lateral views were obtained. There is a small metallic foreign body in the soft tissues adjacent to the first metacarpal along the lateral aspect. There is evidence of an old fracture along the proximal first metacarpal which shows remodeling. There is no demonstrable acute fracture or dislocation. There is osteoarthritic change in  all MCP, PIP, and DIP joints. There is also narrowing of the radiocarpal joint. No erosive change.  IMPRESSION: Small metallic foreign body slightly lateral to the proximal first metacarpal. Evidence of an old healed fracture of the first metacarpal. There is multilevel osteoarthritic change. No acute fracture apparent. No dislocation.   Electronically Signed   By: Bretta BangWilliam  Woodruff M.D.   On: 03/27/2014 13:18     Medications: Scheduled: . antiseptic oral rinse  15 mL Mouth Rinse q12n4p  . chlorhexidine  15 mL Mouth Rinse BID  . escitalopram  5 mg Oral Daily  . feeding supplement (ENSURE COMPLETE)  237 mL Oral TID BM  . hydrALAZINE  25 mg Oral BID  . lactulose  20 g Oral BID  . pantoprazole  20 mg Oral Daily  . rifaximin  550 mg Oral BID  . valACYclovir  1,000 mg Oral TID   Continuous: . sodium chloride 75 mL/hr at 03/27/14 2019    Assessment/Plan: Active Problems:  1. Recurrent falls/Unsteady gait- multifactorial secondary to generalized weakness, orthostasis set in the setting of volume depletion secondary to diuretic therapy, and hepatic encephalopathy. Continue IV hydration, hold diuretics, and treat encephalopathy. PT/OT evaluation. Check orthostatics (unable to perform last pm due to pain).  Obtain Thoracic Spine and left rib series X-rays to rule out fracture as cause of left sided pain. 2. 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 Ammonia level elevated. Continue Xifaxan in addition to Lactulose.  Will discontinue Lactulose when improved. 3. Ascites-we'll need to monitor for worsening ascites with fluid hydration and holding diuretics. Anticipate need to restart lower dose diuretics once volume is stable.  4. Hyponatremia-improved with hydration and withholding diuretics.  5. Chronic combined systolic and diastolic CHF (congestive heart failure)- EF 40-45%-monitor volume status with fluid  hydration for any signs of volume overload.   6. Acute renal insufficiency-secondary to prerenal azotemia. Improved with hydration.  7. Protein-calorie malnutrition, severe-encouraged by mouth intake. Continue Ensure.  8. Dehydration-IV fluid hydration. Hold diuretics.  9. 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 Hold any anticoagulants. Neurosurgery has reviewed and did not recommend any surgical intervention. Plan for outpatient followup as long as mental status continues to improve. 10. Herpes zoster-he is on respiratory isolation. Continue Valtrex.  11.Disposition-anticipate discharge to home with home health services in 2-3 days if stable.  12. Ethics- FULL CODE    LOS: 1 day   Martha Clan 03/28/2014, 5:52 AM

## 2014-03-29 ENCOUNTER — Inpatient Hospital Stay (HOSPITAL_COMMUNITY): Payer: Medicare Other

## 2014-03-29 LAB — BASIC METABOLIC PANEL
BUN: 19 mg/dL (ref 6–23)
CO2: 21 mEq/L (ref 19–32)
CREATININE: 1.03 mg/dL (ref 0.50–1.35)
Calcium: 8.2 mg/dL — ABNORMAL LOW (ref 8.4–10.5)
Chloride: 102 mEq/L (ref 96–112)
GFR calc non Af Amer: 65 mL/min — ABNORMAL LOW (ref >90)
GFR, EST AFRICAN AMERICAN: 75 mL/min — AB (ref >90)
Glucose, Bld: 95 mg/dL (ref 70–99)
Potassium: 4.4 mEq/L (ref 3.7–5.3)
Sodium: 132 mEq/L — ABNORMAL LOW (ref 137–147)

## 2014-03-29 LAB — BODY FLUID CELL COUNT WITH DIFFERENTIAL
Eos, Fluid: 1 %
LYMPHS FL: 50 %
MONOCYTE-MACROPHAGE-SEROUS FLUID: 33 % — AB (ref 50–90)
Neutrophil Count, Fluid: 16 % (ref 0–25)
Total Nucleated Cell Count, Fluid: 156 cu mm (ref 0–1000)

## 2014-03-29 LAB — CBC
HEMATOCRIT: 25 % — AB (ref 39.0–52.0)
Hemoglobin: 8.7 g/dL — ABNORMAL LOW (ref 13.0–17.0)
MCH: 31.8 pg (ref 26.0–34.0)
MCHC: 34.8 g/dL (ref 30.0–36.0)
MCV: 91.2 fL (ref 78.0–100.0)
Platelets: 120 10*3/uL — ABNORMAL LOW (ref 150–400)
RBC: 2.74 MIL/uL — ABNORMAL LOW (ref 4.22–5.81)
RDW: 14.8 % (ref 11.5–15.5)
WBC: 6.7 10*3/uL (ref 4.0–10.5)

## 2014-03-29 LAB — ALBUMIN, FLUID (OTHER): ALBUMIN FL: 0.8 g/dL

## 2014-03-29 MED ORDER — LACTULOSE 10 GM/15ML PO SOLN
30.0000 g | Freq: Three times a day (TID) | ORAL | Status: DC
  Administered 2014-03-29 – 2014-03-31 (×8): 30 g via ORAL
  Filled 2014-03-29 (×11): qty 45

## 2014-03-29 MED ORDER — SPIRONOLACTONE 50 MG PO TABS
50.0000 mg | ORAL_TABLET | Freq: Two times a day (BID) | ORAL | Status: DC
  Administered 2014-03-29 – 2014-04-01 (×6): 50 mg via ORAL
  Filled 2014-03-29 (×8): qty 1

## 2014-03-29 MED ORDER — MORPHINE SULFATE 2 MG/ML IJ SOLN
2.0000 mg | INTRAMUSCULAR | Status: DC | PRN
  Administered 2014-03-29 – 2014-03-30 (×4): 2 mg via INTRAVENOUS
  Filled 2014-03-29 (×4): qty 1

## 2014-03-29 NOTE — Progress Notes (Signed)
PT Cancellation Note  Patient Details Name: Kirk [name] MRN: [id number] DOB: May 03, 1927   Cancelled Treatment:    Reason Eval/Treat Not Completed: Patient at procedure or test/unavailable--checked on pt a 2nd time today. Unable to see this time due to pt leaving for a procedure. Will check back on tomorrow to perform evaluation, if able. Thanks.    Carlynn Spry Kortny Lirette 03/29/2014, 2:06 PM Rebeca Alert, MPT Pager: [phone]

## 2014-03-29 NOTE — Progress Notes (Signed)
Subjective: Complains of severe abdominal pain and increased abdominal distention.  Objective: Vital signs in last 24 hours: Temp:  [98 F (36.7 C)-98.7 F (37.1 C)] 98.1 F (36.7 C) (05/24 1235) Pulse Rate:  [76-88] 88 (05/24 1235) Resp:  [18-20] 18 (05/24 1235) BP: (108-135)/(46-81) 135/81 mmHg (05/24 1235) SpO2:  [96 %-98 %] 97 % (05/24 1235) Weight:  [57.5 kg (126 lb 12.2 oz)] 57.5 kg (126 lb 12.2 oz) (05/24 0449) Weight change: 1.7 kg (3 lb 12 oz) Last BM Date: 03/25/14  CBG (last 3)  No results found for this basename: GLUCAP,  in the last 72 hours  Intake/Output from previous day: 05/23 0701 - 05/24 0700 In: 3540 [P.O.:2040; I.V.:1500] Out: 1150 [Urine:1150] Intake/Output this shift: Total I/O In: 60 [P.O.:60] Out: 300 [Urine:300]  General appearance: alert and in obvious discomfort Eyes: no scleral icterus Throat: oropharynx moist without erythema Resp: clear to auscultation bilaterally Cardio: regular rate and rhythm WN:[id number] distended with increased ascites; hypoactive BS; diffuse tenderness; +/- guarding, no rebound Extremities: no clubbing, cyanosis; trace edema  Lab Results:  Recent Labs  03/28/14 0411 03/29/14 0410  NA 132* 132*  K 5.1 4.4  CL 100 102  CO2 24 21  GLUCOSE 91 95  BUN 25* 19  CREATININE 1.19 1.03  CALCIUM 8.4 8.2*    Recent Labs  03/27/14 2117  AST 31  ALT 21  ALKPHOS 162*  BILITOT 0.6  PROT 6.6  ALBUMIN 2.4*    Recent Labs  03/27/14 1150 03/28/14 0411 03/29/14 0410  WBC 5.7 5.7 6.7  NEUTROABS 3.8  --   --   HGB 8.7* 8.2* 8.7*  HCT 25.6* 23.4* 25.0*  MCV 90.5 90.3 91.2  PLT 140* 117* 120*   Lab Results  Component Value Date   INR 1.30 11/22/2013   INR 1.27 11/21/2013   INR 1.3* 09/24/2013   No results found for this basename: CKTOTAL, CKMB, CKMBINDEX, TROPONINI,  in the last 72 hours No results found for this basename: TSH, T4TOTAL, FREET3, T3FREE, THYROIDAB,  in the last 72 hours No results found for  this basename: VITAMINB12, FOLATE, FERRITIN, TIBC, IRON, RETICCTPCT,  in the last 72 hours  Studies/Results: Dg Ribs Unilateral W/chest Left  03/28/2014   CLINICAL DATA:  Fall left-sided rib pain.  EXAM: LEFT RIBS AND CHEST - 3+ VIEW  COMPARISON:  Thoracic spine radiographs performed today. Chest radiograph 03/27/2014.  FINDINGS: There are changes of median sternotomy for CABG. Mild cardiomegaly is stable. Lung volumes are slightly low. No focal opacities or pleural effusions. Negative for pneumothorax.  No acute or healing left rib fracture identified.  IMPRESSION: No visible left rib fracture.  Cardiomegaly.  No acute findings in the chest.   Electronically Signed   By: Britta Mccreedy M.D.   On: 03/28/2014 11:57   Dg Thoracic Spine 2 View  03/28/2014   CLINICAL DATA:  Fall with left-sided pain. Evaluate for compression fracture.  EXAM: THORACIC SPINE - 2 VIEW  COMPARISON:  Chest radiograph 03/27/2014  FINDINGS: There are changes of median sternotomy for CABG. Thoracic spine vertebral bodies are normal in height and alignment. There are mild degenerative changes of thoracic spine. No thoracic spine compression fracture is identified.  IMPRESSION: No acute bony abnormality identified.   Electronically Signed   By: Britta Mccreedy M.D.   On: 03/28/2014 11:55     Medications: Scheduled: . antiseptic oral rinse  15 mL Mouth Rinse q12n4p  . chlorhexidine  15 mL Mouth Rinse BID  .  escitalopram  5 mg Oral Daily  . feeding supplement (ENSURE COMPLETE)  237 mL Oral TID BM  . hydrALAZINE  25 mg Oral BID  . lactulose  20 g Oral BID  . pantoprazole  20 mg Oral Daily  . rifaximin  550 mg Oral BID  . valACYclovir  1,000 mg Oral TID   Continuous: . sodium chloride 75 mL/hr at 03/28/14 2135    Assessment/Plan: Active Problems:  1. Abdominal pain- increased ascites with holding diuretic therapy with significant increase in abdominal pain.  Obtain ultrasound guided paracentesis today to rule out SBP and for  therapeutic purposes.  Send fluid for cell count, albumen, cytology and culture.  Discontinue IV fluids.  Restart Aldactone.  Obtain KUB.  Continue MSO4 and Vicodin as needed for pain. 2. Recurrent falls/Unsteady gait- multifactorial secondary to generalized weakness, orthostasis in the setting of volume depletion secondary to diuretic therapy, and hepatic encephalopathy.  PT/OT evaluation. Unable to perform orthostatics due to pain. Multiple X-rays negative for fracture.  3. Cirrhosis of liver with Hepatic encephalopathy, ascites, varices- asterixis persists consistent with encephalopathy. Ammonia level elevated. Continue Xifaxan in addition to Lactulose (no BM despite Lactulose). May discontinue Lactulose when improved.  4. Hyponatremia-improved with hydration.  5. Chronic combined systolic and diastolic CHF (congestive heart failure)- EF 40-45%-monitor volume status with fluid hydration for any signs of volume overload.  6. Acute renal insufficiency-Improved with hydration.  7. Protein-calorie malnutrition, severe-encouraged by mouth intake. Continue Ensure.  8. Dehydration-improved with IV fluid hydration. Challenging volume status. 9. 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 Hold any anticoagulants. Neurosurgery has reviewed and did not recommend any surgical intervention. Plan for outpatient followup as long as mental status continues to improve.  10. Herpes zoster-he is on respiratory isolation. Continue Valtrex.  11.Non-Sustained VT- check Magnesium.  Electrolytes OK.  Monitor on telemetry. Disposition-anticipate discharge to home with home health services in 2-3 days if stable.  12. Ethics- FULL CODE    LOS: 2 days   Kirk [name] 03/29/2014, 1:39 PM

## 2014-03-29 NOTE — Progress Notes (Signed)
PT Cancellation Note  Patient Details Name: Kirk [name] MRN: [id number] DOB: [birth year]   Cancelled Treatment:    Reason Eval/Treat Not Completed: Pain limiting ability to participate--spoke with RN who recommends PT check back another day. Will check back on tomorrow. Thanks.    Rebeca Alert, MPT Pager: [phone]

## 2014-03-29 NOTE — Progress Notes (Signed)
Pt had 11 beats of V-tach. Pt asymptomatic. VSS. Dr. Clelia Croft paged. Awaiting return call. Will continue to monitor.

## 2014-03-29 NOTE — Procedures (Signed)
US paracentesis RLQ THin blood tinged fluid, samples sent for requested labs. No complication No blood loss. See complete dictation in [facility].

## 2014-03-29 NOTE — Progress Notes (Signed)
Dr. Clelia Croft returned paged. No new orders received. Will continue to monitor.

## 2014-03-29 NOTE — Progress Notes (Signed)
Writer received phone call from  pt wife stating that "pt was in a lot of pain and could not find his call light to call anyone." Upon entering pt had his call light in his right hand and his phone in his left hand. Writer re-educated pt on how to call the nurse directly using his phone  and how to use the call lights on his bed as well as the call light on his remote. Pt demonstrated and verbalized understanding of the call light and how to call the nurse phone. Pt c/o 7/10 pain on right flank and back. Pt requesting I call ER doctor to come and see him. Writer explained to pt that I could call Dr. Jacky Kindle office and request more pain medication. Pt continuously said "I'm not a drug addict I'm just in pain." Writer explained to pt that I didn't think he was an addict. Pt then stated " well my neighbors do." Writer explained to pt that I was not his neighbor and that I  belived he was in pain and would page the doctor to see if he would order more pain medication. Dr Clelia Croft notified and New orders placed in Epic. Will continue to monitor.

## 2014-03-30 LAB — BASIC METABOLIC PANEL
BUN: 16 mg/dL (ref 6–23)
CO2: 21 mEq/L (ref 19–32)
Calcium: 8.6 mg/dL (ref 8.4–10.5)
Chloride: 104 mEq/L (ref 96–112)
Creatinine, Ser: 1.01 mg/dL (ref 0.50–1.35)
GFR, EST AFRICAN AMERICAN: 77 mL/min — AB (ref >90)
GFR, EST NON AFRICAN AMERICAN: 66 mL/min — AB (ref >90)
Glucose, Bld: 154 mg/dL — ABNORMAL HIGH (ref 70–99)
Potassium: 5 mEq/L (ref 3.7–5.3)
SODIUM: 135 meq/L — AB (ref 137–147)

## 2014-03-30 LAB — MAGNESIUM: Magnesium: 2.2 mg/dL (ref 1.5–2.5)

## 2014-03-30 LAB — AFP TUMOR MARKER: AFP-Tumor Marker: 2.7 ng/mL (ref 0.0–8.0)

## 2014-03-30 LAB — CBC
HCT: 30.1 % — ABNORMAL LOW (ref 39.0–52.0)
Hemoglobin: 10.2 g/dL — ABNORMAL LOW (ref 13.0–17.0)
MCH: 30.9 pg (ref 26.0–34.0)
MCHC: 33.9 g/dL (ref 30.0–36.0)
MCV: 91.2 fL (ref 78.0–100.0)
Platelets: 131 10*3/uL — ABNORMAL LOW (ref 150–400)
RBC: 3.3 MIL/uL — ABNORMAL LOW (ref 4.22–5.81)
RDW: 14.6 % (ref 11.5–15.5)
WBC: 9.5 10*3/uL (ref 4.0–10.5)

## 2014-03-30 NOTE — Progress Notes (Signed)
Subjective: Less pain and abdominal distention since Paracentesis. Could not find the vol removed. He took a little while to wake up and seems a little less confused than prior reports. No BM reported since 5/20?  Objective: Vital signs in last 24 hours: Temp:  [97.9 F (36.6 C)-98.6 F (37 C)] 98.2 F (36.8 C) (05/25 16100614) Pulse Rate:  [79-88] 87 (05/25 0614) Resp:  [18] 18 (05/25 0614) BP: (113-135)/(51-91) 113/58 mmHg (05/25 0614) SpO2:  [96 %-97 %] 97 % (05/25 0614) Weight:  [51 kg (112 lb 7 oz)] 51 kg (112 lb 7 oz) (05/25 0614) Weight change: -6.5 kg (-14 lb 5.3 oz) Last BM Date: 03/25/14  CBG (last 3)  No results found for this basename: GLUCAP,  in the last 72 hours  Intake/Output from previous day: 05/24 0701 - 05/25 0700 In: 773.8 [P.O.:120; I.V.:653.8] Out: 1100 [Urine:1100] Intake/Output this shift:    General appearance: Awake/Alert.  Looks older than stated age - facial muscle Atrophy Eyes: no scleral icterus Throat: oropharynx moist without erythema Resp: clear to auscultation bilaterally Cardio: regular rate and rhythm RU:[id number]:markedly distended with ascites; hypoactive BS; some tenderness; +/- guarding, no rebound Mild Asterixis. R lower Ab Dried up shingles blisters. Extremities: no clubbing, cyanosis; trace edema  Lab Results:  Recent Labs  03/29/14 0410 03/30/14 0351  NA 132* 135*  K 4.4 5.0  CL 102 104  CO2 21 21  GLUCOSE 95 154*  BUN 19 16  CREATININE 1.03 1.01  CALCIUM 8.2* 8.6    Recent Labs  03/27/14 2117  AST 31  ALT 21  ALKPHOS 162*  BILITOT 0.6  PROT 6.6  ALBUMIN 2.4*    Recent Labs  03/27/14 1150  03/29/14 0410 03/30/14 0351  WBC 5.7  < > 6.7 9.5  NEUTROABS 3.8  --   --   --   HGB 8.7*  < > 8.7* 10.2*  HCT 25.6*  < > 25.0* 30.1*  MCV 90.5  < > 91.2 91.2  PLT 140*  < > 120* 131*  < > = values in this interval not displayed. Lab Results  Component Value Date   INR 1.30 11/22/2013   INR 1.27 11/21/2013   INR 1.3*  09/24/2013   No results found for this basename: CKTOTAL, CKMB, CKMBINDEX, TROPONINI,  in the last 72 hours No results found for this basename: TSH, T4TOTAL, FREET3, T3FREE, THYROIDAB,  in the last 72 hours No results found for this basename: VITAMINB12, FOLATE, FERRITIN, TIBC, IRON, RETICCTPCT,  in the last 72 hours  Studies/Results: Dg Ribs Unilateral W/chest Left  03/28/2014   CLINICAL DATA:  Fall left-sided rib pain.  EXAM: LEFT RIBS AND CHEST - 3+ VIEW  COMPARISON:  Thoracic spine radiographs performed today. Chest radiograph 03/27/2014.  FINDINGS: There are changes of median sternotomy for CABG. Mild cardiomegaly is stable. Lung volumes are slightly low. No focal opacities or pleural effusions. Negative for pneumothorax.  No acute or healing left rib fracture identified.  IMPRESSION: No visible left rib fracture.  Cardiomegaly.  No acute findings in the chest.   Electronically Signed   By: Britta MccreedySusan  Turner M.D.   On: 03/28/2014 11:57   Dg Thoracic Spine 2 View  03/28/2014   CLINICAL DATA:  Fall with left-sided pain. Evaluate for compression fracture.  EXAM: THORACIC SPINE - 2 VIEW  COMPARISON:  Chest radiograph 03/27/2014  FINDINGS: There are changes of median sternotomy for CABG. Thoracic spine vertebral bodies are normal in height and alignment. There are mild degenerative  changes of thoracic spine. No thoracic spine compression fracture is identified.  IMPRESSION: No acute bony abnormality identified.   Electronically Signed   By: Britta Mccreedy M.D.   On: 03/28/2014 11:55   Dg Abd 2 Views  03/29/2014   CLINICAL DATA:  Abdominal pain  EXAM: ABDOMEN - 2 VIEW  COMPARISON:  CT, 08/06/2012.  FINDINGS: There is mild colonic distention with moderate increased colonic stool. There is no evidence of obstruction. There is no free air.  Soft tissues are not well defined. Bony structures are demineralized.  IMPRESSION: 1. No acute findings. 2. Moderate increased stool in the colon.   Electronically Signed    By: Amie Portland M.D.   On: 03/29/2014 14:44     Medications: Scheduled: . antiseptic oral rinse  15 mL Mouth Rinse q12n4p  . chlorhexidine  15 mL Mouth Rinse BID  . escitalopram  5 mg Oral Daily  . feeding supplement (ENSURE COMPLETE)  237 mL Oral TID BM  . hydrALAZINE  25 mg Oral BID  . lactulose  30 g Oral TID  . pantoprazole  20 mg Oral Daily  . rifaximin  550 mg Oral BID  . spironolactone  50 mg Oral BID  . valACYclovir  1,000 mg Oral TID   Continuous:    Assessment/Plan: 1. Abdominal pain- he developed increased ascites with holding diuretic therapy with significant increase in abdominal pain.  Ultrasound guided paracentesis 5/24 without SBP.  Albumin Fluid = 0.8 and Serum Ascited Albumin Gradient is 1.6, cytology and culture P.  IV fluids D/ced 5/24.  Aldactone restarted 5/24.  KUB was (-) x for constipation.  Continue MSO4 and Vicodin as needed for pain.  I ordered an AFP to help deliniate whether a hepatocellular CA may be present 2. Recurrent falls/Unsteady gait- multifactorial secondary to generalized weakness, orthostasis in the setting of volume depletion secondary to diuretic therapy, and hepatic encephalopathy.  PT/OT evaluations still Pending. Unable to perform orthostatics due to pain. Multiple X-rays negative for fracture.  3. Cirrhosis of liver with Hepatic encephalopathy, ascites, varices- asterixis persistent consistent with encephalopathy. Ammonia level elevated. Continue Xifaxan in addition to Lactulose (no BM despite Lactulose). May discontinue Lactulose when improved.  4. Hyponatremia-improved with hydration. Currently 135. 5. Chronic combined systolic and diastolic CHF (congestive heart failure)- EF 40-45%-monitor volume status with fluid hydration for any signs of volume overload.  6. Acute Kidney Injury-Improved with hydration. Cr 1.01 7. Protein-calorie malnutrition, severe-encouraged by mouth intake. Continue Ensure.  8. Dehydration-improved with IV fluid  hydration. Challenging volume status. 9. 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 Hold any anticoagulants. Neurosurgery has reviewed and did not recommend any surgical intervention. Plan for outpatient followup as long as mental status continues to improve.  10. Herpes zoster-he is on respiratory isolation. Continue Valtrex.  11.Non-Sustained VT - Electrolytes OK.  Monitor on telemetry. Disposition-anticipate discharge to home with home health services in 2-3 days if stable.  12. Ethics- FULL CODE.  With all of his med issues discussions regarding DNR and future poor prognosis needs to start soon.    LOS: 3 days   Gwen Pounds 03/30/2014, 7:36 AM

## 2014-03-30 NOTE — Evaluation (Signed)
Physical Therapy Evaluation Patient Details Name: Kirk [name] MRN: [id number] DOB: July 17, 1927 Today's Date: 03/30/2014   History of Present Illness  Kirk [name] is a 80 year old white male admitted 03/27/14 with a history of cirrhosis secondary to presumed NASH located by ascites, hepatic encephalopathy, and esophageal varices who presented to the emergency department following a fall. His wife reports that he has had several falls over the past several weeks,Pt also with diagnosis of shingles on trunk. Negative for fractures.  Clinical Impression  Pt ambulated   X 50 '. Pt will benefit from PT to address problems listed in note. Pt was ambulatory w/ cane PTA. Pt plans to Dc to home. No family present to discuss.    Follow Up Recommendations Home health PT;Supervision/Assistance - 24 hour (if pt has 24/7 caregivers and can manage.)    Equipment Recommendations  None recommended by PT    Recommendations for Other Services       Precautions / Restrictions Precautions Precautions: Fall Precaution Comments: airborne      Mobility  Bed Mobility Overal bed mobility: Needs Assistance Bed Mobility: Supine to Sit     Supine to sit: Supervision     General bed mobility comments: extra time due to pain on L and right sides of trunk, pt required no assistance  Transfers Overall transfer level: Needs assistance Equipment used: Rolling walker (2 wheeled) Transfers: Sit to/from Stand Sit to Stand: Min assist         General transfer comment: extra time to rise, cues for UE use/position  Ambulation/Gait Ambulation/Gait assistance: Min assist Ambulation Distance (Feet): 50 Feet Assistive device: Rolling walker (2 wheeled) Gait Pattern/deviations: Step-through pattern;Decreased stride length   Gait velocity interpretation: <1.8 ft/sec, indicative of risk for recurrent falls General Gait Details: cues for sequence, extra time with c/o LLE pain  Stairs             Wheelchair Mobility    Modified Rankin (Stroke Patients Only)       Balance Overall balance assessment: Needs assistance;History of Falls Sitting-balance support: No upper extremity supported;Feet supported Sitting balance-Leahy Scale: Fair     Standing balance support: During functional activity Standing balance-Leahy Scale: Poor                               Pertinent Vitals/Pain Premedicated.reports pain in R trunk and L hip area.    Home Living Family/patient expects to be discharged to:: Private residence Living Arrangements: Spouse/significant other Available Help at Discharge: Family Type of Home: House Home Access: Stairs to enter Entrance Stairs-Rails: Left Entrance Stairs-Number of Steps: 3 Home Layout: One level Home Equipment: Walker - 2 wheels;Cane - single point      Prior Function Level of Independence: Independent with assistive device(s)               Hand Dominance        Extremity/Trunk Assessment   Upper Extremity Assessment: Generalized weakness           Lower Extremity Assessment: Generalized weakness      Cervical / Trunk Assessment: Normal  Communication   Communication: HOH  Cognition Arousal/Alertness: Awake/alert Behavior During Therapy: WFL for tasks assessed/performed Overall Cognitive Status: Within Functional Limits for tasks assessed                      General Comments      Exercises  Assessment/Plan    PT Assessment Patient needs continued PT services  PT Diagnosis Difficulty walking;Acute pain   PT Problem List Decreased strength;Decreased activity tolerance;Pain;Decreased knowledge of use of DME;Decreased safety awareness;Decreased knowledge of precautions;Decreased balance;Decreased mobility  PT Treatment Interventions DME instruction;Gait training;Functional mobility training;Therapeutic activities;Therapeutic exercise;Patient/family education   PT Goals (Current  goals can be found in the Care Plan section) Acute Rehab PT Goals Patient Stated Goal: I want to go home PT Goal Formulation: With patient Time For Goal Achievement: 04/13/14 Potential to Achieve Goals: Good    Frequency Min 3X/week   Barriers to discharge        Co-evaluation               End of Session   Activity Tolerance: Patient limited by fatigue;Patient limited by pain Patient left: in chair;with call bell/phone within reach;with chair alarm set Nurse Communication: Mobility status         Time: [id number] PT Time Calculation (min): 26 min   Charges:   PT Evaluation $Initial PT Evaluation Tier I: 1 Procedure PT Treatments $Gait Training: 23-37 mins   PT G Codes:          Kirk HayKaren Elizabeth Samiya [name] 03/30/2014, 11:33 AM Blanchard KelchKaren Raunak [name] PT [phone]

## 2014-03-31 LAB — CBC
HEMATOCRIT: 27.1 % — AB (ref 39.0–52.0)
Hemoglobin: 9 g/dL — ABNORMAL LOW (ref 13.0–17.0)
MCH: 30.9 pg (ref 26.0–34.0)
MCHC: 33.2 g/dL (ref 30.0–36.0)
MCV: 93.1 fL (ref 78.0–100.0)
PLATELETS: 127 10*3/uL — AB (ref 150–400)
RBC: 2.91 MIL/uL — ABNORMAL LOW (ref 4.22–5.81)
RDW: 14.8 % (ref 11.5–15.5)
WBC: 8.2 10*3/uL (ref 4.0–10.5)

## 2014-03-31 LAB — BASIC METABOLIC PANEL
BUN: 17 mg/dL (ref 6–23)
CHLORIDE: 103 meq/L (ref 96–112)
CO2: 21 mEq/L (ref 19–32)
Calcium: 8.4 mg/dL (ref 8.4–10.5)
Creatinine, Ser: 1.05 mg/dL (ref 0.50–1.35)
GFR calc Af Amer: 73 mL/min — ABNORMAL LOW (ref >90)
GFR calc non Af Amer: 63 mL/min — ABNORMAL LOW (ref >90)
Glucose, Bld: 113 mg/dL — ABNORMAL HIGH (ref 70–99)
Potassium: 4.8 mEq/L (ref 3.7–5.3)
Sodium: 133 mEq/L — ABNORMAL LOW (ref 137–147)

## 2014-03-31 LAB — AMMONIA: Ammonia: 57 umol/L (ref 11–60)

## 2014-03-31 NOTE — ED Provider Notes (Signed)
Medical screening examination/treatment/procedure(s) were conducted as a shared visit with non-physician practitioner(s) and myself.  I personally evaluated the patient during the encounter.   EKG Interpretation   Date/Time:  Friday Mar 27 2014 11:49:57 EDT Ventricular Rate:  67 PR Interval:  148 QRS Duration: 104 QT Interval:  445 QTC Calculation: 470 R Axis:   22 Text Interpretation:  Sinus rhythm similar to prior Confirmed by Nephtali Docken   MD, Sathvik Tiedt ([id number]) on 03/27/2014 11:55:36 AM      Patient presents following a fall. Multiple episodes of falling in the last several months. On workup found to have likely subacute/chronic subdural. Neurosurgery consulted, recommend f/u in 10 days in the office. Also noted to have AKI and hyponatremia.  Will be admitted to for medical optimization and PT eval.  Shon Baton, MD 03/31/14 [phone]

## 2014-03-31 NOTE — Progress Notes (Signed)
Subjective: Patient seems to be brighter clear recognizes me immediately. He is now moving his bowels. He relates less pain and better mobility. By mouth intake remains poor.  Objective: Vital signs in last 24 hours: Temp:  [97.6 F (36.4 C)-98.1 F (36.7 C)] 97.6 F (36.4 C) (05/26 0447) Pulse Rate:  [75-85] 82 (05/26 0447) Resp:  [18-20] 20 (05/26 0447) BP: (110-130)/(50-72) 110/51 mmHg (05/26 0447) SpO2:  [97 %-98 %] 97 % (05/26 0447) Weight:  [51.8 kg (114 lb 3.2 oz)] 51.8 kg (114 lb 3.2 oz) (05/26 0605) Weight change: 0.8 kg (1 lb 12.2 oz)  CBG (last 3)  No results found for this basename: GLUCAP,  in the last 72 hours  Intake/Output from previous day: 05/25 0701 - 05/26 0700 In: 180 [P.O.:180] Out: 525 [Urine:525]  Physical Exam: Alert awake interactive no distress Good facial symmetry bitemporal wasting No JVD or bruits Lungs clear diminished breath sounds at the bases Heart sounds distant regular no murmur Abdomen ascites not tense no tenderness Extremities with only trace edema intact pulses Right thoracic zoster lesions are scabbing   Lab Results:  Recent Labs  03/30/14 0351 03/30/14 0900 03/31/14 0430  NA 135*  --  133*  K 5.0  --  4.8  CL 104  --  103  CO2 21  --  21  GLUCOSE 154*  --  113*  BUN 16  --  17  CREATININE 1.01  --  1.05  CALCIUM 8.6  --  8.4  MG  --  2.2  --    No results found for this basename: AST, ALT, ALKPHOS, BILITOT, PROT, ALBUMIN,  in the last 72 hours  Recent Labs  03/30/14 0351 03/31/14 0430  WBC 9.5 8.2  HGB 10.2* 9.0*  HCT 30.1* 27.1*  MCV 91.2 93.1  PLT 131* 127*   Lab Results  Component Value Date   INR 1.30 11/22/2013   INR 1.27 11/21/2013   INR 1.3* 09/24/2013   No results found for this basename: CKTOTAL, CKMB, CKMBINDEX, TROPONINI,  in the last 72 hours No results found for this basename: TSH, T4TOTAL, FREET3, T3FREE, THYROIDAB,  in the last 72 hours No results found for this basename: VITAMINB12, FOLATE,  FERRITIN, TIBC, IRON, RETICCTPCT,  in the last 72 hours  Studies/Results: US Paracentesis  03/31/2014   CLINICAL DATA:  Cirrhosis, recurrent abdominal ascites  EXAM: ULTRASOUND GUIDED PARACENTESIS  TECHNIQUE: The procedure, risks (including but not limited to bleeding, infection, organ damage ), benefits, and alternatives were explained to the patient. Questions regarding the procedure were encouraged and answered. The patient understands and consents to the procedure. Survey ultrasound of the abdomen was performed and an appropriate skin entry site in the abdomen was selected. Skin site was marked, prepped with Betadine, and draped in usual sterile fashion, and infiltrated locally with 1% lidocaine. A 5 French multisidehole Yueh sheath needle was advanced into the peritoneal space until fluid could be aspirated. The sheath was advanced and the needle removed. 4 L of clear yellowascites were aspirated. Samples sent for the requested laboratory studies. No immediate complication.  IMPRESSION: Technically successful ultrasound guided paracentesis, removing 4 L of ascites.   Electronically Signed   By: Oley Balm M.D.   On: 03/31/2014 05:22   Dg Abd 2 Views  03/29/2014   CLINICAL DATA:  Abdominal pain  EXAM: ABDOMEN - 2 VIEW  COMPARISON:  CT, 08/06/2012.  FINDINGS: There is mild colonic distention with moderate increased colonic stool. There is no evidence  of obstruction. There is no free air.  Soft tissues are not well defined. Bony structures are demineralized.  IMPRESSION: 1. No acute findings. 2. Moderate increased stool in the colon.   Electronically Signed   By: Amie Portlandavid  Ormond M.D.   On: 03/29/2014 14:44     Assessment/Plan: #1 ascites no evidence of peritonitis probably at baseline as well compensated as possible  #2 Elita BooneNash- with encephalopathy, ascites, varices all close to baseline  #3 chronic combined systolic and diastolic congestive heart failure EF 40-45% stable  #4 protein calorie  malnutrition stable difficult encourage supplements  #5 subdural hematoma asymptomatic and chronic  #6 herpes zoster right thoracic scabbing  Ultimately start transitioning and target discharged hopefully tomorrow   LOS: 4 days   Minda Meoichard A Johnae Friley 03/31/2014, 10:00 AM

## 2014-03-31 NOTE — Progress Notes (Signed)
Physical Therapy Treatment Patient Details Name: Kirk [name] MRN: [id number] DOB: 09-19-1927 Today's Date: 03/31/2014    History of Present Illness Mr. Kedrowski is a 80 year old white male admitted 03/27/14 with a history of cirrhosis secondary to presumed NASH located by ascites, hepatic encephalopathy, and esophageal varices who presented to the emergency department following a fall. His wife reports that he has had several falls over the past several weeks,Pt also with diagnosis of shingles on trunk    PT Comments    Assisted pt OOB to amb in hallway. Assisted to bathroom then positioned in recliner.  Spouse present during session.  Planning to D/C to home "hopefully tomorrow" stated spouse.  Follow Up Recommendations  Home health PT;Supervision/Assistance - 24 hour     Equipment Recommendations  None recommended by PT    Recommendations for Other Services       Precautions / Restrictions Precautions Precautions: Fall Precaution Comments: airborne    Mobility  Bed Mobility Overal bed mobility: Needs Assistance Bed Mobility: Supine to Sit     Supine to sit: Supervision     General bed mobility comments: extra time due to pain on L and right sides of trunk, pt required no assistance just increased time  Transfers Overall transfer level: Needs assistance Equipment used: Rolling walker (2 wheeled) Transfers: Sit to/from Stand Sit to Stand: Min assist         General transfer comment: extra time to rise, cues for UE use/position  Ambulation/Gait Ambulation/Gait assistance: Min assist Ambulation Distance (Feet): 250 Feet Assistive device: Rolling walker (2 wheeled) Gait Pattern/deviations: Step-through pattern Gait velocity: WFL   General Gait Details: <25% VC's on safety using RW around obsticles.  Amb in hallway then assisted in/out bathroom.   Stairs            Wheelchair Mobility    Modified Rankin (Stroke Patients Only)       Balance                                     Cognition                            Exercises      General Comments        Pertinent Vitals/Pain C/o L side pain at shingle site    Home Living                      Prior Function            PT Goals (current goals can now be found in the care plan section) Progress towards PT goals: Progressing toward goals    Frequency  Min 3X/week    PT Plan      Co-evaluation             End of Session Equipment Utilized During Treatment: Gait belt Activity Tolerance: Patient tolerated treatment well Patient left: in chair;with call bell/phone within reach;with chair alarm set     Time: 1410-1442 PT Time Calculation (min): 32 min  Charges:  $Gait Training: 8-22 mins $Therapeutic Activity: 8-22 mins                    G Codes:      Felecia Shelling  PTA [facility]  Acute  Rehab Pager      [phone]

## 2014-04-01 LAB — BASIC METABOLIC PANEL
BUN: 22 mg/dL (ref 6–23)
CO2: 20 mEq/L (ref 19–32)
Calcium: 8.5 mg/dL (ref 8.4–10.5)
Chloride: 101 mEq/L (ref 96–112)
Creatinine, Ser: 1.16 mg/dL (ref 0.50–1.35)
GFR, EST AFRICAN AMERICAN: 65 mL/min — AB (ref >90)
GFR, EST NON AFRICAN AMERICAN: 56 mL/min — AB (ref >90)
GLUCOSE: 106 mg/dL — AB (ref 70–99)
POTASSIUM: 4.5 meq/L (ref 3.7–5.3)
Sodium: 131 mEq/L — ABNORMAL LOW (ref 137–147)

## 2014-04-01 LAB — CBC
HCT: 26 % — ABNORMAL LOW (ref 39.0–52.0)
HEMOGLOBIN: 9 g/dL — AB (ref 13.0–17.0)
MCH: 31.8 pg (ref 26.0–34.0)
MCHC: 34.6 g/dL (ref 30.0–36.0)
MCV: 91.9 fL (ref 78.0–100.0)
PLATELETS: 136 10*3/uL — AB (ref 150–400)
RBC: 2.83 MIL/uL — AB (ref 4.22–5.81)
RDW: 14.9 % (ref 11.5–15.5)
WBC: 7.7 10*3/uL (ref 4.0–10.5)

## 2014-04-01 MED ORDER — SPIRONOLACTONE 50 MG PO TABS: 50.0000 mg | ORAL_TABLET | Freq: Two times a day (BID) | ORAL | Status: DC

## 2014-04-01 MED ORDER — RIFAXIMIN 550 MG PO TABS: 550.0000 mg | ORAL_TABLET | Freq: Two times a day (BID) | ORAL | Status: DC

## 2014-04-01 MED ORDER — OXYCODONE HCL 5 MG PO TABS: 5.0000 mg | ORAL_TABLET | ORAL | Status: DC | PRN

## 2014-04-01 MED ORDER — FUROSEMIDE 20 MG PO TABS: 40.0000 mg | ORAL_TABLET | Freq: Every day | ORAL | Status: DC

## 2014-04-01 MED ORDER — LACTULOSE 10 GM/15ML PO SOLN: 30.0000 g | Freq: Three times a day (TID) | ORAL | Status: DC

## 2014-04-01 NOTE — Progress Notes (Signed)
CARE MANAGEMENT NOTE 04/01/2014  Patient:  Kirk [name], Kirk [name]   Account Number:  [id number]  Date Initiated:  03/28/2014  Documentation initiated by:  Ezekiel Ina  Subjective/Objective Assessment:   Pt admitted with fall, unsteady gait, ascites     Action/Plan:   from home   Anticipated DC Date:  04/01/2014   Anticipated DC Plan:  HOME W HOME HEALTH SERVICES      DC Planning Services  CM consult      [facility] Choice  HOME HEALTH   Choice offered to / List presented to:  C-3 Spouse        HH arranged  HH-1 RN  HH-2 PT      Status of service:   Medicare Important Message given?  NA - LOS <3 / Initial given by admissions (If response is "NO", the following Medicare IM given date fields will be blank) Date Medicare IM given:  03/27/2014 Date Additional Medicare IM given:    Discharge Disposition:  HOME W HOME HEALTH SERVICES  Per UR Regulation:  Reviewed for med. necessity/level of care/duration of stay  If discussed at Long Length of Stay Meetings, dates discussed:    Comments:  04/01/14 MMcGibboney, RN, BSN Pt's wife selected [facility] for HHRN/PT, referral given to in house rep.  03/31/14 MMcGibboney, RN, BSN Spoke with pt and wife concerning Home Health. List of Home Health Agencies given to wife.

## 2014-04-01 NOTE — Discharge Summary (Signed)
DISCHARGE SUMMARY  Kirk [name]  MR#: [id number]  DOB:[birth year]  Date of Admission: 03/27/2014 Date of Discharge: 04/01/2014  Attending Physician:Chiffon Kittleson A Jacky Kindle  Patient's WUJ:WJXBJYN,WGNFAOZ A, MD  Consults:  none  Discharge Diagnoses: Active Problems:   Cirrhosis of liver without mention of alcohol   Ascites   Hepatic encephalopathy   Chronic combined systolic and diastolic CHF (congestive heart failure)   Acute renal insufficiency   Protein-calorie malnutrition, severe   Dehydration   Subdural hematoma   Recurrent falls   Unsteady gait   Herpes zoster   Hyponatremia   Discharge Medications:   Medication List    STOP taking these medications       aspirin EC 81 MG tablet     valACYclovir 1000 MG tablet  Commonly known as:  VALTREX      TAKE these medications       escitalopram 10 MG tablet  Commonly known as:  LEXAPRO  Take 5 mg by mouth daily.     feeding supplement (ENSURE COMPLETE) Liqd  Take 237 mLs by mouth 3 (three) times daily between meals.     furosemide 20 MG tablet  Commonly known as:  LASIX  Take 2 tablets (40 mg total) by mouth daily.     hydrALAZINE 25 MG tablet  Commonly known as:  APRESOLINE  Take 25 mg by mouth 2 (two) times daily.     lactulose 10 GM/15ML solution  Commonly known as:  CHRONULAC  Take 45 mLs (30 g total) by mouth 3 (three) times daily.     oxyCODONE 5 MG immediate release tablet  Commonly known as:  Oxy IR/ROXICODONE  Take 1 tablet (5 mg total) by mouth every 4 (four) hours as needed for moderate pain.     pantoprazole 40 MG tablet  Commonly known as:  PROTONIX  TAKE 1 TABLET DAILY 30 MINUTES BEFORE A MEAL     rifaximin 550 MG Tabs tablet  Commonly known as:  XIFAXAN  Take 1 tablet (550 mg total) by mouth 2 (two) times daily.     spironolactone 50 MG tablet  Commonly known as:  ALDACTONE  Take 1 tablet (50 mg total) by mouth 2 (two) times daily.        Hospital Procedures: Dg Chest 2  View  03/27/2014   CLINICAL DATA:  Fall, bilateral hip pain  EXAM: CHEST  2 VIEW  COMPARISON:  11/21/2013  FINDINGS: Cardiomediastinal silhouette is stable. Status post median sternotomy. No acute infiltrate or pleural effusion. No pulmonary edema. Mild degenerative changes thoracic spine.  IMPRESSION: No active cardiopulmonary disease.   Electronically Signed   By: Natasha Mead M.D.   On: 03/27/2014 13:16   Dg Ribs Unilateral W/chest Left  03/28/2014   CLINICAL DATA:  Fall left-sided rib pain.  EXAM: LEFT RIBS AND CHEST - 3+ VIEW  COMPARISON:  Thoracic spine radiographs performed today. Chest radiograph 03/27/2014.  FINDINGS: There are changes of median sternotomy for CABG. Mild cardiomegaly is stable. Lung volumes are slightly low. No focal opacities or pleural effusions. Negative for pneumothorax.  No acute or healing left rib fracture identified.  IMPRESSION: No visible left rib fracture.  Cardiomegaly.  No acute findings in the chest.   Electronically Signed   By: Britta Mccreedy M.D.   On: 03/28/2014 11:57   Dg Thoracic Spine 2 View  03/28/2014   CLINICAL DATA:  Fall with left-sided pain. Evaluate for compression fracture.  EXAM: THORACIC SPINE - 2 VIEW  COMPARISON:  Chest  radiograph 03/27/2014  FINDINGS: There are changes of median sternotomy for CABG. Thoracic spine vertebral bodies are normal in height and alignment. There are mild degenerative changes of thoracic spine. No thoracic spine compression fracture is identified.  IMPRESSION: No acute bony abnormality identified.   Electronically Signed   By: Britta Mccreedy M.D.   On: 03/28/2014 11:55   Dg Lumbar Spine Complete  03/27/2014   CLINICAL DATA:  Status post fall now with low back and left hip pain. ; history of previous lumbar disk surgery  EXAM: LUMBAR SPINE - COMPLETE 4+ VIEW  COMPARISON:  None.  FINDINGS: The lumbar vertebral bodies are preserved in height. The intervertebral disc space heights are well maintained. The patient has undergone  laminectomy at L4 and possibly at L5. There is no spondylolisthesis. There is mild facet joint degenerative change in the mid and lower lumbar spine. The pedicles are intact. The transverse processes cannot be adequately assessed due to osteopenia and overlying stool and gas. There is mild degenerative change of the SI joints.  IMPRESSION: No acute abnormality of the lumbar spine is demonstrated. Mild degenerative changes are present.   Electronically Signed   By: David  [place]   On: 03/27/2014 13:25   Dg Elbow Complete Left  03/27/2014   CLINICAL DATA:  Pain post trauma  EXAM: LEFT ELBOW - COMPLETE 3+ VIEW  COMPARISON:  None.  FINDINGS: Frontal, lateral, and bilateral oblique views were obtained. There is no fracture, dislocation, or effusion. There is no appreciable joint space narrowing. There is a small exostosis arising from the distal lateral humeral metaphysis. Small calcifications in the volar aspect of the joint are probably of arthropathic etiology. No erosive change.  IMPRESSION: No fracture, dislocation, or effusion.   Electronically Signed   By: Bretta Bang M.D.   On: 03/27/2014 13:13   Dg Hip Complete Left  03/27/2014   CLINICAL DATA:  Fall, bilateral left greater than right hip pain  EXAM: LEFT HIP - COMPLETE 2+ VIEW  COMPARISON:  None.  FINDINGS: There is no evidence of hip fracture or dislocation. There is no evidence of arthropathy or other focal bone abnormality. Vascular calcifications are noted. Small amount of stool projects over the rectum, accounting for apparent lucency over the right symphysis pubis.  IMPRESSION: Negative.   Electronically Signed   By: Christiana Pellant M.D.   On: 03/27/2014 13:20   Dg Hip Complete Right  03/27/2014   CLINICAL DATA:  Right hip pain, fall  EXAM: RIGHT HIP - COMPLETE 2+ VIEW  COMPARISON:  None.  FINDINGS: There is no evidence of hip fracture or dislocation. There is no evidence of arthropathy or other focal bone abnormality. Vascular  calcifications are noted.  IMPRESSION: Negative.   Electronically Signed   By: Christiana Pellant M.D.   On: 03/27/2014 13:21   Ct Head Wo Contrast  03/27/2014   CLINICAL DATA:  Pain post trauma  EXAM: CT HEAD WITHOUT CONTRAST  CT CERVICAL SPINE WITHOUT CONTRAST  TECHNIQUE: Multidetector CT imaging of the head and cervical spine was performed following the standard protocol without intravenous contrast. Multiplanar CT image reconstructions of the cervical spine were also generated.  COMPARISON:  Chest CT November 21, 2013  FINDINGS: CT HEAD FINDINGS  The ventricles are normal in size and configuration. There is a subdural hematoma on the left which shows decreased attenuation, suggesting chronicity. These subdural hematoma on the left has a maximum thickness of 1.2 cm. There is impression on the left frontal, left  superior temporal, and left parietal lobes. There is modest midline shift toward the right of approximately 4 mm. There is no intra-axial hemorrhage. There is mild patchy small vessel disease in the centra semiovale bilaterally. No acute appearing infarct is seen. Bony calvarium appears intact. The mastoid air cells are clear.  CT CERVICAL SPINE FINDINGS  There is no fracture or spondylolisthesis. Prevertebral soft tissues and predental space regions are normal. There is moderate disc space narrowing at C5-6. There is mild disc space narrowing at C6-7. There is facet hypertrophy at multiple levels bilaterally. There is no disc extrusion or stenosis. There is calcification in both carotid arteries.  IMPRESSION: CT head: Subdural hematoma on the left which appears chronic based on the low attenuation of the fluid. There is mass effect and slight midline shift toward the right. There is patchy periventricular small vessel disease without evidence of acute infarct. No acute appearing hemorrhage is seen. No intra-axial hemorrhage seen.  CT cervical spine: No fracture or spondylolisthesis. Osteoarthritic change.  No disc extrusion or stenosis. Calcification in both carotid arteries.  Critical Value/emergent results were called by telephone at the time of interpretation on 03/27/2014 at 12:41 PM to Dr. Roxy HorsemanOBERT BROWNING , who verbally acknowledged these results.   Electronically Signed   By: Bretta BangWilliam  Woodruff M.D.   On: 03/27/2014 12:43   Ct Cervical Spine Wo Contrast  03/27/2014   CLINICAL DATA:  Pain post trauma  EXAM: CT HEAD WITHOUT CONTRAST  CT CERVICAL SPINE WITHOUT CONTRAST  TECHNIQUE: Multidetector CT imaging of the head and cervical spine was performed following the standard protocol without intravenous contrast. Multiplanar CT image reconstructions of the cervical spine were also generated.  COMPARISON:  Chest CT November 21, 2013  FINDINGS: CT HEAD FINDINGS  The ventricles are normal in size and configuration. There is a subdural hematoma on the left which shows decreased attenuation, suggesting chronicity. These subdural hematoma on the left has a maximum thickness of 1.2 cm. There is impression on the left frontal, left superior temporal, and left parietal lobes. There is modest midline shift toward the right of approximately 4 mm. There is no intra-axial hemorrhage. There is mild patchy small vessel disease in the centra semiovale bilaterally. No acute appearing infarct is seen. Bony calvarium appears intact. The mastoid air cells are clear.  CT CERVICAL SPINE FINDINGS  There is no fracture or spondylolisthesis. Prevertebral soft tissues and predental space regions are normal. There is moderate disc space narrowing at C5-6. There is mild disc space narrowing at C6-7. There is facet hypertrophy at multiple levels bilaterally. There is no disc extrusion or stenosis. There is calcification in both carotid arteries.  IMPRESSION: CT head: Subdural hematoma on the left which appears chronic based on the low attenuation of the fluid. There is mass effect and slight midline shift toward the right. There is patchy  periventricular small vessel disease without evidence of acute infarct. No acute appearing hemorrhage is seen. No intra-axial hemorrhage seen.  CT cervical spine: No fracture or spondylolisthesis. Osteoarthritic change. No disc extrusion or stenosis. Calcification in both carotid arteries.  Critical Value/emergent results were called by telephone at the time of interpretation on 03/27/2014 at 12:41 PM to Dr. Roxy HorsemanOBERT BROWNING , who verbally acknowledged these results.   Electronically Signed   By: Bretta BangWilliam  Woodruff M.D.   On: 03/27/2014 12:43   [place] Paracentesis  03/31/2014   CLINICAL DATA:  Cirrhosis, recurrent abdominal ascites  EXAM: ULTRASOUND GUIDED PARACENTESIS  TECHNIQUE: The procedure, risks (including but  not limited to bleeding, infection, organ damage ), benefits, and alternatives were explained to the patient. Questions regarding the procedure were encouraged and answered. The patient understands and consents to the procedure. Survey ultrasound of the abdomen was performed and an appropriate skin entry site in the abdomen was selected. Skin site was marked, prepped with Betadine, and draped in usual sterile fashion, and infiltrated locally with 1% lidocaine. A 5 French multisidehole Yueh sheath needle was advanced into the peritoneal space until fluid could be aspirated. The sheath was advanced and the needle removed. 4 L of clear yellowascites were aspirated. Samples sent for the requested laboratory studies. No immediate complication.  IMPRESSION: Technically successful ultrasound guided paracentesis, removing 4 L of ascites.   Electronically Signed   By: Oley Balm M.D.   On: 03/31/2014 05:22   Dg Abd 2 Views  03/29/2014   CLINICAL DATA:  Abdominal pain  EXAM: ABDOMEN - 2 VIEW  COMPARISON:  CT, 08/06/2012.  FINDINGS: There is mild colonic distention with moderate increased colonic stool. There is no evidence of obstruction. There is no free air.  Soft tissues are not well defined. Bony  structures are demineralized.  IMPRESSION: 1. No acute findings. 2. Moderate increased stool in the colon.   Electronically Signed   By: Amie Portland M.D.   On: 03/29/2014 14:44   Dg Hand Complete Left  03/27/2014   CLINICAL DATA:  Pain post trauma  EXAM: LEFT HAND - COMPLETE 3+ VIEW  COMPARISON:  None.  FINDINGS: Frontal, oblique, and lateral views were obtained. There is a small metallic foreign body in the soft tissues adjacent to the first metacarpal along the lateral aspect. There is evidence of an old fracture along the proximal first metacarpal which shows remodeling. There is no demonstrable acute fracture or dislocation. There is osteoarthritic change in all MCP, PIP, and DIP joints. There is also narrowing of the radiocarpal joint. No erosive change.  IMPRESSION: Small metallic foreign body slightly lateral to the proximal first metacarpal. Evidence of an old healed fracture of the first metacarpal. There is multilevel osteoarthritic change. No acute fracture apparent. No dislocation.   Electronically Signed   By: Bretta Bang M.D.   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 His wife reports that he has had several falls over the past several weeks. He describes some lightheadedness and dizziness when standing. He had 2 falls recently when trying to walk outdoors. This morning around 2:30 AM he fell in the bathroom his wife heard the fall. She came to his assistance and found that he was too weak to get up. EMS was called and was brought to the emergency department where he underwent an extensive evaluation with x-rays of the lumbar spine, left elbow, hand, bilateral, chest x-ray and CT of the head and cervical spine. There is no evidence of fracture but CT did  reveal a chronic appearing subdural with mild midline shift. Laboratory evaluation showed hyponatremia, hyperkalemia, and elevated BUN and creatinine. His wife states that he has had increasing doses of diuretics recently due to increased ascites and lower extremity edema. He has generally been compliant with his lactulose but has not been having more than one bowel movement per day. His last bowel movement was 2 days ago. He does miss  doses occasionally despite his wife's admonition. Wife does not feel that he's had significant confusion. He really does not like taking his lactulose due to some nausea. They have a prescription in supply of Xifaxin at home but have not been taking it due to concern with cost. They're willing to reconsider due to his aversion to lactulose. Over the past 2-3 weeks he has become progressively weak. This was compounded by a recent zoster outbreak on his right abdomen. This has improved with Valtrex that he started 3 days ago. At this point, the family feels that he is too weak to return home. He is being admitted for dehydration, increased confusion, and gait instability.      Hospital Course: Patient was omitted as above and in retrospect mostly decompensated hepatic failure with underlying-nash was likely precipitated by intractable pain and zoster as well as fall with skeletal pain. He remains unclear as to whether the incidental subdural contribute in any way to this. Regardless, from zoster standpoint he did complete a course of Valtrex and lesions currently scabbed over with mild residual pain. From a cognitive standpoint with his elevated ammonia between bolstering his regimen and time he is returned to normal and is currently working with therapy ambulating with assistance. He went take remains poor but he is taking supplements as nothing more we can do with this regards. He did undergo a paracentesis looking for occult peritonitis this workup was negative and while he  does have residual ascites as expected this is manageable. His lower [place] edema is improved but it is anticipated this will again worsen when he is more ambulatory this having been said he is well compensated on his current dose of diuretics with stable electrolytes. He's had no evidence of underlying smoldering infection. Cognitively he is back to baseline. He remains weak and debilitated with a continued decline expected. He is as good as current hospitalization will allow in maximal hospital benefit has been achieved.  Day of Discharge Exam BP 116/53  Pulse 73  Temp(Src) 98 F (36.7 C) (Oral)  Resp 18  Ht 5\' 7"  (1.702 m)  Wt 51.5 kg (113 lb 8.6 oz)  BMI 17.78 kg/m2  SpO2 96%  Physical Exam: General appearance: alert, cooperative and no distress Eyes: no scleral icterus Throat: oropharynx moist without erythema Resp: clear to auscultation bilaterally Cardio: regular rate and rhythm Extremities: no clubbing, cyanosis or edema Abdomen is nontender ascites is present Zoster lesions right hemithorax scabbed Neurologically moves extremities x4 cognitively intact diffusely weak ambulating with assistance nonlateralizing  Discharge Labs:  Recent Labs  03/30/14 0900 03/31/14 0430 04/01/14 0354  NA  --  133* 131*  K  --  4.8 4.5  CL  --  103 101  CO2  --  21 20  GLUCOSE  --  113* 106*  BUN  --  17 22  CREATININE  --  1.05 1.16  CALCIUM  --  8.4 8.5  MG 2.2  --   --    No results found for this basename: AST, ALT, ALKPHOS, BILITOT, PROT, ALBUMIN,  in the last 72 hours  Recent Labs  03/31/14 0430 04/01/14 0354  WBC 8.2 7.7  HGB 9.0* 9.0*  HCT 27.1* 26.0*  MCV 93.1 91.9  PLT 127* 136*   No results found for this basename: CKTOTAL, CKMB, CKMBINDEX, TROPONINI,  in the last 72 hours No results found for this basename: TSH, T4TOTAL, FREET3, T3FREE, THYROIDAB,  in the last 72 hours No results found for  this basename: VITAMINB12, FOLATE, FERRITIN, TIBC, IRON, RETICCTPCT,  in  the last 72 hours  Discharge instructions:     Discharge Instructions   Diet - low sodium heart healthy    Complete by:  As directed      Increase activity slowly    Complete by:  As directed            Disposition: Home with home health  Follow-up Appts: Follow-up with Dr. Jacky Kindle at [facility] in 1 week.  Call for appointment.  Condition on Discharge: Stable  Tests Needing Follow-up: None  Signed: Minda Meo 04/01/2014, 6:54 AM

## 2014-04-02 LAB — BODY FLUID CULTURE
Culture: NO GROWTH
Special Requests: NORMAL

## 2014-05-14 ENCOUNTER — Other Ambulatory Visit: Payer: Self-pay | Admitting: Internal Medicine

## 2014-05-14 DIAGNOSIS — K745 Biliary cirrhosis, unspecified: Secondary | ICD-10-CM

## 2014-05-14 DIAGNOSIS — R188 Other ascites: Secondary | ICD-10-CM

## 2014-05-18 ENCOUNTER — Ambulatory Visit (HOSPITAL_COMMUNITY)
Admission: RE | Admit: 2014-05-18 | Discharge: 2014-05-18 | Disposition: A | Discharge status: Home / Self Care | Payer: Medicare Other | Source: Ambulatory Visit | Attending: Internal Medicine | Admitting: Internal Medicine

## 2014-05-18 DIAGNOSIS — K7460 Unspecified cirrhosis of liver: Secondary | ICD-10-CM | POA: Insufficient documentation

## 2014-05-18 DIAGNOSIS — K745 Biliary cirrhosis, unspecified: Secondary | ICD-10-CM

## 2014-05-18 DIAGNOSIS — R188 Other ascites: Secondary | ICD-10-CM

## 2014-05-18 NOTE — Progress Notes (Signed)
Quick Note:  I did not order this that I know He cancelled March appt Please get an update and schedule f/u if appropriate ______

## 2014-05-18 NOTE — Procedures (Signed)
Successful US guided paracentesis from RLQ.  Yielded 3.1 liters of clear yellow fluid.  No immediate complications.  Pt tolerated well.   Specimen was not sent for labs.  Pattricia BossMORGAN, Monetta Lick D PA-C 05/18/2014 2:53 PM

## 2014-05-21 ENCOUNTER — Encounter: Payer: Self-pay | Admitting: Internal Medicine

## 2014-06-24 ENCOUNTER — Telehealth: Payer: Self-pay | Admitting: Internal Medicine

## 2014-06-24 NOTE — Telephone Encounter (Signed)
Dr. Jacky KindleAronson would like me to see Mr. Kirk [name] re: follow-up liver disease, ? Any other Tx

## 2014-06-25 ENCOUNTER — Emergency Department (HOSPITAL_COMMUNITY): Payer: Medicare Other

## 2014-06-25 ENCOUNTER — Emergency Department (HOSPITAL_COMMUNITY)
Admission: EM | Admit: 2014-06-25 | Discharge: 2014-06-25 | Disposition: A | Discharge status: Home / Self Care | Payer: Medicare Other | Attending: Emergency Medicine | Admitting: Emergency Medicine

## 2014-06-25 ENCOUNTER — Encounter (HOSPITAL_COMMUNITY): Payer: Self-pay | Admitting: Emergency Medicine

## 2014-06-25 DIAGNOSIS — I5022 Chronic systolic (congestive) heart failure: Secondary | ICD-10-CM | POA: Insufficient documentation

## 2014-06-25 DIAGNOSIS — M129 Arthropathy, unspecified: Secondary | ICD-10-CM | POA: Insufficient documentation

## 2014-06-25 DIAGNOSIS — I6200 Nontraumatic subdural hemorrhage, unspecified: Secondary | ICD-10-CM | POA: Diagnosis not present

## 2014-06-25 DIAGNOSIS — Z79899 Other long term (current) drug therapy: Secondary | ICD-10-CM | POA: Diagnosis not present

## 2014-06-25 DIAGNOSIS — K769 Liver disease, unspecified: Secondary | ICD-10-CM | POA: Insufficient documentation

## 2014-06-25 DIAGNOSIS — I252 Old myocardial infarction: Secondary | ICD-10-CM | POA: Diagnosis not present

## 2014-06-25 DIAGNOSIS — I10 Essential (primary) hypertension: Secondary | ICD-10-CM | POA: Insufficient documentation

## 2014-06-25 DIAGNOSIS — F3289 Other specified depressive episodes: Secondary | ICD-10-CM | POA: Diagnosis not present

## 2014-06-25 DIAGNOSIS — K219 Gastro-esophageal reflux disease without esophagitis: Secondary | ICD-10-CM | POA: Insufficient documentation

## 2014-06-25 DIAGNOSIS — Z9849 Cataract extraction status, unspecified eye: Secondary | ICD-10-CM | POA: Diagnosis not present

## 2014-06-25 DIAGNOSIS — IMO0002 Reserved for concepts with insufficient information to code with codable children: Secondary | ICD-10-CM | POA: Diagnosis not present

## 2014-06-25 DIAGNOSIS — I2510 Atherosclerotic heart disease of native coronary artery without angina pectoris: Secondary | ICD-10-CM | POA: Diagnosis not present

## 2014-06-25 DIAGNOSIS — S065X9A Traumatic subdural hemorrhage with loss of consciousness of unspecified duration, initial encounter: Secondary | ICD-10-CM

## 2014-06-25 DIAGNOSIS — Z880 Allergy status to penicillin: Secondary | ICD-10-CM | POA: Diagnosis not present

## 2014-06-25 DIAGNOSIS — K7290 Hepatic failure, unspecified without coma: Secondary | ICD-10-CM

## 2014-06-25 DIAGNOSIS — F329 Major depressive disorder, single episode, unspecified: Secondary | ICD-10-CM | POA: Insufficient documentation

## 2014-06-25 DIAGNOSIS — R4182 Altered mental status, unspecified: Secondary | ICD-10-CM | POA: Insufficient documentation

## 2014-06-25 DIAGNOSIS — S065XAA Traumatic subdural hemorrhage with loss of consciousness status unknown, initial encounter: Secondary | ICD-10-CM

## 2014-06-25 DIAGNOSIS — F17200 Nicotine dependence, unspecified, uncomplicated: Secondary | ICD-10-CM | POA: Diagnosis not present

## 2014-06-25 DIAGNOSIS — Z951 Presence of aortocoronary bypass graft: Secondary | ICD-10-CM | POA: Insufficient documentation

## 2014-06-25 DIAGNOSIS — Z9861 Coronary angioplasty status: Secondary | ICD-10-CM | POA: Insufficient documentation

## 2014-06-25 DIAGNOSIS — F411 Generalized anxiety disorder: Secondary | ICD-10-CM | POA: Diagnosis not present

## 2014-06-25 DIAGNOSIS — K7210 Chronic hepatic failure without coma: Secondary | ICD-10-CM

## 2014-06-25 LAB — CBC
HEMATOCRIT: 27.3 % — AB (ref 39.0–52.0)
Hemoglobin: 9.3 g/dL — ABNORMAL LOW (ref 13.0–17.0)
MCH: 30.9 pg (ref 26.0–34.0)
MCHC: 34.1 g/dL (ref 30.0–36.0)
MCV: 90.7 fL (ref 78.0–100.0)
Platelets: 156 10*3/uL (ref 150–400)
RBC: 3.01 MIL/uL — AB (ref 4.22–5.81)
RDW: 15.4 % (ref 11.5–15.5)
WBC: 8.9 10*3/uL (ref 4.0–10.5)

## 2014-06-25 LAB — URINALYSIS, ROUTINE W REFLEX MICROSCOPIC
Bilirubin Urine: NEGATIVE
Glucose, UA: NEGATIVE mg/dL
Hgb urine dipstick: NEGATIVE
KETONES UR: NEGATIVE mg/dL
LEUKOCYTES UA: NEGATIVE
NITRITE: NEGATIVE
PH: 5.5 (ref 5.0–8.0)
Protein, ur: NEGATIVE mg/dL
Specific Gravity, Urine: 1.015 (ref 1.005–1.030)
UROBILINOGEN UA: 1 mg/dL (ref 0.0–1.0)

## 2014-06-25 LAB — COMPREHENSIVE METABOLIC PANEL
ALT: 22 U/L (ref 0–53)
AST: 49 U/L — ABNORMAL HIGH (ref 0–37)
Albumin: 2.2 g/dL — ABNORMAL LOW (ref 3.5–5.2)
Alkaline Phosphatase: 199 U/L — ABNORMAL HIGH (ref 39–117)
Anion gap: 9 (ref 5–15)
BUN: 35 mg/dL — ABNORMAL HIGH (ref 6–23)
CO2: 24 mEq/L (ref 19–32)
Calcium: 8.4 mg/dL (ref 8.4–10.5)
Chloride: 99 mEq/L (ref 96–112)
Creatinine, Ser: 1.68 mg/dL — ABNORMAL HIGH (ref 0.50–1.35)
GFR calc Af Amer: 41 mL/min — ABNORMAL LOW (ref >90)
GFR calc non Af Amer: 35 mL/min — ABNORMAL LOW (ref >90)
Glucose, Bld: 107 mg/dL — ABNORMAL HIGH (ref 70–99)
Potassium: 5.1 mEq/L (ref 3.7–5.3)
Sodium: 132 mEq/L — ABNORMAL LOW (ref 137–147)
Total Bilirubin: 0.9 mg/dL (ref 0.3–1.2)
Total Protein: 7 g/dL (ref 6.0–8.3)

## 2014-06-25 LAB — PROTIME-INR
INR: 1.32 (ref 0.00–1.49)
PROTHROMBIN TIME: 16.4 s — AB (ref 11.6–15.2)

## 2014-06-25 LAB — AMMONIA: Ammonia: 80 umol/L — ABNORMAL HIGH (ref 11–60)

## 2014-06-25 LAB — APTT: aPTT: 38 seconds — ABNORMAL HIGH (ref 24–37)

## 2014-06-25 MED ORDER — SODIUM CHLORIDE 0.9 % IV BOLUS (SEPSIS)
1000.0000 mL | Freq: Once | INTRAVENOUS | Status: AC
  Administered 2014-06-25: 1000 mL via INTRAVENOUS

## 2014-06-25 MED ORDER — LACTULOSE 10 GM/15ML PO SOLN
20.0000 g | Freq: Once | ORAL | Status: AC
  Administered 2014-06-25: 20 g via ORAL
  Filled 2014-06-25: qty 30

## 2014-06-25 NOTE — ED Provider Notes (Signed)
CSN: [id number]     Arrival date & time 06/25/14  0901 History   First MD Initiated Contact with Patient 06/25/14 [phone]     Chief Complaint  Patient presents with  . Altered Mental Status     (Consider location/radiation/quality/duration/timing/severity/associated sxs/prior Treatment) HPI A LEVEL 5 CAVEAT PERTAINS DUE TO ALTERED MENTAL STATUS Pt with hx of end stage liver disease presents with c/o altered mental status.  Per family over the past 2 days he has had worsening confusion.  No fever/chills.  No vomiting, has had overall decreased appetite.  He fell 10 days ago- unknown whether he hit his head.    Past Medical History  Diagnosis Date  . GERD (gastroesophageal reflux disease)   . Diverticulosis     s/p partial colon resection  . CAD (coronary artery disease)     s/p MI 1999;  s/p CABG 2005 (L-LAD, S-Dx, S-OM1, S-PDA); patient had post op AFib;  Myoview 8/10: mild fixed lateral defect; no ischemia, EF 47%  . Hyperlipemia   . Ischemic cardiomyopathy     echo 1/11: mild MR, mild LAE, EF 40-45%;  echo 10/12: EF 40-45%, IL AK, Post AK, grade 2 diast dysfxn, mild to mod MR, mild BAE, PASP 38;   Echo 9/14:  Mild focal basal septal hypertrophy, EF 40-45%, IL, inf and post HK, Gr 1 DD, MAC, mild MR, mild LAE, mild RVE, mild RAE, PASP 32, L pleural effusion  . Hypertension   . Arthritis   . Common bile duct dilatation   . Pancreatitis     post-ERCP 11/2009  . Colitis, ischemic   . Chronic systolic heart failure     Echo 09/05/11: mild focal basal septal hypertrophy, EF 40-45%, basal to mid IL AK, post AK, grade 2 diast dysfxn, mild to mod MR, mild BAE, PASP 38  . Atrial fibrillation     post CABG in 2005 and in setting of acute illness in 1/11 (post ERCP pancreatitis) - coumadin not felt to be indicated  . Lumbar spondylosis   . Elevated LFTs     statin d/c'd in past due to   . BPH (benign prostatic hyperplasia)   . Sinus bradycardia     on beta blockers  . Ulcer 10/2012    in  pylorus  . Allergy   . Anemia   . Diverticulitis     with partial colectomy  . Cirrhosis     Negative workup for viral hepatitis, autoimmune hepatitis, hemochromatosis. Never heavy ETOH. Suspect NASH. EGD (12/13) with esophageal varices and portal hypertensive gastropathy. EGD (3/14) with 3 small esophageal varices, portal hypertensive gastropathy.  . Myocardial infarction 1991; 1999  . Esophageal varices in cirrhosis     Hattie Perch/notes 08/04/2013 (08/05/2013)  . Depression   . Anxiety   . Hyperkalemia requiring hospitalization 08/04/2013  . Cirrhosis    Past Surgical History  Procedure Laterality Date  . Colon resection      "removed 12-15' of colon" (08/05/2013)  . Back surgery    . Ercp  11/13/2009    dilated bile ducts, atypical cytology, repeat attempt normal pancreatogram but unsuccessful biliary cannulation  . Angioplasty  1999  . Shoulder open rotator cuff repair Bilateral     "I've had a total of 3 OR's for this" (08/05/2013)  . Cataract extraction w/ intraocular lens implant Right   . Esophagogastroduodenoscopy    . Flexible sigmoidoscopy    . Colonoscopy    . Tonsillectomy    . Coronary angioplasty with stent  placement      "before the bypass" (08/05/2013)  . Coronary artery bypass graft  2005    "CABG X7" (08/05/2013)x 4  . Lumbar disc surgery     Family History  Problem Relation Age of Onset  . Diabetes Mother   . Heart disease Mother   . Congestive Heart Failure Mother   . Lung cancer Father   . Colon cancer Neg Hx   . Esophageal cancer Neg Hx   . Rectal cancer Neg Hx   . Stomach cancer Neg Hx    History  Substance Use Topics  . Smoking status: Current Every Day Smoker -- 68 years    Types: Cigars  . Smokeless tobacco: Never Used     Comment: 08/05/2013 "probably 3 cigars/day"; form given 10-08-13  . Alcohol Use: No    Review of Systems UNABLE TO OBTAIN ROS DUE TO LEVEL 5 CAVEAT    Allergies  Erythromycin and Penicillins  Home Medications   Prior to  Admission medications   Medication Sig Start Date End Date Taking? Authorizing Provider  escitalopram (LEXAPRO) 10 MG tablet Take 5 mg by mouth daily.    Yes Historical Provider, MD  furosemide (LASIX) 20 MG tablet Take 40 mg by mouth daily.   Yes Historical Provider, MD  hydrALAZINE (APRESOLINE) 25 MG tablet Take 25 mg by mouth 2 (two) times daily.   Yes Historical Provider, MD  hyoscyamine (LEVSIN SL) 0.125 MG SL tablet Place 0.125 mg under the tongue every 4 (four) hours as needed for cramping.   Yes Historical Provider, MD  lactulose (CHRONULAC) 10 GM/15ML solution Take 10 g by mouth daily.   Yes Historical Provider, MD  ondansetron (ZOFRAN) 4 MG tablet Take 4 mg by mouth every 6 (six) hours as needed for nausea or vomiting.   Yes Historical Provider, MD  oxyCODONE (OXY IR/ROXICODONE) 5 MG immediate release tablet Take 5 mg by mouth every 4 (four) hours as needed for moderate pain or severe pain.   Yes Historical Provider, MD  pantoprazole (PROTONIX) 40 MG tablet Take 40 mg by mouth daily.   Yes Historical Provider, MD  rifaximin (XIFAXAN) 550 MG TABS tablet Take 550 mg by mouth 2 (two) times daily.   Yes Historical Provider, MD  silver sulfADIAZINE (SILVADENE) 1 % cream Apply 1 application topically daily.   Yes Historical Provider, MD  spironolactone (ALDACTONE) 50 MG tablet Take 50 mg by mouth 2 (two) times daily.   Yes Historical Provider, MD  triamcinolone cream (KENALOG) 0.5 % Apply 1 application topically 2 (two) times daily.   Yes Historical Provider, MD   BP 117/54  Pulse 69  Temp(Src) 98.3 F (36.8 C) (Oral)  Resp 16  SpO2 96% Vitals reviewed Physical Exam Physical Examination: General appearance - alert, well appearing, and in no distress Mental status - alert, oriented to person,  Not to place or time Eyes - pupils equal and reactive, no scleral icterus, no conjunctival injection Mouth - mucous membranes moist, pharynx normal without lesions Chest - clear to auscultation,  no wheezes, rales or rhonchi, symmetric air entry Heart - normal rate, regular rhythm, normal S1, S2, no murmurs, rubs, clicks or gallops Abdomen - soft, nontender, nondistended, no masses or organomegaly, nabs Extremities - peripheral pulses normal, no pedal edema, no clubbing or cyanosis Skin - normal coloration and turgor, no rashes  ED Course  Procedures (including critical care time)  2:00 PM there have been multiple and ongoing issues with getting patient's lab results.  Have now received ammonia level- elevated at 80.  They are redrawing CMP now.  Lactulose ordered.   2:18 PM d/w Dr. Lorain Childes, he knows this patient well.  States he is end stage liver disease and there is not much more in the way of treatment that will help him.  He does request head CT to be sure of no brain bleed.  Will obtain this- wife in the past has not wanted to have him placed in nursing facility.  Will talk with her after head CT and if she is wanting him to be placed, Dr. Lorain Childes is willing to come admit.    3:44 PM pt has had subdural fluid collection since may 2015, today there appears to be new blood in this area.  No midline shift or overall change in size of area.  Discussed with dr. Lorain Childes, will contact neurosurgery.  If neurosurgery has recommendations then Lorain Childes will admit.  If not, then patient to be discharged.  I have updated wife.  She is concerned about being able to take care of him at home, but does not feel he would live long if placed in a facility so she does not wish to pursue this.    3:52 PM d/w Dr. Venetia Maxon, neurosugery, he has reviewed the CT there is very little change- no change in size or midline shift.  He does not recommend any intervention.  Therefore- will discharge patient to home for followup with Dr. Lorain Childes.   Labs Review Labs Reviewed  CBC - Abnormal; Notable for the following:    RBC 3.01 (*)    Hemoglobin 9.3 (*)    HCT 27.3 (*)    All other components within normal  limits  APTT - Abnormal; Notable for the following:    aPTT 38 (*)    All other components within normal limits  PROTIME-INR - Abnormal; Notable for the following:    Prothrombin Time 16.4 (*)    All other components within normal limits  AMMONIA - Abnormal; Notable for the following:    Ammonia 80 (*)    All other components within normal limits  COMPREHENSIVE METABOLIC PANEL - Abnormal; Notable for the following:    Sodium 132 (*)    Glucose, Bld 107 (*)    BUN 35 (*)    Creatinine, Ser 1.68 (*)    Albumin 2.2 (*)    AST 49 (*)    Alkaline Phosphatase 199 (*)    GFR calc non Af Amer 35 (*)    GFR calc Af Amer 41 (*)    All other components within normal limits  URINALYSIS, ROUTINE W REFLEX MICROSCOPIC    Imaging Review Ct Head Wo Contrast  06/25/2014   CLINICAL DATA:  Altered mental status and confusion.  EXAM: CT HEAD WITHOUT CONTRAST  TECHNIQUE: Contiguous axial images were obtained from the base of the skull through the vertex without contrast.  COMPARISON:  03/27/2014 and 11/11/2013  FINDINGS: Again noted is a left subdural collection along the left frontal and parietal convexities. Previously, this subdural collection was low-density but now this collection has scattered areas of high density suggesting blood products. The overall size of the subdural collection has not significantly changed since 03/27/2014. There is left-to-right midline shift roughly measuring 4 mm but this is unchanged from the prior examination. Again noted are patchy areas of low density throughout the periventricular and subcortical white matter suggesting chronic changes. There is no evidence for hydrocephalus or large infarct. The visualized paranasal sinuses  are clear. No acute bone abnormality.  IMPRESSION: There is a left extra-axial collection consistent with an acute-on-chronic subdural hematoma. The size of the subdural collection is unchanged since 03/27/2014 but there has been interval bleeding into  the collection. The degree of midline shift is unchanged. No evidence for hydrocephalus.  Atrophy and evidence for chronic small vessel ischemic changes.  Critical Value/emergent results were called by telephone at the time of interpretation on 06/25/2014 at 3:08 pm to Dr. Jerelyn Scott , who verbally acknowledged these results.   Electronically Signed   By: Richarda Overlie M.D.   On: 06/25/2014 15:12     EKG Interpretation   Date/Time:  Thursday June 25 2014 09:13:30 EDT Ventricular Rate:  78 PR Interval:  137 QRS Duration: 105 QT Interval:  385 QTC Calculation: 438 R Axis:   13 Text Interpretation:  Sinus rhythm Multiple premature complexes, vent   Since previous tracing PVC is new Confirmed by Karma Ganja  MD, Jordann Grime [phone])  on 06/25/2014 10:47:04 AM      MDM   Final diagnoses:  Altered mental status, unspecified altered mental status type  End stage liver disease  Subdural hematoma    Pt with increased altered mental status- see notes above, he has chronically high ammonia on lactulose at home.  Subdural fluid collection- now with some blood- per neurosurgery there is no recommneded intervention.  Long discussion with family and patietns primary doctor, pt to be discharged home.  Wife is in understanding of this- will f/u with Dr. Jacky Kindle and let him know when and if she is ready to have him placed in a nursing facility.      Ethelda Chick, MD 06/27/14 1056

## 2014-06-25 NOTE — Telephone Encounter (Signed)
Spoke with patients wife Windell MouldingRuth and set up appointment for 06/30/14 at 2:30pm.

## 2014-06-25 NOTE — Discharge Instructions (Signed)
Return to the ED with any concerns including fever/chills, vomiting and not able to keep down liquids, fainting, or any other alarming symptoms

## 2014-06-25 NOTE — ED Notes (Signed)
Per family patient has had confusion for a few days. History of high ammonia levels. Patient alert and oriented to situation and self but not alert to time. Patient able to move extremities freely. Denies pain. Patient uses cain at home to walk. Wife reports recent multiple falls.

## 2014-06-25 NOTE — ED Notes (Signed)
Family at bedside.  Mouth swabs given to help moisten mouth.  Pt resting and appears comfortable otherwise.

## 2014-06-25 NOTE — ED Notes (Signed)
Pt brief changed. Peri care done.

## 2014-06-25 NOTE — ED Notes (Signed)
Per wife patient has had decreased appetite over the last few days with nausea.

## 2014-06-25 NOTE — ED Notes (Signed)
Patient states he is hurting around his shoulder blades in his back. Per wife, she thinks this is due to his recent falls.

## 2014-06-25 NOTE — ED Notes (Signed)
Patient assisted to provide clean catch. Brief applied afterwards. Previous brief saturated with urine.

## 2014-06-25 NOTE — ED Notes (Signed)
Patient resting comfortably. Fluid bolus hung. Vitals within normal limits. Will continue to monitor.

## 2014-06-30 ENCOUNTER — Encounter: Payer: Self-pay | Admitting: Internal Medicine

## 2014-06-30 ENCOUNTER — Ambulatory Visit (INDEPENDENT_AMBULATORY_CARE_PROVIDER_SITE_OTHER): Payer: Medicare Other | Admitting: Internal Medicine

## 2014-06-30 VITALS — BP 120/60 | HR 80 | Ht 66.0 in | Wt 118.5 lb

## 2014-06-30 DIAGNOSIS — I2510 Atherosclerotic heart disease of native coronary artery without angina pectoris: Secondary | ICD-10-CM

## 2014-06-30 DIAGNOSIS — E43 Unspecified severe protein-calorie malnutrition: Secondary | ICD-10-CM

## 2014-06-30 DIAGNOSIS — R188 Other ascites: Secondary | ICD-10-CM

## 2014-06-30 DIAGNOSIS — K315 Obstruction of duodenum: Secondary | ICD-10-CM

## 2014-06-30 DIAGNOSIS — K7460 Unspecified cirrhosis of liver: Secondary | ICD-10-CM

## 2014-06-30 DIAGNOSIS — I6200 Nontraumatic subdural hemorrhage, unspecified: Secondary | ICD-10-CM

## 2014-06-30 DIAGNOSIS — S065XAA Traumatic subdural hemorrhage with loss of consciousness status unknown, initial encounter: Secondary | ICD-10-CM

## 2014-06-30 DIAGNOSIS — S065X9A Traumatic subdural hemorrhage with loss of consciousness of unspecified duration, initial encounter: Secondary | ICD-10-CM

## 2014-06-30 NOTE — Assessment & Plan Note (Signed)
I think causing early satiety problems and related to nausea/anorexia Therapeutic paracentesis RTC 1 month Will need to f/u creat ? CHF contribution

## 2014-06-30 NOTE — Assessment & Plan Note (Signed)
Using Ensure when he can take it Hopefully paracentesis will relieve early satiety If it doesn't need top consider rhat duodenal stricture a problem again

## 2014-06-30 NOTE — Assessment & Plan Note (Signed)
May need a f/u to see what effect this is having though at his age and with life expectancy not sure

## 2014-06-30 NOTE — Progress Notes (Signed)
   Subjective:    Patient ID: Kirk [name], male    DOB: January 11, 1927, 80 y.o.   MRN: [id number]  HPI Here for f/u with wife. Kirk [name] has been declining and has had 3 paracenteses this year. He was admitted in May with a fall and subdural hematoma and hepatic encephalopathy. Fall again 8/20 - ED CT subdural hematoma with some increased bleeding  Fell in woods over weekend - no trauma  + early satiety, anorexia Wt Readings from Last 3 Encounters:  06/30/14 118 lb 8 oz (53.751 kg)  04/01/14 113 lb 8.6 oz (51.5 kg)  12/05/13 126 lb (57.153 kg)   Having some intermittent confusion but lately better per wife   Review of Systems As above    Objective:   Physical Exam General:  Thin, chronically ill Eyes:  anicteric. Lungs: Clear to auscultation bilaterally. Heart:  S1S2, no rubs, murmurs, gallops. + Afib rhythm Abdomen:  mildly tight + fluid wave ++++ abdominal wall vv, non-tender, no hepatosplenomegaly, hernia, or mass and BS+.   Extremities:   1+ Lower edema Skin   no rash. Neuro:  A&O x 3.  Psych:  appropriate mood and  Affect. No asterixis   Data Reviewed:  As per HPI Labs in EMR Labs from Dr. Jacky Kindle Lab Results  Component Value Date   WBC 8.9 06/25/2014   HGB 9.3* 06/25/2014   HCT 27.3* 06/25/2014   MCV 90.7 06/25/2014   PLT 156 06/25/2014     Chemistry      Component Value Date/Time   NA 132* 06/25/2014 1339   K 5.1 06/25/2014 1339   CL 99 06/25/2014 1339   CO2 24 06/25/2014 1339   BUN 35* 06/25/2014 1339   CREATININE 1.68* 06/25/2014 1339      Component Value Date/Time   CALCIUM 8.4 06/25/2014 1339   ALKPHOS 199* 06/25/2014 1339   AST 49* 06/25/2014 1339   ALT 22 06/25/2014 1339   BILITOT 0.9 06/25/2014 1339     Lab Results  Component Value Date   INR 1.32 06/25/2014   INR 1.30 11/22/2013   INR 1.27 11/21/2013         Assessment & Plan:  Cirrhosis of liver without mention of alcohol Worsening   Ascites I think causing early satiety problems and  related to nausea/anorexia Therapeutic paracentesis RTC 1 month Will need to f/u creat ? CHF contribution   Subdural hematoma May need a f/u to see what effect this is having though at his age and with life expectancy not sure  Hepatic encephalopathy Continue lactulose and xifaxan Seems ok today Could need higher dose of lactulose Stop driving  Protein-calorie malnutrition, severe Using Ensure when he can take it Hopefully paracentesis will relieve early satiety If it doesn't need top consider rhat duodenal stricture a problem again  Duodenal stricture ? If this is part of early satiety - vs ascites compressing stomach   Clearly deteriorating and medical problems and advanced age catching up. Wife aware of tenuous status.Will see if paracentesis helpw - may need more frequently. ? If cardiac Rx would be any help with ascites - but again age and all his problems mounting in severity  WU:[id number] A, MD

## 2014-06-30 NOTE — Assessment & Plan Note (Addendum)
Continue lactulose and xifaxan Seems ok today Could need higher dose of lactulose Stop driving

## 2014-06-30 NOTE — Patient Instructions (Addendum)
If thinking gets slow or there is confusion give extra lactulose up to 4 tablespoons in a day then call me.  Stop driving the car or truck.  You have been scheduled for an abdominal ultrasound/ ultrasound guided paracentesis  at [facility] (1st floor of hospital) on 07/01/14 at 9:30am. Please arrive 15 minutes prior to your appointment for registration. Make certain to have nothing to eat or drink after midnight the night before your procedure. Should you need to reschedule your appointment, please contact radiology at [phone]. This test typically takes about 30 minutes to perform.  Follow up with Dr Leone Payor in a month, appointment made for 08/05/14 at 2:15pm.   I appreciate the opportunity to care for you.

## 2014-06-30 NOTE — Assessment & Plan Note (Signed)
?   If this is part of early satiety - vs ascites compressing stomach

## 2014-06-30 NOTE — Assessment & Plan Note (Signed)
Worsening

## 2014-07-01 ENCOUNTER — Ambulatory Visit (HOSPITAL_COMMUNITY)
Admission: RE | Admit: 2014-07-01 | Discharge: 2014-07-01 | Disposition: A | Discharge status: Home / Self Care | Payer: Medicare Other | Source: Ambulatory Visit | Attending: Internal Medicine | Admitting: Internal Medicine

## 2014-07-01 VITALS — BP 113/62

## 2014-07-01 DIAGNOSIS — K7460 Unspecified cirrhosis of liver: Secondary | ICD-10-CM

## 2014-07-01 DIAGNOSIS — R188 Other ascites: Secondary | ICD-10-CM | POA: Insufficient documentation

## 2014-07-01 DIAGNOSIS — K829 Disease of gallbladder, unspecified: Secondary | ICD-10-CM | POA: Insufficient documentation

## 2014-07-01 DIAGNOSIS — K838 Other specified diseases of biliary tract: Secondary | ICD-10-CM | POA: Diagnosis not present

## 2014-07-01 LAB — BODY FLUID CELL COUNT WITH DIFFERENTIAL
EOS FL: 0 %
Lymphs, Fluid: 7 %
MONOCYTE-MACROPHAGE-SEROUS FLUID: 84 % (ref 50–90)
Neutrophil Count, Fluid: 9 % (ref 0–25)
WBC FLUID: 154 uL (ref 0–1000)

## 2014-07-01 NOTE — Procedures (Signed)
US guided diagnostic/therapeutic paracentesis performed yielding 3.4 liters yellow fluid. A portion of the fluid was sent to the lab for preordered studies. No immediate complications.

## 2014-07-02 NOTE — Progress Notes (Signed)
Quick Note:  Notified via My Chart ______ 

## 2014-07-03 ENCOUNTER — Other Ambulatory Visit: Payer: Self-pay | Admitting: Internal Medicine

## 2014-07-03 DIAGNOSIS — K7460 Unspecified cirrhosis of liver: Secondary | ICD-10-CM

## 2014-07-03 NOTE — Progress Notes (Signed)
Quick Note:  No cancer in fluid Feels better after 3.4 L paracentesis Will recheck BMET next week and see about changing diuretics Explained to wife - also that we can do repeat paracentesis to keep fluid off and help with compression of stomach by ascites ______

## 2014-07-03 NOTE — Assessment & Plan Note (Signed)
Recheck kidney fx and see if we can titrate up on diuretics

## 2014-07-06 ENCOUNTER — Other Ambulatory Visit: Payer: Self-pay

## 2014-07-06 ENCOUNTER — Other Ambulatory Visit (INDEPENDENT_AMBULATORY_CARE_PROVIDER_SITE_OTHER): Payer: Medicare Other

## 2014-07-06 DIAGNOSIS — K7460 Unspecified cirrhosis of liver: Secondary | ICD-10-CM

## 2014-07-06 DIAGNOSIS — K7469 Other cirrhosis of liver: Secondary | ICD-10-CM

## 2014-07-06 LAB — BASIC METABOLIC PANEL
BUN: 39 mg/dL — AB (ref 6–23)
CHLORIDE: 98 meq/L (ref 96–112)
CO2: 27 mEq/L (ref 19–32)
CREATININE: 2.1 mg/dL — AB (ref 0.4–1.5)
Calcium: 8.6 mg/dL (ref 8.4–10.5)
GFR: 32.41 mL/min — AB (ref >60.00)
Glucose, Bld: 109 mg/dL — ABNORMAL HIGH (ref 70–99)
Potassium: 4.9 mEq/L (ref 3.5–5.1)
Sodium: 132 mEq/L — ABNORMAL LOW (ref 135–145)

## 2014-07-06 MED ORDER — FUROSEMIDE 20 MG PO TABS: 20.0000 mg | ORAL_TABLET | Freq: Every day | ORAL | Status: DC

## 2014-07-06 MED ORDER — SPIRONOLACTONE 50 MG PO TABS: 50.0000 mg | ORAL_TABLET | Freq: Every day | ORAL | Status: DC

## 2014-07-06 NOTE — Progress Notes (Signed)
Quick Note:  Kidney function has deteriorated some.  change furosemide 20 mg daily Change Spiriva lactone to 50 mg daily In one week approximately do a BEMET and get weighed ______

## 2014-07-14 ENCOUNTER — Other Ambulatory Visit (INDEPENDENT_AMBULATORY_CARE_PROVIDER_SITE_OTHER): Payer: Medicare Other

## 2014-07-14 DIAGNOSIS — K7460 Unspecified cirrhosis of liver: Secondary | ICD-10-CM

## 2014-07-14 LAB — CBC WITH DIFFERENTIAL/PLATELET
Basophils Absolute: 0 10*3/uL (ref 0.0–0.1)
Basophils Relative: 0.2 % (ref 0.0–3.0)
EOS PCT: 4.1 % (ref 0.0–5.0)
Eosinophils Absolute: 0.2 10*3/uL (ref 0.0–0.7)
HCT: 30.1 % — ABNORMAL LOW (ref 39.0–52.0)
Hemoglobin: 9.9 g/dL — ABNORMAL LOW (ref 13.0–17.0)
Lymphocytes Relative: 16.4 % (ref 12.0–46.0)
Lymphs Abs: 0.8 10*3/uL (ref 0.7–4.0)
MCHC: 32.9 g/dL (ref 30.0–36.0)
MCV: 94 fl (ref 78.0–100.0)
MONOS PCT: 9.1 % (ref 3.0–12.0)
Monocytes Absolute: 0.5 10*3/uL (ref 0.1–1.0)
NEUTROS PCT: 70.2 % (ref 43.0–77.0)
Neutro Abs: 3.5 10*3/uL (ref 1.4–7.7)
Platelets: 187 10*3/uL (ref 150.0–400.0)
RBC: 3.2 Mil/uL — AB (ref 4.22–5.81)
RDW: 15.5 % (ref 11.5–15.5)
WBC: 5 10*3/uL (ref 4.0–10.5)

## 2014-07-14 LAB — BASIC METABOLIC PANEL
BUN: 26 mg/dL — ABNORMAL HIGH (ref 6–23)
CO2: 27 mEq/L (ref 19–32)
Calcium: 8.4 mg/dL (ref 8.4–10.5)
Chloride: 99 mEq/L (ref 96–112)
Creatinine, Ser: 1.3 mg/dL (ref 0.4–1.5)
GFR: 53.83 mL/min — AB (ref >60.00)
GLUCOSE: 147 mg/dL — AB (ref 70–99)
POTASSIUM: 5.1 meq/L (ref 3.5–5.1)
SODIUM: 131 meq/L — AB (ref 135–145)

## 2014-07-15 ENCOUNTER — Telehealth: Payer: Self-pay | Admitting: Internal Medicine

## 2014-07-15 NOTE — Telephone Encounter (Signed)
See results notes under labs for additional details

## 2014-07-15 NOTE — Progress Notes (Signed)
Quick Note:  Kidney fx much better Hgb stable  1) How is he feeling re: abdominal distention and eating 2) Ask for a weight also 3) will then decide about changing meds again vs. Repeat paracentesis ______

## 2014-07-20 ENCOUNTER — Other Ambulatory Visit: Payer: Self-pay

## 2014-07-20 DIAGNOSIS — K7469 Other cirrhosis of liver: Secondary | ICD-10-CM

## 2014-07-20 MED ORDER — SPIRONOLACTONE 50 MG PO TABS: 100.0000 mg | ORAL_TABLET | Freq: Every day | ORAL | Status: DC

## 2014-07-20 MED ORDER — FUROSEMIDE 20 MG PO TABS: 40.0000 mg | ORAL_TABLET | Freq: Every day | ORAL | Status: DC

## 2014-07-20 NOTE — Progress Notes (Signed)
Quick Note:  Change furosemide to 40 mg daily and spironolactone to 100 mg daily BMET in 2 weeks REV me in 6 weeks unless already has one ______

## 2014-08-04 ENCOUNTER — Other Ambulatory Visit (INDEPENDENT_AMBULATORY_CARE_PROVIDER_SITE_OTHER): Payer: Medicare Other

## 2014-08-04 DIAGNOSIS — K7460 Unspecified cirrhosis of liver: Secondary | ICD-10-CM

## 2014-08-04 DIAGNOSIS — K7469 Other cirrhosis of liver: Secondary | ICD-10-CM

## 2014-08-04 LAB — BASIC METABOLIC PANEL
BUN: 23 mg/dL (ref 6–23)
CHLORIDE: 102 meq/L (ref 96–112)
CO2: 28 meq/L (ref 19–32)
CREATININE: 1.4 mg/dL (ref 0.4–1.5)
Calcium: 8.4 mg/dL (ref 8.4–10.5)
GFR: 53.36 mL/min — ABNORMAL LOW (ref >60.00)
Glucose, Bld: 81 mg/dL (ref 70–99)
Potassium: 4.7 mEq/L (ref 3.5–5.1)
Sodium: 132 mEq/L — ABNORMAL LOW (ref 135–145)

## 2014-08-05 ENCOUNTER — Ambulatory Visit (INDEPENDENT_AMBULATORY_CARE_PROVIDER_SITE_OTHER): Payer: Medicare Other | Admitting: Internal Medicine

## 2014-08-05 ENCOUNTER — Encounter: Payer: Self-pay | Admitting: Internal Medicine

## 2014-08-05 VITALS — BP 96/50 | HR 80 | Ht 66.0 in | Wt 120.0 lb

## 2014-08-05 DIAGNOSIS — Z9181 History of falling: Secondary | ICD-10-CM

## 2014-08-05 DIAGNOSIS — B0229 Other postherpetic nervous system involvement: Secondary | ICD-10-CM

## 2014-08-05 DIAGNOSIS — K7682 Hepatic encephalopathy: Secondary | ICD-10-CM

## 2014-08-05 DIAGNOSIS — R188 Other ascites: Secondary | ICD-10-CM

## 2014-08-05 DIAGNOSIS — I2510 Atherosclerotic heart disease of native coronary artery without angina pectoris: Secondary | ICD-10-CM

## 2014-08-05 DIAGNOSIS — E43 Unspecified severe protein-calorie malnutrition: Secondary | ICD-10-CM

## 2014-08-05 DIAGNOSIS — K7460 Unspecified cirrhosis of liver: Secondary | ICD-10-CM

## 2014-08-05 DIAGNOSIS — R296 Repeated falls: Secondary | ICD-10-CM

## 2014-08-05 DIAGNOSIS — R609 Edema, unspecified: Secondary | ICD-10-CM

## 2014-08-05 DIAGNOSIS — K7290 Hepatic failure, unspecified without coma: Secondary | ICD-10-CM

## 2014-08-05 MED ORDER — LIDOCAINE 5 % EX PTCH: 1.0000 | MEDICATED_PATCH | CUTANEOUS | Status: DC

## 2014-08-05 NOTE — Progress Notes (Signed)
Subjective:    Patient ID: Kirk [name], male    DOB: 1927/08/03, 80 y.o.   MRN: [id number]  HPI Kirk [name] and his wife for followup. He has been holding steady overall does remain active and engaged. He continues to be busy around his house. He does have early satiety again and has gained some weight and has recurrent ascites. He had a paracentesis about a month ago. Allergies  Allergen Reactions  . Erythromycin Itching and Rash    Patient allergic to all Mycins   . Penicillins Rash   Outpatient Prescriptions Prior to Visit  Medication Sig Dispense Refill  . escitalopram (LEXAPRO) 10 MG tablet Take 5 mg by mouth 2 (two) times daily.       . furosemide (LASIX) 20 MG tablet Take 2 tablets (40 mg total) by mouth daily.  60 tablet  4  . hydrALAZINE (APRESOLINE) 25 MG tablet Take 25 mg by mouth 2 (two) times daily.      . hyoscyamine (LEVSIN SL) 0.125 MG SL tablet Place 0.125 mg under the tongue every 4 (four) hours as needed for cramping.      . lactulose (CHRONULAC) 10 GM/15ML solution Take 10 g by mouth daily.      . ondansetron (ZOFRAN) 4 MG tablet Take 4 mg by mouth every 6 (six) hours as needed for nausea or vomiting.      Marland Kitchen. oxyCODONE (OXY IR/ROXICODONE) 5 MG immediate release tablet Take 5 mg by mouth every 4 (four) hours as needed for moderate pain or severe pain.      . pantoprazole (PROTONIX) 40 MG tablet Take 40 mg by mouth daily.      . rifaximin (XIFAXAN) 550 MG TABS tablet Take 550 mg by mouth 2 (two) times daily.      . silver sulfADIAZINE (SILVADENE) 1 % cream Apply 1 application topically daily.      Marland Kitchen. spironolactone (ALDACTONE) 50 MG tablet Take 2 tablets (100 mg total) by mouth daily.  60 tablet  3  . triamcinolone cream (KENALOG) 0.5 % Apply 1 application topically 2 (two) times daily.       No facility-administered medications prior to visit.   Past Medical History  Diagnosis Date  . GERD (gastroesophageal reflux disease)   . Diverticulosis     s/p partial  colon resection  . CAD (coronary artery disease)     s/p MI 1999;  s/p CABG 2005 (L-LAD, S-Dx, S-OM1, S-PDA); patient had post op AFib;  Myoview 8/10: mild fixed lateral defect; no ischemia, EF 47%  . Hyperlipemia   . Ischemic cardiomyopathy     echo 1/11: mild MR, mild LAE, EF 40-45%;  echo 10/12: EF 40-45%, IL AK, Post AK, grade 2 diast dysfxn, mild to mod MR, mild BAE, PASP 38;   Echo 9/14:  Mild focal basal septal hypertrophy, EF 40-45%, IL, inf and post HK, Gr 1 DD, MAC, mild MR, mild LAE, mild RVE, mild RAE, PASP 32, L pleural effusion  . Hypertension   . Arthritis   . Common bile duct dilatation   . Pancreatitis     post-ERCP 11/2009  . Colitis, ischemic   . Chronic systolic heart failure     Echo 09/05/11: mild focal basal septal hypertrophy, EF 40-45%, basal to mid IL AK, post AK, grade 2 diast dysfxn, mild to mod MR, mild BAE, PASP 38  . Atrial fibrillation     post CABG in 2005 and in setting of acute illness in 1/11 (  post ERCP pancreatitis) - coumadin not felt to be indicated  . Lumbar spondylosis   . Elevated LFTs     statin d/c'd in past due to   . BPH (benign prostatic hyperplasia)   . Sinus bradycardia     on beta blockers  . Ulcer 10/2012    in pylorus  . Allergy   . Anemia   . Diverticulitis     with partial colectomy  . Cirrhosis     Negative workup for viral hepatitis, autoimmune hepatitis, hemochromatosis. Never heavy ETOH. Suspect NASH. EGD (12/13) with esophageal varices and portal hypertensive gastropathy. EGD (3/14) with 3 small esophageal varices, portal hypertensive gastropathy.  . Myocardial infarction 1991; 1999  . Esophageal varices in cirrhosis     Hattie Perch 08/04/2013 (08/05/2013)  . Depression   . Anxiety   . Hyperkalemia requiring hospitalization 08/04/2013  . Cirrhosis   . Herpes zoster 03/27/2014   Past Surgical History  Procedure Laterality Date  . Colon resection      "removed 12-15' of colon" (08/05/2013)  . Back surgery    . Ercp  11/13/2009     dilated bile ducts, atypical cytology, repeat attempt normal pancreatogram but unsuccessful biliary cannulation  . Angioplasty  1999  . Shoulder open rotator cuff repair Bilateral     "I've had a total of 3 OR's for this" (08/05/2013)  . Cataract extraction w/ intraocular lens implant Right   . Esophagogastroduodenoscopy    . Flexible sigmoidoscopy    . Colonoscopy    . Tonsillectomy    . Coronary angioplasty with stent placement      "before the bypass" (08/05/2013)  . Coronary artery bypass graft  2005    "CABG X7" (08/05/2013)x 4  . Lumbar disc surgery       Review of Systems He is having neuropathic type pain as he describes it in the right posterior scapular region in the vicinity of his previous shingles. His coccyx still bothersome after he fell, and a half ago.    Objective:   Physical Exam General:  Gaunt, asthenic and in no acute distress Eyes:  anicteric. Lungs: Clear to auscultation bilaterally. Heart:  S1S2, no rubs, murmurs, gallops. Abdomen:  distended with ascites, somewhat tight, non-tender, + prominent abdominal wall veinssoft, BS+.  Lymph:  no cervical or supraclavicular adenopathy. Extremities:   Trace LE  Edema bilat mid pre-tibial Skin   mild erythema in right posterior scapular area Neuro:  A&O x 3. No asterixis Psych:  appropriate mood and  Affect.   Data Reviewed:   Chemistry      Component Value Date/Time   NA 132* 08/04/2014 1129   K 4.7 08/04/2014 1129   CL 102 08/04/2014 1129   CO2 28 08/04/2014 1129   BUN 23 08/04/2014 1129   CREATININE 1.4 08/04/2014 1129      Component Value Date/Time   CALCIUM 8.4 08/04/2014 1129   ALKPHOS 199* 06/25/2014 1339   AST 49* 06/25/2014 1339   ALT 22 06/25/2014 1339   BILITOT 0.9 06/25/2014 1339        Assessment & Plan:  Cirrhosis of liver without mention of alcohol Slow decline. Continue current therapy. Return to clinic two months.  Hepatic encephalopathy Appears controlled/treated  Ascites Needs another  paracentesis No diuretic change with Na 132  Edema improved  Protein-calorie malnutrition, severe persists  Recurrent falls None in past month   overall situation remains complicated but he is gaining and there. He has decompensated cirrhosis that is complicated  mostly by ascites and inability to control after diuretics and diet because of hyponatremia or sensitive kidneys. His creatinine is normal right now, we'll hold stent current therapy and have him do another paracentesis. I explained to his wife that she can call back sooner than the 2 month followup if he fills up with fluid again and we can perform another paracentesis.   I appreciate the opportunity to care for this patient. CC: Minda Meo, MD

## 2014-08-05 NOTE — Assessment & Plan Note (Signed)
Needs another paracentesis No diuretic change with Na 132

## 2014-08-05 NOTE — Assessment & Plan Note (Signed)
None in past month

## 2014-08-05 NOTE — Assessment & Plan Note (Signed)
Appears controlled/treated

## 2014-08-05 NOTE — Assessment & Plan Note (Signed)
persists

## 2014-08-05 NOTE — Assessment & Plan Note (Signed)
improved

## 2014-08-05 NOTE — Assessment & Plan Note (Addendum)
Slow decline. Continue current therapy. Return to clinic two months.

## 2014-08-05 NOTE — Patient Instructions (Addendum)
I will contact you regarding the paracentesis appointment.  [facility] arrive 10:45 AM.  Wife notified.  We have sent the following medications to your pharmacy for you to pick up at your convenience: lido derm patches  I appreciate the opportunity to care for you.

## 2014-08-06 ENCOUNTER — Telehealth: Payer: Self-pay

## 2014-08-06 NOTE — Telephone Encounter (Signed)
Called [employer] at [phone] to do a prior authorization on Lidocaine 5% patch's.  Member ID# [id number].  Dx: post herpetic neuralgia, B02.29.  It has been approved thru 08/05/2017.  [facility] and patient's wife notified.

## 2014-08-11 ENCOUNTER — Ambulatory Visit (HOSPITAL_COMMUNITY)
Admission: RE | Admit: 2014-08-11 | Discharge: 2014-08-11 | Disposition: A | Discharge status: Home / Self Care | Payer: Medicare Other | Source: Ambulatory Visit | Attending: Internal Medicine | Admitting: Internal Medicine

## 2014-08-11 DIAGNOSIS — R188 Other ascites: Secondary | ICD-10-CM

## 2014-08-11 DIAGNOSIS — K7460 Unspecified cirrhosis of liver: Secondary | ICD-10-CM | POA: Diagnosis not present

## 2014-08-11 NOTE — Procedures (Signed)
US guided therapeutic paracentesis performed yielding 2.9 liters yellow fluid. No immediate complications.  

## 2014-08-26 ENCOUNTER — Other Ambulatory Visit (HOSPITAL_COMMUNITY): Payer: Self-pay | Admitting: Internal Medicine

## 2014-08-26 DIAGNOSIS — R188 Other ascites: Secondary | ICD-10-CM

## 2014-08-27 ENCOUNTER — Ambulatory Visit (HOSPITAL_COMMUNITY)
Admission: RE | Admit: 2014-08-27 | Discharge: 2014-08-27 | Disposition: A | Discharge status: Home / Self Care | Payer: Medicare Other | Source: Ambulatory Visit | Attending: Internal Medicine | Admitting: Internal Medicine

## 2014-08-27 DIAGNOSIS — R188 Other ascites: Secondary | ICD-10-CM | POA: Insufficient documentation

## 2014-08-27 DIAGNOSIS — K7460 Unspecified cirrhosis of liver: Secondary | ICD-10-CM | POA: Diagnosis not present

## 2014-08-27 NOTE — Procedures (Signed)
Successful US guided paracentesis from RUQ.  Yielded 2.6 liters of serous fluid.  No immediate complications.  Pt tolerated well.   Specimen was not sent for labs.  Pattricia BossMORGAN, Calandria Mullings D PA-C 08/27/2014 11:32 AM

## 2014-09-19 ENCOUNTER — Inpatient Hospital Stay (HOSPITAL_COMMUNITY)
Admission: EM | Admit: 2014-09-19 | Discharge: 2014-09-25 | DRG: 441 | Disposition: A | Discharge status: Hospice | Payer: Medicare Other | Attending: Internal Medicine | Admitting: Internal Medicine

## 2014-09-19 ENCOUNTER — Encounter (HOSPITAL_COMMUNITY): Payer: Self-pay | Admitting: Emergency Medicine

## 2014-09-19 ENCOUNTER — Emergency Department (HOSPITAL_COMMUNITY): Payer: Medicare Other

## 2014-09-19 DIAGNOSIS — I4891 Unspecified atrial fibrillation: Secondary | ICD-10-CM | POA: Diagnosis present

## 2014-09-19 DIAGNOSIS — R54 Age-related physical debility: Secondary | ICD-10-CM | POA: Diagnosis present

## 2014-09-19 DIAGNOSIS — I252 Old myocardial infarction: Secondary | ICD-10-CM

## 2014-09-19 DIAGNOSIS — R278 Other lack of coordination: Secondary | ICD-10-CM | POA: Diagnosis present

## 2014-09-19 DIAGNOSIS — R188 Other ascites: Secondary | ICD-10-CM | POA: Diagnosis present

## 2014-09-19 DIAGNOSIS — K3189 Other diseases of stomach and duodenum: Secondary | ICD-10-CM | POA: Diagnosis present

## 2014-09-19 DIAGNOSIS — Z961 Presence of intraocular lens: Secondary | ICD-10-CM | POA: Diagnosis present

## 2014-09-19 DIAGNOSIS — R109 Unspecified abdominal pain: Secondary | ICD-10-CM

## 2014-09-19 DIAGNOSIS — D649 Anemia, unspecified: Secondary | ICD-10-CM | POA: Diagnosis present

## 2014-09-19 DIAGNOSIS — Z951 Presence of aortocoronary bypass graft: Secondary | ICD-10-CM

## 2014-09-19 DIAGNOSIS — Z8249 Family history of ischemic heart disease and other diseases of the circulatory system: Secondary | ICD-10-CM

## 2014-09-19 DIAGNOSIS — I10 Essential (primary) hypertension: Secondary | ICD-10-CM | POA: Diagnosis present

## 2014-09-19 DIAGNOSIS — K5900 Constipation, unspecified: Secondary | ICD-10-CM

## 2014-09-19 DIAGNOSIS — N179 Acute kidney failure, unspecified: Secondary | ICD-10-CM | POA: Diagnosis present

## 2014-09-19 DIAGNOSIS — I255 Ischemic cardiomyopathy: Secondary | ICD-10-CM | POA: Diagnosis present

## 2014-09-19 DIAGNOSIS — Z681 Body mass index (BMI) 19 or less, adult: Secondary | ICD-10-CM

## 2014-09-19 DIAGNOSIS — M1990 Unspecified osteoarthritis, unspecified site: Secondary | ICD-10-CM | POA: Diagnosis present

## 2014-09-19 DIAGNOSIS — K219 Gastro-esophageal reflux disease without esophagitis: Secondary | ICD-10-CM | POA: Diagnosis present

## 2014-09-19 DIAGNOSIS — K7469 Other cirrhosis of liver: Secondary | ICD-10-CM | POA: Diagnosis present

## 2014-09-19 DIAGNOSIS — K7290 Hepatic failure, unspecified without coma: Secondary | ICD-10-CM | POA: Diagnosis present

## 2014-09-19 DIAGNOSIS — R404 Transient alteration of awareness: Secondary | ICD-10-CM | POA: Diagnosis present

## 2014-09-19 DIAGNOSIS — I5042 Chronic combined systolic (congestive) and diastolic (congestive) heart failure: Secondary | ICD-10-CM | POA: Diagnosis present

## 2014-09-19 DIAGNOSIS — J690 Pneumonitis due to inhalation of food and vomit: Secondary | ICD-10-CM | POA: Diagnosis present

## 2014-09-19 DIAGNOSIS — Z880 Allergy status to penicillin: Secondary | ICD-10-CM | POA: Diagnosis not present

## 2014-09-19 DIAGNOSIS — K5641 Fecal impaction: Secondary | ICD-10-CM | POA: Diagnosis present

## 2014-09-19 DIAGNOSIS — K7460 Unspecified cirrhosis of liver: Secondary | ICD-10-CM | POA: Diagnosis present

## 2014-09-19 DIAGNOSIS — E43 Unspecified severe protein-calorie malnutrition: Secondary | ICD-10-CM | POA: Diagnosis present

## 2014-09-19 DIAGNOSIS — Z79891 Long term (current) use of opiate analgesic: Secondary | ICD-10-CM

## 2014-09-19 DIAGNOSIS — K5660 Unspecified intestinal obstruction: Secondary | ICD-10-CM | POA: Diagnosis present

## 2014-09-19 DIAGNOSIS — E785 Hyperlipidemia, unspecified: Secondary | ICD-10-CM | POA: Diagnosis present

## 2014-09-19 DIAGNOSIS — I2510 Atherosclerotic heart disease of native coronary artery without angina pectoris: Secondary | ICD-10-CM | POA: Diagnosis present

## 2014-09-19 DIAGNOSIS — R4781 Slurred speech: Secondary | ICD-10-CM | POA: Diagnosis present

## 2014-09-19 DIAGNOSIS — Z881 Allergy status to other antibiotic agents status: Secondary | ICD-10-CM

## 2014-09-19 DIAGNOSIS — R64 Cachexia: Secondary | ICD-10-CM | POA: Diagnosis present

## 2014-09-19 DIAGNOSIS — R1110 Vomiting, unspecified: Secondary | ICD-10-CM

## 2014-09-19 DIAGNOSIS — Z9841 Cataract extraction status, right eye: Secondary | ICD-10-CM

## 2014-09-19 DIAGNOSIS — Z833 Family history of diabetes mellitus: Secondary | ICD-10-CM

## 2014-09-19 DIAGNOSIS — G8929 Other chronic pain: Secondary | ICD-10-CM | POA: Diagnosis present

## 2014-09-19 DIAGNOSIS — G9340 Encephalopathy, unspecified: Secondary | ICD-10-CM | POA: Diagnosis present

## 2014-09-19 DIAGNOSIS — N400 Enlarged prostate without lower urinary tract symptoms: Secondary | ICD-10-CM | POA: Diagnosis present

## 2014-09-19 DIAGNOSIS — K7682 Hepatic encephalopathy: Secondary | ICD-10-CM | POA: Diagnosis present

## 2014-09-19 DIAGNOSIS — E860 Dehydration: Secondary | ICD-10-CM | POA: Diagnosis present

## 2014-09-19 DIAGNOSIS — K4090 Unilateral inguinal hernia, without obstruction or gangrene, not specified as recurrent: Secondary | ICD-10-CM | POA: Diagnosis present

## 2014-09-19 DIAGNOSIS — F17210 Nicotine dependence, cigarettes, uncomplicated: Secondary | ICD-10-CM | POA: Diagnosis present

## 2014-09-19 DIAGNOSIS — Z79899 Other long term (current) drug therapy: Secondary | ICD-10-CM

## 2014-09-19 DIAGNOSIS — T17910A Gastric contents in respiratory tract, part unspecified causing asphyxiation, initial encounter: Secondary | ICD-10-CM

## 2014-09-19 DIAGNOSIS — K766 Portal hypertension: Secondary | ICD-10-CM | POA: Diagnosis present

## 2014-09-19 DIAGNOSIS — Z801 Family history of malignant neoplasm of trachea, bronchus and lung: Secondary | ICD-10-CM | POA: Diagnosis not present

## 2014-09-19 DIAGNOSIS — K56609 Unspecified intestinal obstruction, unspecified as to partial versus complete obstruction: Secondary | ICD-10-CM | POA: Diagnosis present

## 2014-09-19 DIAGNOSIS — I8510 Secondary esophageal varices without bleeding: Secondary | ICD-10-CM | POA: Diagnosis present

## 2014-09-19 LAB — CBC WITH DIFFERENTIAL/PLATELET
Basophils Absolute: 0 10*3/uL (ref 0.0–0.1)
Basophils Relative: 0 % (ref 0–1)
Eosinophils Absolute: 0 10*3/uL (ref 0.0–0.7)
Eosinophils Relative: 0 % (ref 0–5)
HCT: 26 % — ABNORMAL LOW (ref 39.0–52.0)
Hemoglobin: 9.1 g/dL — ABNORMAL LOW (ref 13.0–17.0)
LYMPHS PCT: 9 % — AB (ref 12–46)
Lymphs Abs: 1 10*3/uL (ref 0.7–4.0)
MCH: 31.4 pg (ref 26.0–34.0)
MCHC: 35 g/dL (ref 30.0–36.0)
MCV: 89.7 fL (ref 78.0–100.0)
Monocytes Absolute: 1 10*3/uL (ref 0.1–1.0)
Monocytes Relative: 9 % (ref 3–12)
NEUTROS PCT: 82 % — AB (ref 43–77)
Neutro Abs: 9.5 10*3/uL — ABNORMAL HIGH (ref 1.7–7.7)
PLATELETS: 161 10*3/uL (ref 150–400)
RBC: 2.9 MIL/uL — ABNORMAL LOW (ref 4.22–5.81)
RDW: 15.7 % — ABNORMAL HIGH (ref 11.5–15.5)
WBC: 11.5 10*3/uL — AB (ref 4.0–10.5)

## 2014-09-19 LAB — COMPREHENSIVE METABOLIC PANEL
ALK PHOS: 157 U/L — AB (ref 39–117)
ALT: 15 U/L (ref 0–53)
AST: 29 U/L (ref 0–37)
Albumin: 2.1 g/dL — ABNORMAL LOW (ref 3.5–5.2)
Anion gap: 11 (ref 5–15)
BUN: 44 mg/dL — ABNORMAL HIGH (ref 6–23)
CHLORIDE: 96 meq/L (ref 96–112)
CO2: 26 meq/L (ref 19–32)
Calcium: 8.5 mg/dL (ref 8.4–10.5)
Creatinine, Ser: 1.87 mg/dL — ABNORMAL HIGH (ref 0.50–1.35)
GFR calc Af Amer: 36 mL/min — ABNORMAL LOW (ref >90)
GFR, EST NON AFRICAN AMERICAN: 31 mL/min — AB (ref >90)
Glucose, Bld: 125 mg/dL — ABNORMAL HIGH (ref 70–99)
POTASSIUM: 4.8 meq/L (ref 3.7–5.3)
SODIUM: 133 meq/L — AB (ref 137–147)
Total Bilirubin: 0.9 mg/dL (ref 0.3–1.2)
Total Protein: 7.8 g/dL (ref 6.0–8.3)

## 2014-09-19 LAB — URINALYSIS, ROUTINE W REFLEX MICROSCOPIC
Bilirubin Urine: NEGATIVE
GLUCOSE, UA: NEGATIVE mg/dL
HGB URINE DIPSTICK: NEGATIVE
Ketones, ur: NEGATIVE mg/dL
Leukocytes, UA: NEGATIVE
Nitrite: NEGATIVE
PH: 7.5 (ref 5.0–8.0)
PROTEIN: NEGATIVE mg/dL
Specific Gravity, Urine: 1.01 (ref 1.005–1.030)
Urobilinogen, UA: 1 mg/dL (ref 0.0–1.0)

## 2014-09-19 LAB — LIPASE, BLOOD: LIPASE: 11 U/L (ref 11–59)

## 2014-09-19 LAB — I-STAT CG4 LACTIC ACID, ED: Lactic Acid, Venous: 1.06 mmol/L (ref 0.5–2.2)

## 2014-09-19 LAB — PROTIME-INR
INR: 1.39 (ref 0.00–1.49)
Prothrombin Time: 17.2 seconds — ABNORMAL HIGH (ref 11.6–15.2)

## 2014-09-19 LAB — AMMONIA: Ammonia: 52 umol/L (ref 11–60)

## 2014-09-19 MED ORDER — ALUM & MAG HYDROXIDE-SIMETH 200-200-20 MG/5ML PO SUSP: 30.0000 mL | Freq: Four times a day (QID) | ORAL | Status: DC | PRN

## 2014-09-19 MED ORDER — ONDANSETRON HCL 4 MG/2ML IJ SOLN
4.0000 mg | Freq: Once | INTRAMUSCULAR | Status: DC
  Filled 2014-09-19: qty 2

## 2014-09-19 MED ORDER — HYOSCYAMINE SULFATE 0.125 MG SL SUBL
0.1250 mg | SUBLINGUAL_TABLET | SUBLINGUAL | Status: DC | PRN
  Filled 2014-09-19: qty 1

## 2014-09-19 MED ORDER — ONDANSETRON HCL 4 MG/2ML IJ SOLN
4.0000 mg | Freq: Four times a day (QID) | INTRAMUSCULAR | Status: DC | PRN
  Administered 2014-09-19: 4 mg via INTRAVENOUS

## 2014-09-19 MED ORDER — ESCITALOPRAM OXALATE 5 MG PO TABS
5.0000 mg | ORAL_TABLET | Freq: Two times a day (BID) | ORAL | Status: DC
  Administered 2014-09-19: 5 mg via ORAL
  Filled 2014-09-19 (×4): qty 1

## 2014-09-19 MED ORDER — ACETAMINOPHEN 650 MG RE SUPP: 650.0000 mg | Freq: Four times a day (QID) | RECTAL | Status: DC | PRN

## 2014-09-19 MED ORDER — SODIUM CHLORIDE 0.9 % IV SOLN
INTRAVENOUS | Status: DC
  Administered 2014-09-19 – 2014-09-23 (×5): via INTRAVENOUS

## 2014-09-19 MED ORDER — RIFAXIMIN 550 MG PO TABS
550.0000 mg | ORAL_TABLET | Freq: Two times a day (BID) | ORAL | Status: DC
  Administered 2014-09-19 – 2014-09-24 (×11): 550 mg via ORAL
  Filled 2014-09-19 (×15): qty 1

## 2014-09-19 MED ORDER — ONDANSETRON HCL 4 MG PO TABS: 4.0000 mg | ORAL_TABLET | Freq: Four times a day (QID) | ORAL | Status: DC | PRN

## 2014-09-19 MED ORDER — OXYCODONE HCL 5 MG PO TABS
5.0000 mg | ORAL_TABLET | ORAL | Status: DC | PRN
  Administered 2014-09-19 (×2): 5 mg via ORAL
  Filled 2014-09-19 (×2): qty 1

## 2014-09-19 MED ORDER — ACETAMINOPHEN 325 MG PO TABS: 650.0000 mg | ORAL_TABLET | Freq: Four times a day (QID) | ORAL | Status: DC | PRN

## 2014-09-19 MED ORDER — PANTOPRAZOLE SODIUM 40 MG IV SOLR
40.0000 mg | INTRAVENOUS | Status: DC
  Administered 2014-09-19 – 2014-09-22 (×4): 40 mg via INTRAVENOUS
  Filled 2014-09-19 (×6): qty 40

## 2014-09-19 MED ORDER — MORPHINE SULFATE 2 MG/ML IJ SOLN
2.0000 mg | INTRAMUSCULAR | Status: DC | PRN
  Administered 2014-09-20 – 2014-09-21 (×2): 2 mg via INTRAVENOUS
  Filled 2014-09-19 (×2): qty 1

## 2014-09-19 MED ORDER — SODIUM CHLORIDE 0.9 % IV BOLUS (SEPSIS)
1000.0000 mL | Freq: Once | INTRAVENOUS | Status: AC
  Administered 2014-09-19: 1000 mL via INTRAVENOUS

## 2014-09-19 MED ORDER — IOHEXOL 300 MG/ML  SOLN
50.0000 mL | Freq: Once | INTRAMUSCULAR | Status: AC | PRN
  Administered 2014-09-19: 50 mL via ORAL

## 2014-09-19 MED ORDER — LACTULOSE 10 GM/15ML PO SOLN
30.0000 g | Freq: Three times a day (TID) | ORAL | Status: DC
  Administered 2014-09-19: 30 g via ORAL
  Filled 2014-09-19 (×5): qty 45

## 2014-09-19 NOTE — H&P (Signed)
PCP:   Minda Meo, MD   Chief Complaint:  Decreased level of consciousness  HPI: Mr. Hohensee is an 80 year old white male with cryptogenic cirrhosis complicated by varices, ascites, and encephalopathy, ischemic cardiomyopathy (EF 40%) who presented to the emergency department with declining mental status over the past 2-3 days. Patient has had cirrhosis with multiple complications. He is on diuretics for chronic ascites requiring repeated paracentesis (most recently 08/27/14).  He has encephalopathy treated with cephalexin and lactulose. His wife states that he has been having at least 3-4 bowel movements per day. Despite this, over the past several days he has had increasing lethargy and decreased level of consciousness. At baseline, his wife states that he ambulates, verbalizes, and even goes out to the barn in his golf cart and or to the hardware store with friends.  He was doing that earlier this week. However, over the past several days he has had increased confusion, decreased level of consciousness, and decreased verbalization. He has chronic abdominal pain related to his ascites but has actually been taking less narcotics over the past several days since he has not been complaining as much of pain. His wife states that his abdominal girth has not increased significantly. He has had persistent edema. Due to the declining mental status she brought him to the emergency department where evaluation shows an increase in BUN and creatinine and acute abdominal series suggest a small bowel obstruction. CT has been obtained with results pending at this time. He requires admission for management.  Review of Systems:  Review of Systems - review of systems is limited by the patient's lack of verbal interaction. Past Medical History: Past Medical History  Diagnosis Date  . GERD (gastroesophageal reflux disease)   . Diverticulosis     s/p partial colon resection  . CAD (coronary artery disease)      s/p MI 1999;  s/p CABG 2005 (L-LAD, S-Dx, S-OM1, S-PDA); patient had post op AFib;  Myoview 8/10: mild fixed lateral defect; no ischemia, EF 47%  . Hyperlipemia   . Ischemic cardiomyopathy     echo 1/11: mild MR, mild LAE, EF 40-45%;  echo 10/12: EF 40-45%, IL AK, Post AK, grade 2 diast dysfxn, mild to mod MR, mild BAE, PASP 38;   Echo 9/14:  Mild focal basal septal hypertrophy, EF 40-45%, IL, inf and post HK, Gr 1 DD, MAC, mild MR, mild LAE, mild RVE, mild RAE, PASP 32, L pleural effusion  . Hypertension   . Arthritis   . Common bile duct dilatation   . Pancreatitis     post-ERCP 11/2009  . Colitis, ischemic   . Chronic systolic heart failure     Echo 09/05/11: mild focal basal septal hypertrophy, EF 40-45%, basal to mid IL AK, post AK, grade 2 diast dysfxn, mild to mod MR, mild BAE, PASP 38  . Atrial fibrillation     post CABG in 2005 and in setting of acute illness in 1/11 (post ERCP pancreatitis) - coumadin not felt to be indicated  . Lumbar spondylosis   . Elevated LFTs     statin d/c'd in past due to   . BPH (benign prostatic hyperplasia)   . Sinus bradycardia     on beta blockers  . Ulcer 10/2012    in pylorus  . Allergy   . Anemia   . Diverticulitis     with partial colectomy  . Cirrhosis     Negative workup for viral hepatitis, autoimmune hepatitis, hemochromatosis.  Never heavy ETOH. Suspect NASH. EGD (12/13) with esophageal varices and portal hypertensive gastropathy. EGD (3/14) with 3 small esophageal varices, portal hypertensive gastropathy.  . Myocardial infarction 1991; 1999  . Esophageal varices in cirrhosis     Hattie Perch 08/04/2013 (08/05/2013)  . Depression   . Anxiety   . Hyperkalemia requiring hospitalization 08/04/2013  . Cirrhosis   . Herpes zoster 03/27/2014   Past Surgical History  Procedure Laterality Date  . Colon resection      "removed 12-15' of colon" (08/05/2013)  . Back surgery    . Ercp  11/13/2009    dilated bile ducts, atypical cytology, repeat attempt  normal pancreatogram but unsuccessful biliary cannulation  . Angioplasty  1999  . Shoulder open rotator cuff repair Bilateral     "I've had a total of 3 OR's for this" (08/05/2013)  . Cataract extraction w/ intraocular lens implant Right   . Esophagogastroduodenoscopy    . Flexible sigmoidoscopy    . Colonoscopy    . Tonsillectomy    . Coronary angioplasty with stent placement      "before the bypass" (08/05/2013)  . Coronary artery bypass graft  2005    "CABG X7" (08/05/2013)x 4  . Lumbar disc surgery      Medications: Prior to Admission medications   Medication Sig Start Date End Date Taking? Authorizing Provider  escitalopram (LEXAPRO) 10 MG tablet Take 5 mg by mouth 2 (two) times daily.    Yes Historical Provider, MD  furosemide (LASIX) 20 MG tablet Take 2 tablets (40 mg total) by mouth daily. 07/20/14  Yes Iva Boop, MD  gabapentin (NEURONTIN) 100 MG capsule Take 100 mg by mouth 2 (two) times daily as needed. For nerve pain   Yes Historical Provider, MD  hydrALAZINE (APRESOLINE) 25 MG tablet Take 25 mg by mouth 2 (two) times daily.   Yes Historical Provider, MD  lactulose (CHRONULAC) 10 GM/15ML solution Take 10 g by mouth daily.   Yes Historical Provider, MD  oxyCODONE (OXY IR/ROXICODONE) 5 MG immediate release tablet Take 5 mg by mouth every 4 (four) hours as needed for moderate pain or severe pain.   Yes Historical Provider, MD  pantoprazole (PROTONIX) 40 MG tablet Take 40 mg by mouth daily.   Yes Historical Provider, MD  rifaximin (XIFAXAN) 550 MG TABS tablet Take 550 mg by mouth 2 (two) times daily.   Yes Historical Provider, MD  spironolactone (ALDACTONE) 50 MG tablet Take 2 tablets (100 mg total) by mouth daily. 07/20/14  Yes Iva Boop, MD  triamcinolone cream (KENALOG) 0.1 % Apply 1 application topically 2 (two) times daily.   Yes Historical Provider, MD  triamcinolone cream (KENALOG) 0.5 % Apply 1 application topically 2 (two) times daily.   Yes Historical Provider, MD   hyoscyamine (LEVSIN SL) 0.125 MG SL tablet Place 0.125 mg under the tongue every 4 (four) hours as needed for cramping.    Historical Provider, MD  lidocaine (LIDODERM) 5 % Place 1 patch onto the skin daily. Remove & Discard patch within 12 hours or as directed by MD 08/05/14   Iva Boop, MD  ondansetron (ZOFRAN) 4 MG tablet Take 4 mg by mouth every 6 (six) hours as needed for nausea or vomiting.    Historical Provider, MD  silver sulfADIAZINE (SILVADENE) 1 % cream Apply 1 application topically daily.    Historical Provider, MD    Allergies:   Allergies  Allergen Reactions  . Erythromycin Itching and Rash    Patient  allergic to all Mycins   . Penicillins Rash    Social History:  reports that he has been smoking Cigars.  He has never used smokeless tobacco. He reports that he does not drink alcohol or use illicit drugs.lives with his wife. Active in church  Family History: Family History  Problem Relation Age of Onset  . Diabetes Mother   . Heart disease Mother   . Congestive Heart Failure Mother   . Lung cancer Father     Physical Exam: Filed Vitals:   09/19/14 0800 09/19/14 0835 09/19/14 0900 09/19/14 1030  BP: 124/63 128/77 112/65 102/53  Pulse: 155 101 70 75  Temp:      TempSrc:      Resp: 24 23 20 20   SpO2: 96% 96% 96% 94%   General appearance: lethargic, minimally responsive to noxious stimuli; no verbalization Head: Normocephalic, without obvious abnormality, atraumatic Eyes: conjunctivae/corneas clear. PERRL, EOM's intact.  Nose: Nares normal. Septum midline. Mucosa normal. No drainage or sinus tenderness. Throat: lips, mucosa, and tongue normal; teeth and gums normal Neck: no adenopathy, no carotid bruit, no JVD and thyroid not enlarged, symmetric, no tenderness/mass/nodules Resp: clear to auscultation bilaterally Cardio: irregularly irregular rhythm GI: mildly distended with hypoactive bowel sounds. Diffuse tenderness without rebound or  guarding Extremities: extremities normal, atraumatic, no cyanosis; 2+ edema Lymph nodes: Cervical adenopathy: no cervical lymphadenopathy Neurologic:Lethargic, minimally responsive. Moves all extremities equally  Labs on Admission:   Recent Labs  09/19/14 0748  NA 133*  K 4.8  CL 96  CO2 26  GLUCOSE 125*  BUN 44*  CREATININE 1.87*  CALCIUM 8.5    Recent Labs  09/19/14 0748  AST 29  ALT 15  ALKPHOS 157*  BILITOT 0.9  PROT 7.8  ALBUMIN 2.1*    Recent Labs  09/19/14 0748  LIPASE 11    Recent Labs  09/19/14 0748  WBC 11.5*  NEUTROABS 9.5*  HGB 9.1*  HCT 26.0*  MCV 89.7  PLT 161   No results for input(s): CKTOTAL, CKMB, CKMBINDEX, TROPONINI in the last 72 hours. Lab Results  Component Value Date   INR 1.32 06/25/2014   INR 1.30 11/22/2013   INR 1.27 11/21/2013   No results for input(s): TSH, T4TOTAL, T3FREE, THYROIDAB in the last 72 hours.  Invalid input(s): FREET3 No results for input(s): VITAMINB12, FOLATE, FERRITIN, TIBC, IRON, RETICCTPCT in the last 72 hours.  Radiological Exams on Admission: [place] Paracentesis  08/27/2014   INDICATION: Cirrhosis, recurrent ascites, request for paracentesis.  EXAM: ULTRASOUND-GUIDED PARACENTESIS  COMPARISON:  08/11/14 2.9 liters removed.  MEDICATIONS: None.  COMPLICATIONS: None immediate  TECHNIQUE: Informed written consent was obtained from the patient after a discussion of the risks, benefits and alternatives to treatment. A timeout was performed prior to the initiation of the procedure.  Initial ultrasound scanning demonstrates a moderate amount of ascites within the right upper abdominal quadrant. The right upper abdomen was prepped and draped in the usual sterile fashion. 1% lidocaine was used for local anesthesia. Under direct ultrasound guidance, a 19 gauge, 7-cm, Yueh catheter was introduced. An ultrasound image was saved for documentation purposed. The paracentesis was performed. The catheter was removed and a  dressing was applied. The patient tolerated the procedure well without immediate post procedural complication.  FINDINGS: A total of approximately 2.6 liters of serous fluid was removed.  IMPRESSION: Successful ultrasound-guided paracentesis yielding 2.6 liters of peritoneal fluid.  Read By:  Pattricia BossKoreen Morgan PA-C   Electronically Signed   By: Gilmer MorJaime  Wagner  D.O.   On: 08/27/2014 11:34   Dg Abd Acute W/chest  09/19/2014   CLINICAL DATA:  Vomiting today  EXAM: ACUTE ABDOMEN SERIES (ABDOMEN 2 VIEW & CHEST 1 VIEW)  COMPARISON:  03/28/2014, 03/29/2014  FINDINGS: Cardiac shadow is within normal limits. Postsurgical changes are noted. Mild interstitial changes are again seen but stable from the prior study. No new focal infiltrate is seen.  Scattered large and small bowel gas is noted. Considerable fecal material is noted within the colon. Multiple dilated loops of small bowel are identified predominately in the right abdomen. No free air is seen.  IMPRESSION: Small bowel obstructive change likely partial in nature given the degree of fecal material and air within the colon. No free air is noted. Correlation with physical exam is recommended.   Electronically Signed   By: Alcide CleverMark  Lukens M.D.   On: 09/19/2014 07:42   Orders placed or performed during the hospital encounter of 06/25/14  . EKG 12-Lead  . EKG 12-Lead    Assessment/Plan 1. Altered mental status-this is likely secondary to worsening hepatic encephalopathy. Differential diagnosis does include dehydration, SBP, or medications.  We'll try to minimize his sedating medications, hold diuretics and hydrate gently, and continue Xifaxan and lactulose for hepatic encephalopathy. His ammonia level is moderately elevated.  Consider diagnostic paracentesis to rule out SBP pending CT results. 2. Acute Renal Failure- likely secondary to volume depletion in the setting of poor by mouth intake. Monitor closely to rule out hepatorenal syndrome. 3. Small bowel  obstruction-CT the abdomen and pelvis is pending to evaluate further. This may be related to his ascites. 4. Atrial fibrillation- Anticoagulation due to bleeding risks with varices. Currently rate controlled without treatment. Consider beta blocker which may also help with portal hypertension (may have prior contraindications since he is not on it currently). Will hold for now and monitor his blood pressure. 5. Hepatic cirrhosis with Esophageal varices in cirrhosis, mild portal gastropathy, Ascites, Hepatic encephalopathy- poor prognosis. Consider palliative care/hospice options 6. Chronic combined systolic and diastolic CHF (congestive heart failure)-monitor volume status closely with hydration and withholding diuretic therapy. 7. Protein-calorie malnutrition, severe-poor prognostic sign in setting of his cirrhosis. Will add ensure supplements when tolerating medicatino. 8. Ethics- I discussed CODE STATUS with his wife. She states that he has a living will but is not certain that he would not want to be resuscitated. Encouraged her to consider DO NOT RESUSCITATE-we'll continue conversation.  Martha ClanShaw, Ruthann Angulo 09/19/2014, 11:11 AM

## 2014-09-19 NOTE — ED Notes (Signed)
Pt arrived to the ED with a complaint of weakness and dehydration.  Pt has had symptoms since Wednesday.  Pt has not been eating for several days.  Pt taking Lactalose for ammonia level.  Pt has liver failure.

## 2014-09-19 NOTE — ED Notes (Signed)
Pt is on cardiac monitoring,  Pt is alert and oriented in,  He has declined rapidly.  Pt also noted to have irregular heart beat

## 2014-09-19 NOTE — ED Notes (Signed)
Pt in XRAY 

## 2014-09-19 NOTE — Progress Notes (Signed)
Entered patient's room, found him covered in emesis, appeared to look like stool. On call provider notified. New orders as follows: hold all evening meds, diet NPO and zofran IV. Will continue to monitor patient.

## 2014-09-19 NOTE — Progress Notes (Signed)
Pt admitted from ED to 1324. No family present at this time, admission questions deferred until they arrive due to patient's mental status. Pt disoriented x4 with delayed response, inconsistently follows commands. Condom cath applied. Blanchable redness to pts sacrum, allevyn applied prophylactically. Pt moans and groans when turned but denies pain at this time. Mick SellShannon Asani Deniston RN.

## 2014-09-19 NOTE — ED Notes (Signed)
Pt returned from CT. Pt is resting comfortably.

## 2014-09-19 NOTE — ED Notes (Addendum)
Numbers for close friends to be reached are West BaliMary Anne [phone], danny [phone] and Home  Number is [phone]   Wife Windell MouldingRuth [phone] and cell [phone]

## 2014-09-19 NOTE — ED Notes (Signed)
Attempted to call report to 3W 

## 2014-09-19 NOTE — ED Notes (Signed)
Pt had small BM and was assisted with the use of the urinal by NT Verlon AuLeslie and this Clinical research associatewriter.

## 2014-09-19 NOTE — ED Notes (Signed)
MD Pickering at bedside.  

## 2014-09-19 NOTE — ED Notes (Signed)
Pt resting comfortable. 

## 2014-09-19 NOTE — ED Notes (Signed)
IV not working. Will start another

## 2014-09-19 NOTE — Plan of Care (Signed)
Problem: Phase I Progression Outcomes Goal: Voiding-avoid urinary catheter unless indicated Outcome: Completed/Met Date Met:  09/19/14     

## 2014-09-19 NOTE — ED Notes (Signed)
[facility].

## 2014-09-19 NOTE — ED Notes (Signed)
Assisted patient with the use of the urinal. Pt able to stand to side of bed with assistance.

## 2014-09-19 NOTE — ED Notes (Signed)
Pt in CT.

## 2014-09-19 NOTE — ED Provider Notes (Signed)
CSN: [id number]     Arrival date & time 09/19/14  0529 History   First MD Initiated Contact with Patient 09/19/14 [phone]     Chief Complaint  Patient presents with  . Dehydration  . Weakness     (Consider location/radiation/quality/duration/timing/severity/associated sxs/prior Treatment) HPI Mr. Kirk [name] is an 80 year old male with past medical history of liver cirrhosis and failure, GERD, diverticulosis, CAD, atrial fibrillation, hyperlipidemia, hypertension, pancreatitis who presents to the ER with weakness and dehydration for the past several days. Patient's family states they brought him in due to patient being "less responsive than usual and vomiting since yesterday". Family reports the patient has not been eating well for the past week, they state he has been on lactulose for "several months off and on". Patient is somnolent in the room, however easily arousable. Patient denies any specific complaints to me. Family denies patient having any recent illness or any other recent complaints other than the nausea and vomiting.   Past Medical History  Diagnosis Date  . GERD (gastroesophageal reflux disease)   . Diverticulosis     s/p partial colon resection  . CAD (coronary artery disease)     s/p MI 1999;  s/p CABG 2005 (L-LAD, S-Dx, S-OM1, S-PDA); patient had post op AFib;  Myoview 8/10: mild fixed lateral defect; no ischemia, EF 47%  . Hyperlipemia   . Ischemic cardiomyopathy     echo 1/11: mild MR, mild LAE, EF 40-45%;  echo 10/12: EF 40-45%, IL AK, Post AK, grade 2 diast dysfxn, mild to mod MR, mild BAE, PASP 38;   Echo 9/14:  Mild focal basal septal hypertrophy, EF 40-45%, IL, inf and post HK, Gr 1 DD, MAC, mild MR, mild LAE, mild RVE, mild RAE, PASP 32, L pleural effusion  . Hypertension   . Arthritis   . Common bile duct dilatation   . Pancreatitis     post-ERCP 11/2009  . Colitis, ischemic   . Chronic systolic heart failure     Echo 09/05/11: mild focal basal septal hypertrophy,  EF 40-45%, basal to mid IL AK, post AK, grade 2 diast dysfxn, mild to mod MR, mild BAE, PASP 38  . Atrial fibrillation     post CABG in 2005 and in setting of acute illness in 1/11 (post ERCP pancreatitis) - coumadin not felt to be indicated  . Lumbar spondylosis   . Elevated LFTs     statin d/c'd in past due to   . BPH (benign prostatic hyperplasia)   . Sinus bradycardia     on beta blockers  . Ulcer 10/2012    in pylorus  . Allergy   . Anemia   . Diverticulitis     with partial colectomy  . Cirrhosis     Negative workup for viral hepatitis, autoimmune hepatitis, hemochromatosis. Never heavy ETOH. Suspect NASH. EGD (12/13) with esophageal varices and portal hypertensive gastropathy. EGD (3/14) with 3 small esophageal varices, portal hypertensive gastropathy.  . Myocardial infarction 1991; 1999  . Esophageal varices in cirrhosis     Hattie Perch/notes 08/04/2013 (08/05/2013)  . Depression   . Anxiety   . Hyperkalemia requiring hospitalization 08/04/2013  . Cirrhosis   . Herpes zoster 03/27/2014   Past Surgical History  Procedure Laterality Date  . Colon resection      "removed 12-15' of colon" (08/05/2013)  . Back surgery    . Ercp  11/13/2009    dilated bile ducts, atypical cytology, repeat attempt normal pancreatogram but unsuccessful biliary cannulation  . Angioplasty  1999  . Shoulder open rotator cuff repair Bilateral     "I've had a total of 3 OR's for this" (08/05/2013)  . Cataract extraction w/ intraocular lens implant Right   . Esophagogastroduodenoscopy    . Flexible sigmoidoscopy    . Colonoscopy    . Tonsillectomy    . Coronary angioplasty with stent placement      "before the bypass" (08/05/2013)  . Coronary artery bypass graft  2005    "CABG X7" (08/05/2013)x 4  . Lumbar disc surgery     Family History  Problem Relation Age of Onset  . Diabetes Mother   . Heart disease Mother   . Congestive Heart Failure Mother   . Lung cancer Father    History  Substance Use Topics  .  Smoking status: Current Every Day Smoker -- 68 years    Types: Cigars  . Smokeless tobacco: Never Used     Comment: 08/05/2013 "probably 3 cigars/day"; form given 10-08-13  . Alcohol Use: No    Review of Systems  Unable to perform ROS: Acuity of condition      Allergies  Erythromycin and Penicillins  Home Medications   Prior to Admission medications   Medication Sig Start Date End Date Taking? Authorizing Provider  escitalopram (LEXAPRO) 10 MG tablet Take 5 mg by mouth 2 (two) times daily.    Yes Historical Provider, MD  furosemide (LASIX) 20 MG tablet Take 2 tablets (40 mg total) by mouth daily. 07/20/14  Yes Iva Boop, MD  gabapentin (NEURONTIN) 100 MG capsule Take 100 mg by mouth 2 (two) times daily as needed. For nerve pain   Yes Historical Provider, MD  hydrALAZINE (APRESOLINE) 25 MG tablet Take 25 mg by mouth 2 (two) times daily.   Yes Historical Provider, MD  lactulose (CHRONULAC) 10 GM/15ML solution Take 10 g by mouth daily.   Yes Historical Provider, MD  oxyCODONE (OXY IR/ROXICODONE) 5 MG immediate release tablet Take 5 mg by mouth every 4 (four) hours as needed for moderate pain or severe pain.   Yes Historical Provider, MD  pantoprazole (PROTONIX) 40 MG tablet Take 40 mg by mouth daily.   Yes Historical Provider, MD  rifaximin (XIFAXAN) 550 MG TABS tablet Take 550 mg by mouth 2 (two) times daily.   Yes Historical Provider, MD  spironolactone (ALDACTONE) 50 MG tablet Take 2 tablets (100 mg total) by mouth daily. 07/20/14  Yes Iva Boop, MD  triamcinolone cream (KENALOG) 0.1 % Apply 1 application topically 2 (two) times daily.   Yes Historical Provider, MD  triamcinolone cream (KENALOG) 0.5 % Apply 1 application topically 2 (two) times daily.   Yes Historical Provider, MD  hyoscyamine (LEVSIN SL) 0.125 MG SL tablet Place 0.125 mg under the tongue every 4 (four) hours as needed for cramping.    Historical Provider, MD  lidocaine (LIDODERM) 5 % Place 1 patch onto the skin  daily. Remove & Discard patch within 12 hours or as directed by MD 08/05/14   Iva Boop, MD  ondansetron (ZOFRAN) 4 MG tablet Take 4 mg by mouth every 6 (six) hours as needed for nausea or vomiting.    Historical Provider, MD  silver sulfADIAZINE (SILVADENE) 1 % cream Apply 1 application topically daily.    Historical Provider, MD   BP 101/56 mmHg  Pulse 78  Temp(Src) 98.3 F (36.8 C) (Oral)  Resp 20  SpO2 97% Physical Exam  Constitutional: He appears lethargic. He does not have a sickly appearance.  He does not appear ill.  Frail-appearing elderly male lying semi-Fowlers on the stretcher, somnolent, however easily arousable to voice. Patient in no obvious distress.  HENT:  Head: Normocephalic and atraumatic.  Mouth/Throat: Uvula is midline. Mucous membranes are dry. No oropharyngeal exudate.  Eyes: Right eye exhibits no discharge. Left eye exhibits no discharge. No scleral icterus.  Neck: Normal range of motion.  Cardiovascular: S1 normal, normal heart sounds and normal pulses.  An irregularly irregular rhythm present. Tachycardia present.   No murmur heard. Patient tachycardic between 100 and 120 on exam.  Pulmonary/Chest: Effort normal and breath sounds normal. No accessory muscle usage. No tachypnea. No respiratory distress.  Abdominal: He exhibits distension and ascites. He exhibits no fluid wave. There is generalized tenderness. There is no rigidity, no guarding, no tenderness at McBurney's point and negative Murphy's sign.  Mild abdominal distention with generalized tenderness noted.  Musculoskeletal: Normal range of motion. He exhibits no edema or tenderness.  Neurological: He appears lethargic. No cranial nerve deficit. GCS eye subscore is 4. GCS verbal subscore is 4. GCS motor subscore is 6.  Skin: Skin is warm and dry. No rash noted. He is not diaphoretic.  Psychiatric: He has a normal mood and affect.  Nursing note and vitals reviewed.   ED Course  Procedures (including  critical care time) Labs Review Labs Reviewed  CBC WITH DIFFERENTIAL - Abnormal; Notable for the following:    WBC 11.5 (*)    RBC 2.90 (*)    Hemoglobin 9.1 (*)    HCT 26.0 (*)    RDW 15.7 (*)    Neutrophils Relative % 82 (*)    Neutro Abs 9.5 (*)    Lymphocytes Relative 9 (*)    All other components within normal limits  COMPREHENSIVE METABOLIC PANEL - Abnormal; Notable for the following:    Sodium 133 (*)    Glucose, Bld 125 (*)    BUN 44 (*)    Creatinine, Ser 1.87 (*)    Albumin 2.1 (*)    Alkaline Phosphatase 157 (*)    GFR calc non Af Amer 31 (*)    GFR calc Af Amer 36 (*)    All other components within normal limits  PROTIME-INR - Abnormal; Notable for the following:    Prothrombin Time 17.2 (*)    All other components within normal limits  AMMONIA  URINALYSIS, ROUTINE W REFLEX MICROSCOPIC  LIPASE, BLOOD  I-STAT CG4 LACTIC ACID, ED    Imaging Review Ct Abdomen Pelvis Wo Contrast  09/19/2014   CLINICAL DATA:  Abdominal pain weakness, dehydration not eating for several days, liver failure  EXAM: CT ABDOMEN AND PELVIS WITHOUT CONTRAST  TECHNIQUE: Multidetector CT imaging of the abdomen and pelvis was performed following the standard protocol without IV contrast.  COMPARISON:  08/06/2012.  FINDINGS: Lung bases shows bilateral basilar atelectasis. Sagittal images of the spine shows diffuse osteopenia. Degenerative changes are noted thoracolumbar spine. Small left pleural effusion is noted. Mild distended gallbladder without calcified gallstones.  There is moderate to significant abdominal ascites. Markedly atrophic liver with nodular consistent with cirrhosis. The spleen measures 9.8 cm in length. Prominent size portal vein measures 1.6 cm in diameter consistent with portal hypertension. The stomach is empty collapsed.  Mild distended small bowel loops are noted in right lower quadrant. Distended terminal small bowel loops are noted with fecal like material. Findings are  consistent with significant ileus or early bowel obstruction. There is a thickened wall left inguinal hernia containing ascites. The  hernia is mild distended measures about 5.2 cm. No bowel loops are identified within hernia. There is a distended rectum with significant stool measures 8 cm in diameter. Findings highly suspicious for fecal impaction. Small pelvic ascites is noted. Some stool noted in transverse colon. Extensive atherosclerotic calcifications of abdominal aorta, SMA and iliac arteries. No aortic aneurysm. Splenic artery calcifications. There is thickening of right colonic wall as seen in axial image 56 probable post cirrhosis and portal hypertension  Unenhanced kidneys are symmetrical in size. Nonobstructive calcification in upper pole of the left kidney measures 2.5 mm. No hydronephrosis or hydroureter.  IMPRESSION: 1. There is cirrhotic atrophic liver. Large abdominal ascites. Prominent size portal vein measures 1.6 cm in diameter. Findings are consistent with cirrhosis with portal hypertension. Thickening of right colonic wall probable secondary to cirrhosis. 2. There are distended small bowel loops in right lower quadrant. Fecal like material noted in terminal small bowel loops. Findings are consistent with significant ileus or early bowel obstruction. 3. There is a thickened wall mild distended right inguinal hernia containing ascites. No bowel loops are noted within hernia. 4. Significant distension of the rectum with stool measures 8 x 7.2 cm in diameter consistent with fecal impaction. 5. Left nonobstructive nephrolithiasis. No hydronephrosis or hydroureter.   Electronically Signed   By: Natasha Mead M.D.   On: 09/19/2014 11:28   Dg Abd Acute W/chest  09/19/2014   CLINICAL DATA:  Vomiting today  EXAM: ACUTE ABDOMEN SERIES (ABDOMEN 2 VIEW & CHEST 1 VIEW)  COMPARISON:  03/28/2014, 03/29/2014  FINDINGS: Cardiac shadow is within normal limits. Postsurgical changes are noted. Mild interstitial  changes are again seen but stable from the prior study. No new focal infiltrate is seen.  Scattered large and small bowel gas is noted. Considerable fecal material is noted within the colon. Multiple dilated loops of small bowel are identified predominately in the right abdomen. No free air is seen.  IMPRESSION: Small bowel obstructive change likely partial in nature given the degree of fecal material and air within the colon. No free air is noted. Correlation with physical exam is recommended.   Electronically Signed   By: Alcide Clever M.D.   On: 09/19/2014 07:42     EKG Interpretation None      MDM   Final diagnoses:  Vomiting  Abdominal pain    80 year old male with past medical history of liver cirrhosis presenting with one day of nausea, vomiting, decrease in mental status is reported by family. Family reporting patient is acting similar to previous events 1 patient's ammonia level is high. Family stating patient has been taking lactulose, however has been also vomiting frequently. Patient's daughter reporting patient has had poor by mouth intake over the past week. Workup for rule out of hepatic encephalopathy, dehydration, bowel obstruction.  UA unremarkable. Unremarkable leukocytosis at 11.5. Mild anemia at 9.1. Acute kidney injury noted most likely due to dehydration. Alkaline phosphatase elevated at 157. Lactic acid within normal limits.  Acute abdomen with chest radiograph remarkable for impression: Small bowel obstructive change likely partial in nature given the degree of fecal material and air within the colon. No free air is noted. Correlation with physical exam is recommended.  Patient's physical exam was remarkable for generalized abdominal pain with mild distention. Patient also has history of ascites. Will follow-up with CT abdomen pelvis with contrast.  CT abdomen pelvis with impression: 1. There is cirrhotic atrophic liver. Large abdominal ascites. Prominent size  portal vein measures 1.6 cm in  diameter. Findings are consistent with cirrhosis with portal hypertension. Thickening of right colonic wall probable secondary to cirrhosis. 2. There are distended small bowel loops in right lower quadrant. Fecal like material noted in terminal small bowel loops. Findings are consistent with significant ileus or early bowel obstruction. 3. There is a thickened wall mild distended right inguinal hernia containing ascites. No bowel loops are noted within hernia. 4. Significant distension of the rectum with stool measures 8 x 7.2 cm in diameter consistent with fecal impaction. 5. Left nonobstructive nephrolithiasis. No hydronephrosis or Hydroureter.  Based on these results of possible early bowel obstruction and patient's presentation with decrease in mental statusacutely over the past several days, we will place consult to have patient admitted.The patient appears reasonably stabilized for admission considering the current resources, flow, and capabilities available in the ED at this time, and I doubt any [facility] further screening and/or treatment in the ED prior to admission.  BP 101/56 mmHg  Pulse 78  Temp(Src) 98.3 F (36.8 C) (Oral)  Resp 20  SpO2 97%  Signed,  Kirk MowJoe Nubia Ziesmer, PA-C 4:31 PM  This patient seen and discussed with Dr. Benjiman CoreNathan Pickering, M.D.  Kirk FantasiaJoseph W Alyss Granato, PA-C 09/19/14 1631  Kirk RudeNathan R. Rubin PayorPickering, MD 09/21/14 1358

## 2014-09-20 ENCOUNTER — Inpatient Hospital Stay (HOSPITAL_COMMUNITY): Payer: Medicare Other

## 2014-09-20 DIAGNOSIS — N179 Acute kidney failure, unspecified: Secondary | ICD-10-CM | POA: Diagnosis present

## 2014-09-20 LAB — COMPREHENSIVE METABOLIC PANEL
ALBUMIN: 2 g/dL — AB (ref 3.5–5.2)
ALK PHOS: 149 U/L — AB (ref 39–117)
ALT: 14 U/L (ref 0–53)
AST: 34 U/L (ref 0–37)
Anion gap: 14 (ref 5–15)
BUN: 46 mg/dL — ABNORMAL HIGH (ref 6–23)
CO2: 23 mEq/L (ref 19–32)
Calcium: 8.5 mg/dL (ref 8.4–10.5)
Chloride: 101 mEq/L (ref 96–112)
Creatinine, Ser: 1.55 mg/dL — ABNORMAL HIGH (ref 0.50–1.35)
GFR calc Af Amer: 45 mL/min — ABNORMAL LOW (ref >90)
GFR calc non Af Amer: 39 mL/min — ABNORMAL LOW (ref >90)
GLUCOSE: 112 mg/dL — AB (ref 70–99)
Potassium: 4.1 mEq/L (ref 3.7–5.3)
SODIUM: 138 meq/L (ref 137–147)
TOTAL PROTEIN: 7.4 g/dL (ref 6.0–8.3)
Total Bilirubin: 1.1 mg/dL (ref 0.3–1.2)

## 2014-09-20 LAB — CBC
HEMATOCRIT: 26.2 % — AB (ref 39.0–52.0)
Hemoglobin: 8.9 g/dL — ABNORMAL LOW (ref 13.0–17.0)
MCH: 30 pg (ref 26.0–34.0)
MCHC: 34 g/dL (ref 30.0–36.0)
MCV: 88.2 fL (ref 78.0–100.0)
Platelets: 201 10*3/uL (ref 150–400)
RBC: 2.97 MIL/uL — ABNORMAL LOW (ref 4.22–5.81)
RDW: 15.8 % — ABNORMAL HIGH (ref 11.5–15.5)
WBC: 10.4 10*3/uL (ref 4.0–10.5)

## 2014-09-20 MED ORDER — DEXTROSE 5 % IV SOLN
1.0000 g | INTRAVENOUS | Status: DC
  Administered 2014-09-20 – 2014-09-22 (×3): 1 g via INTRAVENOUS
  Filled 2014-09-20 (×3): qty 1

## 2014-09-20 MED ORDER — METRONIDAZOLE IN NACL 5-0.79 MG/ML-% IV SOLN
500.0000 mg | Freq: Three times a day (TID) | INTRAVENOUS | Status: DC
  Administered 2014-09-20 – 2014-09-23 (×10): 500 mg via INTRAVENOUS
  Filled 2014-09-20 (×11): qty 100

## 2014-09-20 MED ORDER — METOPROLOL TARTRATE 1 MG/ML IV SOLN
5.0000 mg | Freq: Four times a day (QID) | INTRAVENOUS | Status: DC
  Administered 2014-09-20 – 2014-09-22 (×7): 5 mg via INTRAVENOUS
  Filled 2014-09-20 (×15): qty 5

## 2014-09-20 NOTE — Plan of Care (Signed)
Problem: Phase III Progression Outcomes Goal: Foley discontinued Outcome: Not Applicable Date Met:  54/49/20

## 2014-09-20 NOTE — Progress Notes (Signed)
Morning vitals signs checked pt ST. Dr. Clelia CroftShaw notified. Per telephone with read back orders were placed. Will continue to monitor.

## 2014-09-20 NOTE — Progress Notes (Addendum)
Subjective: RN reports that patient vomitted last evening.  Called on-call for [facility] who held medications and made NPO.  1 BM recorded yesterday.  Patient is slightly more alert this am.  Objective: Vital signs in last 24 hours: Temp:  [97.6 F (36.4 C)-98.3 F (36.8 C)] 97.7 F (36.5 C) (11/15 0446) Pulse Rate:  [70-155] 123 (11/15 0446) Resp:  [18-24] 24 (11/15 0446) BP: (99-140)/(45-77) 132/45 mmHg (11/15 0446) SpO2:  [93 %-97 %] 93 % (11/15 0446) Weight:  [49.3 kg (108 lb 11 oz)-50.4 kg (111 lb 1.8 oz)] 50.4 kg (111 lb 1.8 oz) (11/15 0446) Weight change:  Last BM Date: 09/19/14  CBG (last 3)  No results for input(s): GLUCAP in the last 72 hours.  Intake/Output from previous day: 11/14 0701 - 11/15 0700 In: 1188.8 [I.V.:1188.8] Out: 100 [Urine:100] Intake/Output this shift:    General appearance: slightly more alert, verbalizes minimally and opens his eyes to verbal stimulus; frail cachectic Eyes: no scleral icterus Throat: oropharynx dry Resp: clear to auscultation bilaterally Cardio: irregularly irregular rhythm GI: distended with increasing ascites; diffuse tenderness; no rebound or guarding but appears in discomfort with palpation; bowel sounds present throughout Extremities: no clubbing, cyanosis or edema   Lab Results:  Recent Labs  09/19/14 0748 09/20/14 0416  NA 133* 138  K 4.8 4.1  CL 96 101  CO2 26 23  GLUCOSE 125* 112*  BUN 44* 46*  CREATININE 1.87* 1.55*  CALCIUM 8.5 8.5    Recent Labs  09/19/14 0748 09/20/14 0416  AST 29 34  ALT 15 14  ALKPHOS 157* 149*  BILITOT 0.9 1.1  PROT 7.8 7.4  ALBUMIN 2.1* 2.0*    Recent Labs  09/19/14 0748 09/20/14 0416  WBC 11.5* 10.4  NEUTROABS 9.5*  --   HGB 9.1* 8.9*  HCT 26.0* 26.2*  MCV 89.7 88.2  PLT 161 201   Lab Results  Component Value Date   INR 1.39 09/19/2014   INR 1.32 06/25/2014   INR 1.30 11/22/2013   No results for input(s): CKTOTAL, CKMB, CKMBINDEX, TROPONINI in  the last 72 hours. No results for input(s): TSH, T4TOTAL, T3FREE, THYROIDAB in the last 72 hours.  Invalid input(s): FREET3 No results for input(s): VITAMINB12, FOLATE, FERRITIN, TIBC, IRON, RETICCTPCT in the last 72 hours.  Studies/Results: Ct Abdomen Pelvis Wo Contrast  09/19/2014   CLINICAL DATA:  Abdominal pain weakness, dehydration not eating for several days, liver failure  EXAM: CT ABDOMEN AND PELVIS WITHOUT CONTRAST  TECHNIQUE: Multidetector CT imaging of the abdomen and pelvis was performed following the standard protocol without IV contrast.  COMPARISON:  08/06/2012.  FINDINGS: Lung bases shows bilateral basilar atelectasis. Sagittal images of the spine shows diffuse osteopenia. Degenerative changes are noted thoracolumbar spine. Small left pleural effusion is noted. Mild distended gallbladder without calcified gallstones.  There is moderate to significant abdominal ascites. Markedly atrophic liver with nodular consistent with cirrhosis. The spleen measures 9.8 cm in length. Prominent size portal vein measures 1.6 cm in diameter consistent with portal hypertension. The stomach is empty collapsed.  Mild distended small bowel loops are noted in right lower quadrant. Distended terminal small bowel loops are noted with fecal like material. Findings are consistent with significant ileus or early bowel obstruction. There is a thickened wall left inguinal hernia containing ascites. The hernia is mild distended measures about 5.2 cm. No bowel loops are identified within hernia. There is a distended rectum with significant stool measures 8 cm in diameter. Findings highly suspicious for  fecal impaction. Small pelvic ascites is noted. Some stool noted in transverse colon. Extensive atherosclerotic calcifications of abdominal aorta, SMA and iliac arteries. No aortic aneurysm. Splenic artery calcifications. There is thickening of right colonic wall as seen in axial image 56 probable post cirrhosis and portal  hypertension  Unenhanced kidneys are symmetrical in size. Nonobstructive calcification in upper pole of the left kidney measures 2.5 mm. No hydronephrosis or hydroureter.  IMPRESSION: 1. There is cirrhotic atrophic liver. Large abdominal ascites. Prominent size portal vein measures 1.6 cm in diameter. Findings are consistent with cirrhosis with portal hypertension. Thickening of right colonic wall probable secondary to cirrhosis. 2. There are distended small bowel loops in right lower quadrant. Fecal like material noted in terminal small bowel loops. Findings are consistent with significant ileus or early bowel obstruction. 3. There is a thickened wall mild distended right inguinal hernia containing ascites. No bowel loops are noted within hernia. 4. Significant distension of the rectum with stool measures 8 x 7.2 cm in diameter consistent with fecal impaction. 5. Left nonobstructive nephrolithiasis. No hydronephrosis or hydroureter.   Electronically Signed   By: Natasha MeadLiviu  Pop M.D.   On: 09/19/2014 11:28   Dg Abd Acute W/chest  09/19/2014   CLINICAL DATA:  Vomiting today  EXAM: ACUTE ABDOMEN SERIES (ABDOMEN 2 VIEW & CHEST 1 VIEW)  COMPARISON:  03/28/2014, 03/29/2014  FINDINGS: Cardiac shadow is within normal limits. Postsurgical changes are noted. Mild interstitial changes are again seen but stable from the prior study. No new focal infiltrate is seen.  Scattered large and small bowel gas is noted. Considerable fecal material is noted within the colon. Multiple dilated loops of small bowel are identified predominately in the right abdomen. No free air is seen.  IMPRESSION: Small bowel obstructive change likely partial in nature given the degree of fecal material and air within the colon. No free air is noted. Correlation with physical exam is recommended.   Electronically Signed   By: Alcide CleverMark  Lukens M.D.   On: 09/19/2014 07:42     Medications: Scheduled: . escitalopram  5 mg Oral BID  . lactulose  30 g Oral TID   . pantoprazole (PROTONIX) IV  40 mg Intravenous Q24H  . rifaximin  550 mg Oral BID   Continuous: . sodium chloride 75 mL/hr at 09/20/14 0420    Assessment/Plan: Active Problems: 1.  Alterede mental status- multifactorial secondary to hepatic encephalopathy, acute renal failure/volume depletion, SBO.  Doubt SBP.  Slight improvement with hydration.  Continue supportive care and treatment as below. 2.Hepatic cirrhosis with Ascites, Hepatic encephalopathy, Esophageal varices in cirrhosis, mild portal gastropathy- ascites is worsening with IV fluids and with holding diuretics but he appears intravascularly depleted.  Will continue gentle hydration.  Anticipate need for paracentesis tomorrow.  Low suspicion of SBP (no fever, WBC improved) but would send fluid for analysis to rule out.  Encephalopathy slightly better though N/V with SBO limit oral medication.  Will try to continue Xifaxan orally and hold lactulose until tolerating diet.  Add metronidazole IV (limited data in Hepatic Encephalopathy).  Resume Lactulose when tolerating diet. 3. Atrial fibrillation with RVR- will add Lopressor 5mg  IV q6 hours for rate control.  No anticoagulation due to bleeding risk.  Place on telemetry. 4. Small bowel obstruction- CT confirms possible SBO vs. Ileus though bowel sounds are present throughout.  Will keep NPO today and use antieemetics as needed.  Repeat KUB today. 5. Acute Renal Failure-  Slight improvement with hydration.  Monitor. 6. Chronic  combined systolic and diastolic CHF (congestive heart failure)- monitor volume status with fluids 7. Protein-calorie malnutrition, severe- add supplements when able to take pos. 8. Ethics- continue discussion regarding code status and goals of care.  Prognosis is poor and Hospice would be appropriate.  Defer to PCP.   LOS: 1 day   Martha ClanShaw, Rasheen Schewe 09/20/2014, 7:14 AM   CXR reveals Patchy airspace opacity in the right lower lobe concerning for pneumonia or  pneumonitis. Aspiration is a possibility given the clinical history.  Will add Cefepime to cover anaerobes and pneumonia.

## 2014-09-20 NOTE — Progress Notes (Signed)
ANTIBIOTIC CONSULT NOTE - INITIAL  Pharmacy Consult for cefepime Indication: HCAP with possible aspiration  Allergies  Allergen Reactions  . Erythromycin Itching and Rash    Patient allergic to all Mycins   . Penicillins Rash    Patient Measurements: Height: 5\' 7"  (170.2 cm) Weight: 111 lb 1.8 oz (50.4 kg) IBW/kg (Calculated) : 66.1  Vital Signs: Temp: 98.6 F (37 C) (11/15 0914) Temp Source: Axillary (11/15 0914) BP: 112/52 mmHg (11/15 0914) Pulse Rate: 107 (11/15 0914) Intake/Output from previous day: 11/14 0701 - 11/15 0700 In: 1188.8 [I.V.:1188.8] Out: 100 [Urine:100] Intake/Output from this shift:    Labs:  Recent Labs  09/19/14 0748 09/20/14 0416  WBC 11.5* 10.4  HGB 9.1* 8.9*  PLT 161 201  CREATININE 1.87* 1.55*   Estimated Creatinine Clearance: 24.8 mL/min (by C-G formula based on Cr of 1.55). No results for input(s): VANCOTROUGH, VANCOPEAK, VANCORANDOM, GENTTROUGH, GENTPEAK, GENTRANDOM, TOBRATROUGH, TOBRAPEAK, TOBRARND, AMIKACINPEAK, AMIKACINTROU, AMIKACIN in the last 72 hours.   Microbiology: No results found for this or any previous visit (from the past 720 hour(s)).  Medical History: Past Medical History  Diagnosis Date  . GERD (gastroesophageal reflux disease)   . Diverticulosis     s/p partial colon resection  . CAD (coronary artery disease)     s/p MI 1999;  s/p CABG 2005 (L-LAD, S-Dx, S-OM1, S-PDA); patient had post op AFib;  Myoview 8/10: mild fixed lateral defect; no ischemia, EF 47%  . Hyperlipemia   . Ischemic cardiomyopathy     echo 1/11: mild MR, mild LAE, EF 40-45%;  echo 10/12: EF 40-45%, IL AK, Post AK, grade 2 diast dysfxn, mild to mod MR, mild BAE, PASP 38;   Echo 9/14:  Mild focal basal septal hypertrophy, EF 40-45%, IL, inf and post HK, Gr 1 DD, MAC, mild MR, mild LAE, mild RVE, mild RAE, PASP 32, L pleural effusion  . Hypertension   . Arthritis   . Common bile duct dilatation   . Pancreatitis     post-ERCP 11/2009  .  Colitis, ischemic   . Chronic systolic heart failure     Echo 09/05/11: mild focal basal septal hypertrophy, EF 40-45%, basal to mid IL AK, post AK, grade 2 diast dysfxn, mild to mod MR, mild BAE, PASP 38  . Atrial fibrillation     post CABG in 2005 and in setting of acute illness in 1/11 (post ERCP pancreatitis) - coumadin not felt to be indicated  . Lumbar spondylosis   . Elevated LFTs     statin d/c'd in past due to   . BPH (benign prostatic hyperplasia)   . Sinus bradycardia     on beta blockers  . Ulcer 10/2012    in pylorus  . Allergy   . Anemia   . Diverticulitis     with partial colectomy  . Cirrhosis     Negative workup for viral hepatitis, autoimmune hepatitis, hemochromatosis. Never heavy ETOH. Suspect NASH. EGD (12/13) with esophageal varices and portal hypertensive gastropathy. EGD (3/14) with 3 small esophageal varices, portal hypertensive gastropathy.  . Myocardial infarction 1991; 1999  . Esophageal varices in cirrhosis     Hattie Perch/notes 08/04/2013 (08/05/2013)  . Depression   . Anxiety   . Hyperkalemia requiring hospitalization 08/04/2013  . Cirrhosis   . Herpes zoster 03/27/2014    Medications:  Scheduled:  . metoprolol  5 mg Intravenous 4 times per day  . metronidazole  500 mg Intravenous Q8H  . pantoprazole (PROTONIX) IV  40  mg Intravenous Q24H  . rifaximin  550 mg Oral BID   Infusions:  . sodium chloride 75 mL/hr at 09/20/14 [id number]   Assessment: 80 yo with cryptogenic cirrhosis with multiple complications secondary to that presented to ER on 11/14 with declining mental status over past 2-3 days. Patient now with possible HCAP along with aspiration to start Flagyl per Md dosing and cefepime per pharmacy dosing.  11/15 >> Flagyl >> 11/15 >> cefepime >>  Tmax: afebrile WBCs: 10.4 Renal: Scr 1.55, improved. CrCl 25 ml/min  Goal of Therapy:  treatment of PNA  Plan:  Cefepime 1g IV q24 for CrCl 10-30 ml/min   Hessie KnowsJustin M Lavida Patch, PharmD, BCPS Pager  [phone] 09/20/2014 10:42 AM

## 2014-09-21 LAB — COMPREHENSIVE METABOLIC PANEL
ALT: 13 U/L (ref 0–53)
ANION GAP: 10 (ref 5–15)
AST: 26 U/L (ref 0–37)
Albumin: 1.7 g/dL — ABNORMAL LOW (ref 3.5–5.2)
Alkaline Phosphatase: 111 U/L (ref 39–117)
BUN: 48 mg/dL — AB (ref 6–23)
CO2: 23 mEq/L (ref 19–32)
Calcium: 8.3 mg/dL — ABNORMAL LOW (ref 8.4–10.5)
Chloride: 107 mEq/L (ref 96–112)
Creatinine, Ser: 1.35 mg/dL (ref 0.50–1.35)
GFR calc non Af Amer: 46 mL/min — ABNORMAL LOW (ref >90)
GFR, EST AFRICAN AMERICAN: 54 mL/min — AB (ref >90)
Glucose, Bld: 119 mg/dL — ABNORMAL HIGH (ref 70–99)
Potassium: 3.6 mEq/L — ABNORMAL LOW (ref 3.7–5.3)
Sodium: 140 mEq/L (ref 137–147)
TOTAL PROTEIN: 6.6 g/dL (ref 6.0–8.3)
Total Bilirubin: 0.6 mg/dL (ref 0.3–1.2)

## 2014-09-21 LAB — CBC
HCT: 24.5 % — ABNORMAL LOW (ref 39.0–52.0)
HEMOGLOBIN: 8.2 g/dL — AB (ref 13.0–17.0)
MCH: 30.8 pg (ref 26.0–34.0)
MCHC: 33.5 g/dL (ref 30.0–36.0)
MCV: 92.1 fL (ref 78.0–100.0)
Platelets: 126 10*3/uL — ABNORMAL LOW (ref 150–400)
RBC: 2.66 MIL/uL — AB (ref 4.22–5.81)
RDW: 16.3 % — ABNORMAL HIGH (ref 11.5–15.5)
WBC: 14.7 10*3/uL — ABNORMAL HIGH (ref 4.0–10.5)

## 2014-09-21 MED ORDER — OXYCODONE HCL 5 MG PO TABS
5.0000 mg | ORAL_TABLET | ORAL | Status: DC | PRN
  Administered 2014-09-21 – 2014-09-24 (×13): 5 mg via ORAL
  Filled 2014-09-21 (×13): qty 1

## 2014-09-21 MED ORDER — BOOST / RESOURCE BREEZE PO LIQD
1.0000 | Freq: Three times a day (TID) | ORAL | Status: DC
  Administered 2014-09-21 – 2014-09-24 (×9): 1 via ORAL

## 2014-09-21 MED ORDER — LACTULOSE 10 GM/15ML PO SOLN
10.0000 g | Freq: Two times a day (BID) | ORAL | Status: DC
  Administered 2014-09-21 – 2014-09-22 (×4): 10 g via ORAL
  Filled 2014-09-21 (×6): qty 15

## 2014-09-21 NOTE — Evaluation (Signed)
Occupational Therapy Evaluation Patient Details Name: Kirk [name] MRN: [id number] DOB: October 15, 1927 Today's Date: 09/21/2014    History of Present Illness Mr. Kirk [name] is an 80 year old white male with cryptogenic cirrhosis complicated by varices, ascites, and encephalopathy, ischemic cardiomyopathy (EF 40%) who presented to the emergency department with declining mental status over the past 2-3 days. Patient has had cirrhosis with multiple complications   Clinical Impression   Pt able to transfer to EOB and step around to chair with walker with min guard assist and increased time but reports tasks are fatiguing. He is limited by 5/10 back pain at rest and with activity. Note in chart, consideration of hospice also. Will follow on acute to progress ADL independence.     Follow Up Recommendations  Home health OT;Other (comment) (depending on progress and family ability to assist. Note potential for hospice. )    Equipment Recommendations  3 in 1 bedside comode    Recommendations for Other Services       Precautions / Restrictions Precautions Precautions: Fall      Mobility Bed Mobility Overal bed mobility: Needs Assistance Bed Mobility: Supine to Sit     Supine to sit: HOB elevated;Min guard     General bed mobility comments: increased time  Transfers Overall transfer level: Needs assistance Equipment used: Rolling walker (2 wheeled) Transfers: Sit to/from Stand Sit to Stand: Min guard         General transfer comment: increased time, verbal cues for hand placement.    Balance                                            ADL Overall ADL's : Needs assistance/impaired Eating/Feeding: Independent (water cup. clear liquid diet)   Grooming: Wash/dry hands;Set up;Sitting   Upper Body Bathing: Set up;Supervision/ safety;Sitting   Lower Body Bathing: Minimal assistance;Sit to/from stand   Upper Body Dressing : Set up;Sitting;Supervision/safety    Lower Body Dressing: Minimal assistance;Sit to/from stand Lower Body Dressing Details (indicate cue type and reason): difficulty getting to R foot to doff R sock. Pt states R LE is stiffer than L.  Toilet Transfer: Min Medical illustratorguard;Stand-pivot;RW   Toileting- Clothing Manipulation and Hygiene: Minimal assistance;Sit to/from stand         General ADL Comments: Pt fatigues with attempts to doff R sock from chair. HR 80s at rest and with activity. O2 sats on RA 94% with activity. Pt needs verbal cues for staying closer to walker and for hand placement during functional transfers.      Vision                     Perception     Praxis      Pertinent Vitals/Pain Pain Assessment: 0-10 Pain Score: 5  Pain Location: back  Pain Descriptors / Indicators: Aching Pain Intervention(s): Repositioned (nursing in room to give pan meds)     Hand Dominance     Extremity/Trunk Assessment Upper Extremity Assessment Upper Extremity Assessment: Generalized weakness           Communication Communication Communication: HOH   Cognition Arousal/Alertness: Awake/alert Behavior During Therapy: WFL for tasks assessed/performed Overall Cognitive Status: Within Functional Limits for tasks assessed                     General Comments  Exercises       Shoulder Instructions      Home Living Family/patient expects to be discharged to:: Other (Comment) (depends on progress, family support and note potential hospice) Living Arrangements: Spouse/significant other Available Help at Discharge: Family;Available 24 hours/day Type of Home: House Home Access: Level entry     Home Layout: One level     Bathroom Shower/Tub: Producer, television/film/videoWalk-in shower   Bathroom Toilet: Standard     Home Equipment: Cane - single point;Grab bars - toilet;Grab bars - tub/shower          Prior Functioning/Environment Level of Independence: Independent with assistive device(s)             OT  Diagnosis: Generalized weakness   OT Problem List: Decreased strength;Decreased knowledge of use of DME or AE;Decreased activity tolerance   OT Treatment/Interventions: Self-care/ADL training;Patient/family education;Therapeutic activities;DME and/or AE instruction    OT Goals(Current goals can be found in the care plan section) Acute Rehab OT Goals Patient Stated Goal: agreeable to work with OT OT Goal Formulation: With patient Time For Goal Achievement: 10/05/14 Potential to Achieve Goals: Good  OT Frequency: Min 2X/week   Barriers to D/C:            Co-evaluation              End of Session Equipment Utilized During Treatment: Gait belt;Rolling walker  Activity Tolerance: Patient limited by fatigue Patient left: in chair;with call bell/phone within reach;with chair alarm set   Time: 1610-96041043-1120 OT Time Calculation (min): 37 min Charges:  OT General Charges $OT Visit: 1 Procedure OT Evaluation $Initial OT Evaluation Tier I: 1 Procedure OT Treatments $Self Care/Home Management : 8-22 mins $Therapeutic Activity: 8-22 mins G-Codes:    Lennox LaityStone, Jerah Esty Stafford  [phone] 09/21/2014, 11:32 AM

## 2014-09-21 NOTE — Progress Notes (Signed)
Assumed care of patient at this time, agree with previous RN's assessment. Patient resting in bed at this time. Will continue to monitor and follow plan of care.

## 2014-09-21 NOTE — Plan of Care (Signed)
Problem: Phase I Progression Outcomes Goal: OOB as tolerated unless otherwise ordered Outcome: Progressing Goal: Initial discharge plan identified Outcome: Progressing Goal: Hemodynamically stable Outcome: Progressing Goal: Other Phase I Outcomes/Goals Outcome: Progressing  Problem: Phase II Progression Outcomes Goal: Progress activity as tolerated unless otherwise ordered Outcome: Progressing Goal: Discharge plan established Outcome: Progressing Goal: Vital signs remain stable Outcome: Progressing

## 2014-09-21 NOTE — Progress Notes (Signed)
CARE MANAGEMENT NOTE 09/21/2014  Patient:  Kirk [name],Kirk [name]   Account Number:  [id number]  Date Initiated:  09/21/2014  Documentation initiated by:  Trinna BalloonMcGIBBONEY,COOKIE Aide Wojnar  Subjective/Objective Assessment:   80yo pt with a hx of cryptogenic cirrhosis complicated by varices, ascites, and encephalopathy, ischemic cardiomyopathy (EF 40%).  Presented to the emergency department with declining mental status over the past 2-3 days     Action/Plan:   from home   Anticipated DC Date:  09/23/2014   Anticipated DC Plan:  HOME W HOME HEALTH SERVICES      DC Planning Services  CM consult      Choice offered to / List presented to:             Status of service:  In process, will continue to follow Medicare Important Message given?  YES (If response is "NO", the following Medicare IM given date fields will be blank) Date Medicare IM given:  09/21/2014 Medicare IM given by:  Trinna BalloonMcGIBBONEY,COOKIE Somaya Grassi Date Additional Medicare IM given:   Additional Medicare IM given by:    Discharge Disposition:    Per UR Regulation:  Reviewed for med. necessity/level of care/duration of stay  If discussed at Long Length of Stay Meetings, dates discussed:    Comments:  09/21/14 MMcGibboney, RN, BSN Chart reviewed. spoke with pt concerning discharge plans. Pt states that he is not sure where he is going at discharge. Pt in to evaluate.

## 2014-09-21 NOTE — Evaluation (Signed)
Physical Therapy Evaluation Patient Details Name: Kirk [name] MRN: [id number] DOB: August 15, 1927 Today's Date: 09/21/2014   History of Present Illness  Mr. Kirk [name] is an [age] year old white male with cryptogenic cirrhosis complicated by varices, ascites, and encephalopathy, ischemic cardiomyopathy (EF 40%) who presented to the emergency department with declining mental status over the past 2-3 days. Patient has had cirrhosis with multiple complications  Clinical Impression  On eval, pt required Min assist for mobility-able to stand with use of RW for support. Pt felt too fatigued to attempt ambulation this session. Family present however wife stated she was unsure of d/c plan. Will follow during hospital stay and progress activity as able.     Follow Up Recommendations Home health PT;Supervision/Assistance - 24 hour (depending on progress/family ability to provide care)    Equipment Recommendations  None recommended by PT    Recommendations for Other Services OT consult     Precautions / Restrictions Precautions Precautions: Fall Restrictions Weight Bearing Restrictions: No      Mobility  Bed Mobility               General bed mobility comments: pt oob in recliner  Transfers Overall transfer level: Needs assistance Equipment used: Rolling walker (2 wheeled) Transfers: Sit to/from Stand Sit to Stand: Min assist         General transfer comment: Assist to stabilize in standing with walker support. VCs safety, hand placement  Ambulation/Gait             General Gait Details: Pt declined ambulation-too fatigued  Stairs            Wheelchair Mobility    Modified Rankin (Stroke Patients Only)       Balance Overall balance assessment: Needs assistance         Standing balance support: Bilateral upper extremity supported;During functional activity Standing balance-Leahy Scale: Poor Standing balance comment: Stood with support of RW for at least 5  mintues for hygiene.                              Pertinent Vitals/Pain Pain Assessment: No/denies pain    Home Living Family/patient expects to be discharged to:: Unsure Living Arrangements: Spouse/significant other Available Help at Discharge: Available 24 hours/day;Family Type of Home: House Home Access: Level entry     Home Layout: One level Home Equipment: Wheelchair - Fluor Corporationmanual;Walker - 2 wheels;Cane - single point      Prior Function Level of Independence: Independent with assistive device(s)               Hand Dominance        Extremity/Trunk Assessment   Upper Extremity Assessment: Defer to OT evaluation           Lower Extremity Assessment: Generalized weakness      Cervical / Trunk Assessment: Kyphotic  Communication   Communication: HOH  Cognition Arousal/Alertness: Awake/alert Behavior During Therapy: WFL for tasks assessed/performed Overall Cognitive Status: Within Functional Limits for tasks assessed                      General Comments      Exercises        Assessment/Plan    PT Assessment Patient needs continued PT services  PT Diagnosis Difficulty walking;Generalized weakness   PT Problem List Decreased strength;Decreased activity tolerance;Decreased balance;Decreased mobility;Decreased knowledge of use of DME  PT Treatment Interventions DME instruction;Gait training;Functional  mobility training;Therapeutic activities;Patient/family education;Balance training;Therapeutic exercise   PT Goals (Current goals can be found in the Care Plan section) Acute Rehab PT Goals Patient Stated Goal: none stated PT Goal Formulation: With patient/family Time For Goal Achievement: 10/05/14 Potential to Achieve Goals: Fair    Frequency Min 3X/week   Barriers to discharge        Co-evaluation               End of Session   Activity Tolerance: Patient limited by fatigue Patient left: in chair;with call bell/phone  within reach;with chair alarm set;with family/visitor present           Time: [id number] PT Time Calculation (min) (ACUTE ONLY): 16 min   Charges:   PT Evaluation $Initial PT Evaluation Tier I: 1 Procedure PT Treatments $Therapeutic Activity: 8-22 mins   PT G Codes:          Rebeca AlertJannie Canio [name], MPT Pager: [phone]

## 2014-09-21 NOTE — Progress Notes (Signed)
INITIAL NUTRITION ASSESSMENT  DOCUMENTATION CODES Per approved criteria  -Severe malnutrition in the context of chronic illness  Pt meets criteria for severe MALNUTRITION in the context of chronic illness as evidenced by severe muscle and fat depletion, energy intake <75% for > 1 month and 27% wt loss x 1 year.  INTERVENTION: -Provide Resource Breeze po TID, each supplement provides 250 kcal and 9 grams of protein -When diet is advanced past clear liquid, provide Ensure Complete po TID, each supplement provides 350 kcal and 13 grams of protein -Encourage PO intake -RD to continue to monitor  NUTRITION DIAGNOSIS: Malnutrition related to cirrhosis with ascites as evidenced by severe muscle and fat depletion and 27% wt loss x 1 year.   Goal: Pt to meet >/= 90% of their estimated nutrition needs   Monitor:  PO and supplemental intake, weight, labs, I/O's  Reason for Assessment: Pt identified as at nutrition risk on the Malnutrition Screen Tool  Admitting Dx:Altered Mental status  ASSESSMENT: 80 year old white male with cryptogenic cirrhosis complicated by varices, ascites, and encephalopathy, ischemic cardiomyopathy (EF 40%) who presented to the emergency department with declining mental status over the past 2-3 days. Patient has had cirrhosis with multiple complications. He is on diuretics for chronic ascites requiring repeated paracentesis (most recently 08/27/14).  Pt was drowsy during visit and hard of hearing. Obtained most of history from wife. Per wife, pt is eating poorly. Pt only eats bites of foods. Pt c/o early satiety. Pt has had significant amounts of weight loss (27% wt loss x 1 year).   Pt mainly c/o pain but does admit to some nausea. Pt is willing to sip on Resource Breeze supplements while on clear liquid diet. Per wife, pt drinks Ensure supplements at home. RD to order TID.  Nutrition Focused Physical Exam:  Subcutaneous Fat:  Orbital Region: moderate  depletion Upper Arm Region: severe depletion Thoracic and Lumbar Region: NA  Muscle:  Temple Region: severe depletion Clavicle Bone Region: severe depletion Clavicle and Acromion Bone Region: severe depletion Scapular Bone Region: severe depletion Dorsal Hand: severe depletion Patellar Region: NA Anterior Thigh Region: NA Posterior Calf Region: severe depletion  Edema: ascites   Labs reviewed: Low K Elevated BUN Glucose 119  Height: Ht Readings from Last 1 Encounters:  09/19/14 5\' 7"  (1.702 m)    Weight: Wt Readings from Last 1 Encounters:  09/21/14 109 lb 9.1 oz (49.7 kg)    Ideal Body Weight: 148 lb  % Ideal Body Weight: 74%  Wt Readings from Last 10 Encounters:  09/21/14 109 lb 9.1 oz (49.7 kg)  08/05/14 120 lb (54.432 kg)  06/30/14 118 lb 8 oz (53.751 kg)  04/01/14 113 lb 8.6 oz (51.5 kg)  12/05/13 126 lb (57.153 kg)  11/24/13 110 lb 7.2 oz (50.1 kg)  11/18/13 127 lb (57.607 kg)  10/08/13 150 lb 9.6 oz (68.312 kg)  09/24/13 150 lb 3.2 oz (68.13 kg)  09/16/13 147 lb 9.6 oz (66.951 kg)    Usual Body Weight: 135 lb -per pt's wife  % Usual Body Weight: 81%  BMI:  Body mass index is 17.16 kg/(m^2).  Estimated Nutritional Needs: Kcal: 1500-1700 Protein: 65-75g Fluid: 1.5L/day  Skin: intact, ecchymosis  Diet Order: Diet clear liquid  EDUCATION NEEDS: -No education needs identified at this time   Intake/Output Summary (Last 24 hours) at 09/21/14 1215 Last data filed at 09/21/14 1045  Gross per 24 hour  Intake   2526 ml  Output    900  ml  Net   1626 ml    Last BM: 11/15  Labs:   Recent Labs Lab 09/19/14 0748 09/20/14 0416 09/21/14 0401  NA 133* 138 140  K 4.8 4.1 3.6*  CL 96 101 107  CO2 26 23 23   BUN 44* 46* 48*  CREATININE 1.87* 1.55* 1.35  CALCIUM 8.5 8.5 8.3*  GLUCOSE 125* 112* 119*    CBG (last 3)  No results for input(s): GLUCAP in the last 72 hours.  Scheduled Meds: . ceFEPime (MAXIPIME) IV  1 g Intravenous Q24H  .  lactulose  10 g Oral BID  . metoprolol  5 mg Intravenous 4 times per day  . metronidazole  500 mg Intravenous Q8H  . pantoprazole (PROTONIX) IV  40 mg Intravenous Q24H  . rifaximin  550 mg Oral BID    Continuous Infusions: . sodium chloride 50 mL/hr at 09/21/14 0900    Past Medical History  Diagnosis Date  . GERD (gastroesophageal reflux disease)   . Diverticulosis     s/p partial colon resection  . CAD (coronary artery disease)     s/p MI 1999;  s/p CABG 2005 (L-LAD, S-Dx, S-OM1, S-PDA); patient had post op AFib;  Myoview 8/10: mild fixed lateral defect; no ischemia, EF 47%  . Hyperlipemia   . Ischemic cardiomyopathy     echo 1/11: mild MR, mild LAE, EF 40-45%;  echo 10/12: EF 40-45%, IL AK, Post AK, grade 2 diast dysfxn, mild to mod MR, mild BAE, PASP 38;   Echo 9/14:  Mild focal basal septal hypertrophy, EF 40-45%, IL, inf and post HK, Gr 1 DD, MAC, mild MR, mild LAE, mild RVE, mild RAE, PASP 32, L pleural effusion  . Hypertension   . Arthritis   . Common bile duct dilatation   . Pancreatitis     post-ERCP 11/2009  . Colitis, ischemic   . Chronic systolic heart failure     Echo 09/05/11: mild focal basal septal hypertrophy, EF 40-45%, basal to mid IL AK, post AK, grade 2 diast dysfxn, mild to mod MR, mild BAE, PASP 38  . Atrial fibrillation     post CABG in 2005 and in setting of acute illness in 1/11 (post ERCP pancreatitis) - coumadin not felt to be indicated  . Lumbar spondylosis   . Elevated LFTs     statin d/c'd in past due to   . BPH (benign prostatic hyperplasia)   . Sinus bradycardia     on beta blockers  . Ulcer 10/2012    in pylorus  . Allergy   . Anemia   . Diverticulitis     with partial colectomy  . Cirrhosis     Negative workup for viral hepatitis, autoimmune hepatitis, hemochromatosis. Never heavy ETOH. Suspect NASH. EGD (12/13) with esophageal varices and portal hypertensive gastropathy. EGD (3/14) with 3 small esophageal varices, portal hypertensive  gastropathy.  . Myocardial infarction 1991; 1999  . Esophageal varices in cirrhosis     Hattie Perch 08/04/2013 (08/05/2013)  . Depression   . Anxiety   . Hyperkalemia requiring hospitalization 08/04/2013  . Cirrhosis   . Herpes zoster 03/27/2014    Past Surgical History  Procedure Laterality Date  . Colon resection      "removed 12-15' of colon" (08/05/2013)  . Back surgery    . Ercp  11/13/2009    dilated bile ducts, atypical cytology, repeat attempt normal pancreatogram but unsuccessful biliary cannulation  . Angioplasty  1999  . Shoulder open rotator cuff repair  Bilateral     "I've had a total of 3 OR's for this" (08/05/2013)  . Cataract extraction w/ intraocular lens implant Right   . Esophagogastroduodenoscopy    . Flexible sigmoidoscopy    . Colonoscopy    . Tonsillectomy    . Coronary angioplasty with stent placement      "before the bypass" (08/05/2013)  . Coronary artery bypass graft  2005    "CABG X7" (08/05/2013)x 4  . Lumbar disc surgery      Tilda FrancoLindsey Margueritte Guthridge, MS, RD, LDN Pager: [phone] After Hours Pager: [phone]

## 2014-09-21 NOTE — Progress Notes (Signed)
While sitting in chair, patient had 9 beats of v-tach. Patient experienced no chest pain or shortness of breath. BP 103/70, HR now in the 60s. Dr. Jacky KindleAronson made aware, magnesium added on to labs for the AM. Will continue to monitor patient.

## 2014-09-21 NOTE — Progress Notes (Signed)
Subjective: Awake, conversant, knows me. No vomiting  Objective: Vital signs in last 24 hours: Temp:  [97.7 F (36.5 C)-99 F (37.2 C)] 97.7 F (36.5 C) (11/16 0510) Pulse Rate:  [75-116] 116 (11/16 0510) Resp:  [18-21] 18 (11/16 0510) BP: (96-116)/(38-63) 109/43 mmHg (11/16 0510) SpO2:  [93 %-99 %] 99 % (11/16 0510) Weight:  [49.7 kg (109 lb 9.1 oz)] 49.7 kg (109 lb 9.1 oz) (11/16 0510) Weight change: 0.4 kg (14.1 oz)  CBG (last 3)  No results for input(s): GLUCAP in the last 72 hours.  Intake/Output from previous day: 11/15 0701 - 11/16 0700 In: 2150 [I.V.:1800; IV Piggyback:350] Out: 900 [Urine:900]  Physical Exam: Alert, conversant, no distress No jvd, no bruit Chest clear bilaterally RRR- distant, no murmur Abdomen- mild distension, soft, nontender, good bowel sounds No edema, good pulses Weak, alert, MOEx4   Lab Results:  Recent Labs  09/20/14 0416 09/21/14 0401  NA 138 140  K 4.1 3.6*  CL 101 107  CO2 23 23  GLUCOSE 112* 119*  BUN 46* 48*  CREATININE 1.55* 1.35  CALCIUM 8.5 8.3*    Recent Labs  09/20/14 0416 09/21/14 0401  AST 34 26  ALT 14 13  ALKPHOS 149* 111  BILITOT 1.1 0.6  PROT 7.4 6.6  ALBUMIN 2.0* 1.7*    Recent Labs  09/19/14 0748 09/20/14 0416 09/21/14 0401  WBC 11.5* 10.4 14.7*  NEUTROABS 9.5*  --   --   HGB 9.1* 8.9* 8.2*  HCT 26.0* 26.2* 24.5*  MCV 89.7 88.2 92.1  PLT 161 201 126*   Lab Results  Component Value Date   INR 1.39 09/19/2014   INR 1.32 06/25/2014   INR 1.30 11/22/2013   No results for input(s): CKTOTAL, CKMB, CKMBINDEX, TROPONINI in the last 72 hours. No results for input(s): TSH, T4TOTAL, T3FREE, THYROIDAB in the last 72 hours.  Invalid input(s): FREET3 No results for input(s): VITAMINB12, FOLATE, FERRITIN, TIBC, IRON, RETICCTPCT in the last 72 hours.  Studies/Results: Ct Abdomen Pelvis Wo Contrast  09/19/2014   CLINICAL DATA:  Abdominal pain weakness, dehydration not eating for several days,  liver failure  EXAM: CT ABDOMEN AND PELVIS WITHOUT CONTRAST  TECHNIQUE: Multidetector CT imaging of the abdomen and pelvis was performed following the standard protocol without IV contrast.  COMPARISON:  08/06/2012.  FINDINGS: Lung bases shows bilateral basilar atelectasis. Sagittal images of the spine shows diffuse osteopenia. Degenerative changes are noted thoracolumbar spine. Small left pleural effusion is noted. Mild distended gallbladder without calcified gallstones.  There is moderate to significant abdominal ascites. Markedly atrophic liver with nodular consistent with cirrhosis. The spleen measures 9.8 cm in length. Prominent size portal vein measures 1.6 cm in diameter consistent with portal hypertension. The stomach is empty collapsed.  Mild distended small bowel loops are noted in right lower quadrant. Distended terminal small bowel loops are noted with fecal like material. Findings are consistent with significant ileus or early bowel obstruction. There is a thickened wall left inguinal hernia containing ascites. The hernia is mild distended measures about 5.2 cm. No bowel loops are identified within hernia. There is a distended rectum with significant stool measures 8 cm in diameter. Findings highly suspicious for fecal impaction. Small pelvic ascites is noted. Some stool noted in transverse colon. Extensive atherosclerotic calcifications of abdominal aorta, SMA and iliac arteries. No aortic aneurysm. Splenic artery calcifications. There is thickening of right colonic wall as seen in axial image 56 probable post cirrhosis and portal hypertension  Unenhanced kidneys are  symmetrical in size. Nonobstructive calcification in upper pole of the left kidney measures 2.5 mm. No hydronephrosis or hydroureter.  IMPRESSION: 1. There is cirrhotic atrophic liver. Large abdominal ascites. Prominent size portal vein measures 1.6 cm in diameter. Findings are consistent with cirrhosis with portal hypertension. Thickening  of right colonic wall probable secondary to cirrhosis. 2. There are distended small bowel loops in right lower quadrant. Fecal like material noted in terminal small bowel loops. Findings are consistent with significant ileus or early bowel obstruction. 3. There is a thickened wall mild distended right inguinal hernia containing ascites. No bowel loops are noted within hernia. 4. Significant distension of the rectum with stool measures 8 x 7.2 cm in diameter consistent with fecal impaction. 5. Left nonobstructive nephrolithiasis. No hydronephrosis or hydroureter.   Electronically Signed   By: Natasha MeadLiviu  Pop M.D.   On: 09/19/2014 11:28   Dg Chest 2 View  09/20/2014   CLINICAL DATA:  80 year old male with aspiration  EXAM: CHEST  2 VIEW  COMPARISON:  CT abdomen/ pelvis 09/19/2014; most recent prior acute abdominal series including chest x-ray 09/09/2014  FINDINGS: Stable cardiac and mediastinal contours. Patient is status post median sternotomy. Rightward rotated position on today's exam. New patchy airspace opacity in the posterior right lower lobe. There may be a small right pleural effusion. The left lung remains relatively clear. No pneumothorax. No acute osseous abnormality.  IMPRESSION: Patchy airspace opacity in the right lower lobe concerning for pneumonia or pneumonitis. Aspiration is a possibility given the clinical history.  Probable small right pleural effusion.   Electronically Signed   By: Malachy MoanHeath  McCullough M.D.   On: 09/20/2014 09:22   Dg Abd 1 View  09/20/2014   CLINICAL DATA:  Small bowel obstruction  EXAM: ABDOMEN - 1 VIEW  COMPARISON:  CT scan 09/19/2014  FINDINGS: There are gaseous distended small bowel loops throughout the abdomen consistent with ileus or partial small bowel obstruction. Moderate stool within rectum measures 6.2 cm in diameter suspicious for mild fecal impaction.  IMPRESSION: Gaseous distended small bowel loops within abdomen consistent with ileus or partial small bowel  obstruction.   Electronically Signed   By: Natasha MeadLiviu  Pop M.D.   On: 09/20/2014 12:40     Assessment/Plan: 1. Hepatic encephalopathy- resolving, restart oral lactulose 2. Aspiration pneumonia- stable, maxipine 3. Afib/rvr-converted nsr 4. SBO- clinically improved, adbvance diet 5. Acute renal failure- improved with hydration 6. Hepatic cirrhosis with portal hypertension and ascites 7. Protein calorie malnutrition 6. Chronic CHF- systolic and diastolic   LOS: 2 days   Farhana Fellows A 09/21/2014, 8:10 AM

## 2014-09-21 NOTE — Plan of Care (Signed)
Problem: Phase I Progression Outcomes Goal: Pain controlled with appropriate interventions Outcome: Completed/Met Date Met:  09/21/14     

## 2014-09-22 ENCOUNTER — Inpatient Hospital Stay (HOSPITAL_COMMUNITY): Payer: Medicare Other

## 2014-09-22 LAB — BODY FLUID CELL COUNT WITH DIFFERENTIAL
Lymphs, Fluid: 21 %
Monocyte-Macrophage-Serous Fluid: 73 % (ref 50–90)
NEUTROPHIL FLUID: 6 % (ref 0–25)
WBC FLUID: 75 uL (ref 0–1000)

## 2014-09-22 LAB — CBC
HCT: 24.6 % — ABNORMAL LOW (ref 39.0–52.0)
HEMOGLOBIN: 8.1 g/dL — AB (ref 13.0–17.0)
MCH: 29.6 pg (ref 26.0–34.0)
MCHC: 32.9 g/dL (ref 30.0–36.0)
MCV: 89.8 fL (ref 78.0–100.0)
Platelets: 125 10*3/uL — ABNORMAL LOW (ref 150–400)
RBC: 2.74 MIL/uL — ABNORMAL LOW (ref 4.22–5.81)
RDW: 16.1 % — ABNORMAL HIGH (ref 11.5–15.5)
WBC: 11 10*3/uL — ABNORMAL HIGH (ref 4.0–10.5)

## 2014-09-22 LAB — COMPREHENSIVE METABOLIC PANEL
ALT: 14 U/L (ref 0–53)
AST: 33 U/L (ref 0–37)
Albumin: 1.6 g/dL — ABNORMAL LOW (ref 3.5–5.2)
Alkaline Phosphatase: 157 U/L — ABNORMAL HIGH (ref 39–117)
Anion gap: 10 (ref 5–15)
BUN: 41 mg/dL — ABNORMAL HIGH (ref 6–23)
CO2: 24 meq/L (ref 19–32)
Calcium: 8.4 mg/dL (ref 8.4–10.5)
Chloride: 109 mEq/L (ref 96–112)
Creatinine, Ser: 1.13 mg/dL (ref 0.50–1.35)
GFR calc Af Amer: 66 mL/min — ABNORMAL LOW (ref >90)
GFR calc non Af Amer: 57 mL/min — ABNORMAL LOW (ref >90)
Glucose, Bld: 122 mg/dL — ABNORMAL HIGH (ref 70–99)
POTASSIUM: 3.4 meq/L — AB (ref 3.7–5.3)
SODIUM: 143 meq/L (ref 137–147)
TOTAL PROTEIN: 6.6 g/dL (ref 6.0–8.3)
Total Bilirubin: 0.8 mg/dL (ref 0.3–1.2)

## 2014-09-22 LAB — MAGNESIUM: Magnesium: 2.3 mg/dL (ref 1.5–2.5)

## 2014-09-22 LAB — AMMONIA: Ammonia: 63 umol/L — ABNORMAL HIGH (ref 11–60)

## 2014-09-22 MED ORDER — DEXTROSE 5 % IV SOLN
1.0000 g | Freq: Two times a day (BID) | INTRAVENOUS | Status: DC
  Administered 2014-09-22 – 2014-09-24 (×4): 1 g via INTRAVENOUS
  Filled 2014-09-22 (×5): qty 1

## 2014-09-22 MED ORDER — METOPROLOL TARTRATE 1 MG/ML IV SOLN
2.5000 mg | Freq: Three times a day (TID) | INTRAVENOUS | Status: DC
  Administered 2014-09-22 – 2014-09-23 (×2): 2.5 mg via INTRAVENOUS
  Filled 2014-09-22 (×3): qty 5

## 2014-09-22 NOTE — Progress Notes (Signed)
ANTIBIOTIC CONSULT NOTE - follow-up  Pharmacy Consult for cefepime Indication: HCAP with possible aspiration  Allergies  Allergen Reactions  . Erythromycin Itching and Rash    Patient allergic to all Mycins   . Penicillins Rash    Patient Measurements: Height: 5\' 7"  (170.2 cm) Weight: 109 lb 9.1 oz (49.7 kg) IBW/kg (Calculated) : 66.1  Vital Signs: Temp: 97.5 F (36.4 C) (11/17 0552) Temp Source: Oral (11/17 0552) BP: 104/77 mmHg (11/17 0552) Pulse Rate: 90 (11/17 0552) Intake/Output from previous day: 11/16 0701 - 11/17 0700 In: 2207 [P.O.:832; I.V.:1125; IV Piggyback:250] Out: 950 [Urine:950] Intake/Output from this shift:    Labs:  Recent Labs  09/20/14 0416 09/21/14 0401 09/22/14 0532  WBC 10.4 14.7* 11.0*  HGB 8.9* 8.2* 8.1*  PLT 201 126* 125*  CREATININE 1.55* 1.35 1.13   Estimated Creatinine Clearance: 33.6 mL/min (by C-G formula based on Cr of 1.13). No results for input(s): VANCOTROUGH, VANCOPEAK, VANCORANDOM, GENTTROUGH, GENTPEAK, GENTRANDOM, TOBRATROUGH, TOBRAPEAK, TOBRARND, AMIKACINPEAK, AMIKACINTROU, AMIKACIN in the last 72 hours.   Microbiology: No results found for this or any previous visit (from the past 720 hour(s)).   Assessment: 80 yo with cryptogenic cirrhosis with multiple complications secondary to that presented to ER on 11/14 with declining mental status over past 2-3 days. Patient now with possible HCAP along with aspiration to start Flagyl per Md dosing and cefepime per pharmacy dosing.  11/15 >> Flagyl >> 11/15 >> cefepime >>   Tmax: afebrile WBCs: elevated, improved from 11/16 Renal: Scr 1.13, improved. CrCl 34 ml/min  No cultures  Goal of Therapy:  treatment of PNA  Plan:  Day #3 Cefepime  With improved renal function, change Cefepime 1g IV q12h   Juliette Alcideustin Zeigler, PharmD, BCPS.   Pager: [phone] 09/22/2014 12:39 PM

## 2014-09-22 NOTE — Procedures (Signed)
US guided diagnostic/therapeutic paracentesis performed yielding 3.8 liters clear ,yellow fluid. A portion of the fluid was sent to the lab for preordered studies. No immediate complications.

## 2014-09-22 NOTE — Plan of Care (Signed)
Problem: Phase I Progression Outcomes Goal: Hemodynamically stable Outcome: Progressing     

## 2014-09-22 NOTE — Evaluation (Addendum)
Clinical/Bedside Swallow Evaluation Patient Details  Name: Kirk [name] MRN: [id number] Date of Birth: 11/04/1927  Today's Date: 09/22/2014 Time: 1209-1240 SLP Time Calculation (min) (ACUTE ONLY): 31 min  Past Medical History:  Past Medical History  Diagnosis Date  . GERD (gastroesophageal reflux disease)   . Diverticulosis     s/p partial colon resection  . CAD (coronary artery disease)     s/p MI 1999;  s/p CABG 2005 (L-LAD, S-Dx, S-OM1, S-PDA); patient had post op AFib;  Myoview 8/10: mild fixed lateral defect; no ischemia, EF 47%  . Hyperlipemia   . Ischemic cardiomyopathy     echo 1/11: mild MR, mild LAE, EF 40-45%;  echo 10/12: EF 40-45%, IL AK, Post AK, grade 2 diast dysfxn, mild to mod MR, mild BAE, PASP 38;   Echo 9/14:  Mild focal basal septal hypertrophy, EF 40-45%, IL, inf and post HK, Gr 1 DD, MAC, mild MR, mild LAE, mild RVE, mild RAE, PASP 32, L pleural effusion  . Hypertension   . Arthritis   . Common bile duct dilatation   . Pancreatitis     post-ERCP 11/2009  . Colitis, ischemic   . Chronic systolic heart failure     Echo 09/05/11: mild focal basal septal hypertrophy, EF 40-45%, basal to mid IL AK, post AK, grade 2 diast dysfxn, mild to mod MR, mild BAE, PASP 38  . Atrial fibrillation     post CABG in 2005 and in setting of acute illness in 1/11 (post ERCP pancreatitis) - coumadin not felt to be indicated  . Lumbar spondylosis   . Elevated LFTs     statin d/c'd in past due to   . BPH (benign prostatic hyperplasia)   . Sinus bradycardia     on beta blockers  . Ulcer 10/2012    in pylorus  . Allergy   . Anemia   . Diverticulitis     with partial colectomy  . Cirrhosis     Negative workup for viral hepatitis, autoimmune hepatitis, hemochromatosis. Never heavy ETOH. Suspect NASH. EGD (12/13) with esophageal varices and portal hypertensive gastropathy. EGD (3/14) with 3 small esophageal varices, portal hypertensive gastropathy.  . Myocardial infarction 1991;  1999  . Esophageal varices in cirrhosis     Hattie Perch/notes 08/04/2013 (08/05/2013)  . Depression   . Anxiety   . Hyperkalemia requiring hospitalization 08/04/2013  . Cirrhosis   . Herpes zoster 03/27/2014   Past Surgical History:  Past Surgical History  Procedure Laterality Date  . Colon resection      "removed 12-15' of colon" (08/05/2013)  . Back surgery    . Ercp  11/13/2009    dilated bile ducts, atypical cytology, repeat attempt normal pancreatogram but unsuccessful biliary cannulation  . Angioplasty  1999  . Shoulder open rotator cuff repair Bilateral     "I've had a total of 3 OR's for this" (08/05/2013)  . Cataract extraction w/ intraocular lens implant Right   . Esophagogastroduodenoscopy    . Flexible sigmoidoscopy    . Colonoscopy    . Tonsillectomy    . Coronary angioplasty with stent placement      "before the bypass" (08/05/2013)  . Coronary artery bypass graft  2005    "CABG X7" (08/05/2013)x 4  . Lumbar disc surgery     HPI:  80 yo male adm [facility] diagnosed with hepatic encephalopathy.  PMH with ascities, cirrhosis, esophageal varisces, etc.  Concern for possible asp pna per CXR noted and MD ordered  BSE.  Per imaging study, pt with ? SBO or ? ileus and per RN pt has a bowel movement yesterday.     Assessment / Plan / Recommendation Clinical Impression  Suspect multifactorial dysphagia with cognitive component from encephalopathy with resultant mild delay in oral transiting/decreased oral control.  Pt apparently more alert today which will assist with airway protection.   Suspect primary aspiration risk is from known esophageal deficits, GERD, etc.  Frequent belching noted during intake and pt admits to occasional sensation of backflow into throat producing coughing.  Pt also admits to recent issues with vomiting and he may have aspirated from this event.  Did not test pt with solids given pt on clear diet and ? SBO.    Recommend advance diet as tolerated with strict  precautions.  SLP to follow up x1 for family education and to determine if further testing indicated.  Educated pt to importance of aspiration precautions using teach back for reinforcement.    Concern present for pt intake given weight loss and reported early satiety per RD note.  Pt did enjoy Resource breeze provided during our session.      Aspiration Risk  Mild (if strict precautions not followed )    Diet Recommendation Thin liquid (solids as tolerated)   Liquid Administration via: Cup;Straw Medication Administration:  (as tolerated) Supervision: Patient able to self feed;Intermittent supervision to cue for compensatory strategies Compensations: Slow rate;Small sips/bites Postural Changes and/or Swallow Maneuvers: Seated upright 90 degrees;Upright 30-60 min after meal    Other  Recommendations Oral Care Recommendations: Oral care BID   Follow Up Recommendations    TBD   Frequency and Duration min 1 x/week  1 week   Pertinent Vitals/Pain Afebrile, decreased     Swallow Study Prior Functional Status   see HHX    General Date of Onset: 09/22/14 HPI: 80 [facility] with hepatic encephalopathy.  PMH with ascities, cirrhosis, esophageal varisces, etc.  Concern for possible asp pna per CXR noted and MD ordered BSE.  Per imaging study, pt with ? SBO or ? ileus and per RN pt has a bowel movement yesterday.   Type of Study: Bedside swallow evaluation Diet Prior to this Study: Thin liquids (clears) Temperature Spikes Noted: No Respiratory Status: Room air History of Recent Intubation: No Behavior/Cognition: Alert;Cooperative;Other (comment);Decreased sustained attention;Hard of hearing (pt became sleepy quickly during meal) Oral Cavity - Dentition: Adequate natural dentition Self-Feeding Abilities: Able to feed self Patient Positioning: Upright in chair Baseline Vocal Quality: Clear Volitional Cough: Strong Volitional Swallow: Unable to elicit     Oral/Motor/Sensory Function Overall Oral Motor/Sensory Function: Appears within functional limits for tasks assessed (generalized weakness)   Ice Chips Ice chips: Not tested   Thin Liquid Thin Liquid: Within functional limits Presentation: Cup;Self Fed;Straw Other Comments: cough x1 noted - while pt had jello retained in oral cavity    Nectar Thick Nectar Thick Liquid: Not tested   Honey Thick Honey Thick Liquid: Not tested   Puree Presentation: Self Fed;Spoon Other Comments: JELLO    Solid   GO    Solid: Not tested Other Comments: pt on clear diet       Mills KollerKimball, Raylon Lamson Ann Carly Sabo, MS [facility] SLP [phone]

## 2014-09-22 NOTE — Plan of Care (Signed)
Problem: Phase I Progression Outcomes Goal: Hemodynamically stable Outcome: Completed/Met Date Met:  09/22/14 Goal: Other Phase I Outcomes/Goals Outcome: Completed/Met Date Met:  09/22/14

## 2014-09-22 NOTE — Progress Notes (Signed)
Subjective: Up in chair, more comfortable, alert  Objective: Vital signs in last 24 hours: Temp:  [97.5 F (36.4 C)-97.7 F (36.5 C)] 97.5 F (36.4 C) (11/17 0552) Pulse Rate:  [65-102] 90 (11/17 0552) Resp:  [18] 18 (11/17 0552) BP: (99-117)/(52-77) 104/77 mmHg (11/17 0552) SpO2:  [94 %-97 %] 94 % (11/17 0552) Weight change:   CBG (last 3)  No results for input(s): GLUCAP in the last 72 hours.  Intake/Output from previous day: 11/16 0701 - 11/17 0700 In: 2207 [P.O.:832; I.V.:1125; IV Piggyback:250] Out: 950 [Urine:950]  Physical Exam:  Alert, conversant, no distress No jvd, no bruit Chest clear bilaterally- few scattered rhonchi RRR- distant, no murmur Abdomen- , nontender, good bowel sounds- increased distension, tense No edema, good pulses Weak, alert, MOEx4  Lab Results:  Recent Labs  09/21/14 0401 09/22/14 0532  NA 140 143  K 3.6* 3.4*  CL 107 109  CO2 23 24  GLUCOSE 119* 122*  BUN 48* 41*  CREATININE 1.35 1.13  CALCIUM 8.3* 8.4  MG  --  2.3    Recent Labs  09/21/14 0401 09/22/14 0532  AST 26 33  ALT 13 14  ALKPHOS 111 157*  BILITOT 0.6 0.8  PROT 6.6 6.6  ALBUMIN 1.7* 1.6*    Recent Labs  09/21/14 0401 09/22/14 0532  WBC 14.7* 11.0*  HGB 8.2* 8.1*  HCT 24.5* 24.6*  MCV 92.1 89.8  PLT 126* 125*   Lab Results  Component Value Date   INR 1.39 09/19/2014   INR 1.32 06/25/2014   INR 1.30 11/22/2013   No results for input(s): CKTOTAL, CKMB, CKMBINDEX, TROPONINI in the last 72 hours. No results for input(s): TSH, T4TOTAL, T3FREE, THYROIDAB in the last 72 hours.  Invalid input(s): FREET3 No results for input(s): VITAMINB12, FOLATE, FERRITIN, TIBC, IRON, RETICCTPCT in the last 72 hours.  Studies/Results: No results found.   Assessment/Plan:  1. Hepatic encephalopathy- resolving, restart oral lactulose 2. Aspiration pneumonia- stable, maxipine 3. Afib/rvr-converted nsr 4. SBO- clinically improved, adbvance diet 5. Acute renal  failure- improved with hydration 6. Hepatic cirrhosis with portal hypertension and ascites- paracentesis 7. Protein calorie malnutrition 6. Chronic CHF- systolic and diastolic  LOS: 3 days   Yandiel Bergum A 09/22/2014, 11:05 AM

## 2014-09-22 NOTE — Plan of Care (Signed)
Problem: Phase I Progression Outcomes Goal: OOB as tolerated unless otherwise ordered Outcome: Completed/Met Date Met:  09/22/14     

## 2014-09-23 DIAGNOSIS — K7290 Hepatic failure, unspecified without coma: Principal | ICD-10-CM

## 2014-09-23 DIAGNOSIS — K7469 Other cirrhosis of liver: Secondary | ICD-10-CM

## 2014-09-23 DIAGNOSIS — R188 Other ascites: Secondary | ICD-10-CM

## 2014-09-23 LAB — COMPREHENSIVE METABOLIC PANEL
ALT: 22 U/L (ref 0–53)
ANION GAP: 8 (ref 5–15)
AST: 63 U/L — ABNORMAL HIGH (ref 0–37)
Albumin: 1.6 g/dL — ABNORMAL LOW (ref 3.5–5.2)
Alkaline Phosphatase: 297 U/L — ABNORMAL HIGH (ref 39–117)
BUN: 31 mg/dL — AB (ref 6–23)
CALCIUM: 8.1 mg/dL — AB (ref 8.4–10.5)
CO2: 23 mEq/L (ref 19–32)
Chloride: 111 mEq/L (ref 96–112)
Creatinine, Ser: 1.05 mg/dL (ref 0.50–1.35)
GFR calc non Af Amer: 63 mL/min — ABNORMAL LOW (ref >90)
GFR, EST AFRICAN AMERICAN: 73 mL/min — AB (ref >90)
Glucose, Bld: 119 mg/dL — ABNORMAL HIGH (ref 70–99)
Potassium: 3.7 mEq/L (ref 3.7–5.3)
SODIUM: 142 meq/L (ref 137–147)
TOTAL PROTEIN: 6.4 g/dL (ref 6.0–8.3)
Total Bilirubin: 1.2 mg/dL (ref 0.3–1.2)

## 2014-09-23 LAB — CBC
HCT: 25.6 % — ABNORMAL LOW (ref 39.0–52.0)
HEMOGLOBIN: 8.5 g/dL — AB (ref 13.0–17.0)
MCH: 30.4 pg (ref 26.0–34.0)
MCHC: 33.2 g/dL (ref 30.0–36.0)
MCV: 91.4 fL (ref 78.0–100.0)
Platelets: 124 10*3/uL — ABNORMAL LOW (ref 150–400)
RBC: 2.8 MIL/uL — AB (ref 4.22–5.81)
RDW: 15.8 % — ABNORMAL HIGH (ref 11.5–15.5)
WBC: 11.3 10*3/uL — AB (ref 4.0–10.5)

## 2014-09-23 LAB — AMMONIA: AMMONIA: 79 umol/L — AB (ref 11–60)

## 2014-09-23 LAB — PATHOLOGIST SMEAR REVIEW

## 2014-09-23 MED ORDER — SPIRONOLACTONE 50 MG PO TABS
50.0000 mg | ORAL_TABLET | Freq: Two times a day (BID) | ORAL | Status: DC
  Administered 2014-09-23 – 2014-09-24 (×2): 50 mg via ORAL
  Filled 2014-09-23 (×4): qty 1

## 2014-09-23 MED ORDER — METOPROLOL SUCCINATE ER 25 MG PO TB24
25.0000 mg | ORAL_TABLET | Freq: Every day | ORAL | Status: DC
  Administered 2014-09-23 – 2014-09-24 (×2): 25 mg via ORAL
  Filled 2014-09-23 (×3): qty 1

## 2014-09-23 MED ORDER — SODIUM CHLORIDE 0.9 % IV SOLN: INTRAVENOUS | Status: DC

## 2014-09-23 MED ORDER — PANTOPRAZOLE SODIUM 40 MG PO TBEC
40.0000 mg | DELAYED_RELEASE_TABLET | Freq: Every day | ORAL | Status: DC
  Administered 2014-09-23 – 2014-09-24 (×2): 40 mg via ORAL
  Filled 2014-09-23 (×3): qty 1

## 2014-09-23 MED ORDER — MAGNESIUM CITRATE PO SOLN
0.5000 | Freq: Once | ORAL | Status: AC
  Administered 2014-09-23: 0.5 via ORAL

## 2014-09-23 MED ORDER — LACTULOSE 10 GM/15ML PO SOLN
20.0000 g | Freq: Three times a day (TID) | ORAL | Status: DC
  Administered 2014-09-23 – 2014-09-24 (×4): 20 g via ORAL
  Filled 2014-09-23 (×9): qty 30

## 2014-09-23 MED ORDER — BISACODYL 10 MG RE SUPP
10.0000 mg | Freq: Two times a day (BID) | RECTAL | Status: DC
  Administered 2014-09-23 (×2): 10 mg via RECTAL
  Filled 2014-09-23 (×4): qty 1

## 2014-09-23 NOTE — Progress Notes (Signed)
Pt had large BM. The BM was a large amount of formed stool followed by a large amount of loose stool. Pt states he feel much better after the BM and is resting comfortably. Will continue to monitor.

## 2014-09-23 NOTE — Plan of Care (Signed)
Problem: Phase II Progression Outcomes Goal: Vital signs remain stable Outcome: Completed/Met Date Met:  09/23/14 Goal: Obtain order to discontinue catheter if appropriate Outcome: Not Applicable Date Met:  44/81/85

## 2014-09-23 NOTE — Progress Notes (Signed)
Subjective: [name] bit drowsy, will converse  Objective: Vital signs in last 24 hours: Temp:  [97.7 F (36.5 C)-98.1 F (36.7 C)] 98.1 F (36.7 C) (11/18 0616) Pulse Rate:  [52-115] 115 (11/18 0616) Resp:  [18-20] 18 (11/18 0616) BP: (96-115)/(40-60) 113/58 mmHg (11/18 0616) SpO2:  [95 %-98 %] 98 % (11/18 0616) Weight:  [48.2 kg (106 lb 4.2 oz)] 48.2 kg (106 lb 4.2 oz) (11/18 0616) Weight change:   CBG (last 3)  No results for input(s): GLUCAP in the last 72 hours.  Intake/Output from previous day: 11/17 0701 - 11/18 0700 In: 2201 [P.O.:601; I.V.:1250; IV Piggyback:350] Out: 750 [Urine:750]  Physical Exam: In bed, drowsy No jvd Chest clear Hs distant-regular Abdomen much softer, nontender Neuro-somnolent, converses, moex4   Lab Results:  Recent Labs  09/22/14 0532 09/23/14 0420  NA 143 142  K 3.4* 3.7  CL 109 111  CO2 24 23  GLUCOSE 122* 119*  BUN 41* 31*  CREATININE 1.13 1.05  CALCIUM 8.4 8.1*  MG 2.3  --     Recent Labs  09/22/14 0532 09/23/14 0420  AST 33 63*  ALT 14 22  ALKPHOS 157* 297*  BILITOT 0.8 1.2  PROT 6.6 6.4  ALBUMIN 1.6* 1.6*    Recent Labs  09/22/14 0532 09/23/14 0420  WBC 11.0* 11.3*  HGB 8.1* 8.5*  HCT 24.6* 25.6*  MCV 89.8 91.4  PLT 125* 124*   Lab Results  Component Value Date   INR 1.39 09/19/2014   INR 1.32 06/25/2014   INR 1.30 11/22/2013   No results for input(s): CKTOTAL, CKMB, CKMBINDEX, TROPONINI in the last 72 hours. No results for input(s): TSH, T4TOTAL, T3FREE, THYROIDAB in the last 72 hours.  Invalid input(s): FREET3 No results for input(s): VITAMINB12, FOLATE, FERRITIN, TIBC, IRON, RETICCTPCT in the last 72 hours.  Studies/Results: [place] Paracentesis  09/22/2014   INDICATION: Cirrhosis, recurrent ascites. Request is made for diagnostic and therapeutic paracentesis  EXAM: ULTRASOUND-GUIDED DIAGNOSTIC AND THERAPEUTIC PARACENTESIS  COMPARISON:  PRIOR PARACENTESIS ON 08/27/2014  MEDICATIONS: None.   COMPLICATIONS: None immediate  TECHNIQUE: Informed written consent was obtained from the patient after [name] discussion of the risks, benefits and alternatives to treatment. [name] timeout was performed prior to the initiation of the procedure.  Initial ultrasound scanning demonstrates [name] moderate amount of ascites within the right mid to lower abdominal quadrant. The right mid to lower abdomen was prepped and draped in the usual sterile fashion. 1% lidocaine was used for local anesthesia. Under direct ultrasound guidance, [name] 19 gauge, 7-cm, Yueh catheter was introduced. An ultrasound image was saved for documentation purposed. The paracentesis was performed. The catheter was removed and [name] dressing was applied. The patient tolerated the procedure well without immediate post procedural complication.  FINDINGS: [name] total of approximately 3.8 liters of clear, yellow fluid was removed. Samples were sent to the laboratory as requested by the clinical team.  IMPRESSION: Successful ultrasound-guided diagnostic and therapeutic paracentesis yielding 3.8 liters of peritoneal fluid.  Read by: Jeananne RamaKevin Allred, PA-C   Electronically Signed   By: Irish LackGlenn  Yamagata M.D.   On: 09/22/2014 16:24     Assessment/Plan: 1. Hepatic encephalopathy- resolving, [name] bit more sonolent-follow 2. Aspiration pneumonia- stable, maxipine 3. Afib/rvr-converted nsr- change to oral metoprolol 4. SBO- clinically improved, adbvance diet 5. Acute renal failure- improved with hydration 6. Hepatic cirrhosis with portal hypertension and ascites- paracentesis 7. Protein calorie malnutrition 6. Chronic CHF- systolic and diastolic**   LOS: 4 days   Kirk [name] 09/23/2014,  10:40 AM   

## 2014-09-23 NOTE — Clinical Documentation Improvement (Signed)
Please specify diagnosis related to below supporting information if appropriate.   Possible Clinical Conditions? _______ Ventricular  Tachycardia  _______Other Condition__________________ _______Cannot Clinically Determine   Supporting Information: Per 11/ / 15 nurse note = While sitting in chair, patient had 9 beats of v-tach. Patient experienced no chest pain or shortness of breath. BP 103/70, HR now in the 60s. Dr. Jacky KindleAronson made aware, magnesium added on to labs for the AM. Will continue to monitor patient.  Per 09/21/14 ED note = cardiovascular: S1 normal, normal heart sounds and normal pulses. An irregularly irregular rhythm present. Tachycardia present.    Patient tachycardic between 100 and 120 on exam.    Thank You, Shelda Palarlene H Jannelly Bergren ,RN Clinical Documentation Specialist:  [phone]  [facility]- Health Information Management

## 2014-09-23 NOTE — Consult Note (Signed)
Consultation  Referring Provider:   Geoffry Paradiseichard Aronson, MD   Primary Care Physician:  Minda MeoARONSON,RICHARD A, MD Primary Gastroenterologist:   Stan Headarl Gessner, MD      Reason for Consultation:  cirrhosis            HPI:   Kirk [name] is a 80 y.o. male with multiple medical problems known to us for a history of cryptogenic cirrhosis complicated by portal HTN. His health has been declining over last few months. His ascites has been difficult to manage. Diuresis tends to cause hyponatremia.   Patient admitted 4 days ago with hepatic encephalopathy / AKI and SBO. Patient had been vomiting so not sure he was absorbing lactulose. This admission he had 3.8 liters ascites removed. No evidence for SBP. Home lasix and aldactone are currently on hold, renal functioning normalizing.  Patient is being treated for PNA, possibly aspiration related. Difficult to obtain history. Patient confused.   Past Medical History  Diagnosis Date  . GERD (gastroesophageal reflux disease)   . Diverticulosis     s/p partial colon resection  . CAD (coronary artery disease)     s/p MI 1999;  s/p CABG 2005 (L-LAD, S-Dx, S-OM1, S-PDA); patient had post op AFib;  Myoview 8/10: mild fixed lateral defect; no ischemia, EF 47%  . Hyperlipemia   . Ischemic cardiomyopathy     echo 1/11: mild MR, mild LAE, EF 40-45%;  echo 10/12: EF 40-45%, IL AK, Post AK, grade 2 diast dysfxn, mild to mod MR, mild BAE, PASP 38;   Echo 9/14:  Mild focal basal septal hypertrophy, EF 40-45%, IL, inf and post HK, Gr 1 DD, MAC, mild MR, mild LAE, mild RVE, mild RAE, PASP 32, L pleural effusion  . Hypertension   . Arthritis   . Common bile duct dilatation   . Pancreatitis     post-ERCP 11/2009  . Colitis, ischemic   . Chronic systolic heart failure     Echo 09/05/11: mild focal basal septal hypertrophy, EF 40-45%, basal to mid IL AK, post AK, grade 2 diast dysfxn, mild to mod MR, mild BAE, PASP 38  . Atrial fibrillation     post CABG in 2005 and in  setting of acute illness in 1/11 (post ERCP pancreatitis) - coumadin not felt to be indicated  . Lumbar spondylosis   . Elevated LFTs     statin d/c'd in past due to   . BPH (benign prostatic hyperplasia)   . Sinus bradycardia     on beta blockers  . Ulcer 10/2012    in pylorus  . Allergy   . Anemia   . Diverticulitis     with partial colectomy  . Cirrhosis     Negative workup for viral hepatitis, autoimmune hepatitis, hemochromatosis. Never heavy ETOH. Suspect NASH. EGD (12/13) with esophageal varices and portal hypertensive gastropathy. EGD (3/14) with 3 small esophageal varices, portal hypertensive gastropathy.  . Myocardial infarction 1991; 1999  . Esophageal varices in cirrhosis     Hattie Perch/notes 08/04/2013 (08/05/2013)  . Depression   . Anxiety   . Hyperkalemia requiring hospitalization 08/04/2013  . Cirrhosis   . Herpes zoster 03/27/2014    Past Surgical History  Procedure Laterality Date  . Colon resection      "removed 12-15' of colon" (08/05/2013)  . Back surgery    . Ercp  11/13/2009    dilated bile ducts, atypical cytology, repeat attempt normal pancreatogram but unsuccessful biliary cannulation  . Angioplasty  1999  .  Shoulder open rotator cuff repair Bilateral     "I've had a total of 3 OR's for this" (08/05/2013)  . Cataract extraction w/ intraocular lens implant Right   . Esophagogastroduodenoscopy    . Flexible sigmoidoscopy    . Colonoscopy    . Tonsillectomy    . Coronary angioplasty with stent placement      "before the bypass" (08/05/2013)  . Coronary artery bypass graft  2005    "CABG X7" (08/05/2013)x 4  . Lumbar disc surgery      Family History  Problem Relation Age of Onset  . Diabetes Mother   . Heart disease Mother   . Congestive Heart Failure Mother   . Lung cancer Father      History  Substance Use Topics  . Smoking status: Current Every Day Smoker -- 68 years    Types: Cigars  . Smokeless tobacco: Never Used     Comment: 08/05/2013 "probably 3  cigars/day"; form given 10-08-13  . Alcohol Use: No    Prior to Admission medications   Medication Sig Start Date End Date Taking? Authorizing Provider  escitalopram (LEXAPRO) 10 MG tablet Take 5 mg by mouth 2 (two) times daily.    Yes Historical Provider, MD  furosemide (LASIX) 20 MG tablet Take 2 tablets (40 mg total) by mouth daily. 07/20/14  Yes Iva Booparl E Gessner, MD  gabapentin (NEURONTIN) 100 MG capsule Take 100 mg by mouth 2 (two) times daily as needed. For nerve pain   Yes Historical Provider, MD  hydrALAZINE (APRESOLINE) 25 MG tablet Take 25 mg by mouth 2 (two) times daily.   Yes Historical Provider, MD  lactulose (CHRONULAC) 10 GM/15ML solution Take 10 g by mouth daily.   Yes Historical Provider, MD  oxyCODONE (OXY IR/ROXICODONE) 5 MG immediate release tablet Take 5 mg by mouth every 4 (four) hours as needed for moderate pain or severe pain.   Yes Historical Provider, MD  pantoprazole (PROTONIX) 40 MG tablet Take 40 mg by mouth daily.   Yes Historical Provider, MD  rifaximin (XIFAXAN) 550 MG TABS tablet Take 550 mg by mouth 2 (two) times daily.   Yes Historical Provider, MD  spironolactone (ALDACTONE) 50 MG tablet Take 2 tablets (100 mg total) by mouth daily. 07/20/14  Yes Iva Booparl E Gessner, MD  triamcinolone cream (KENALOG) 0.1 % Apply 1 application topically 2 (two) times daily.   Yes Historical Provider, MD  triamcinolone cream (KENALOG) 0.5 % Apply 1 application topically 2 (two) times daily.   Yes Historical Provider, MD  hyoscyamine (LEVSIN SL) 0.125 MG SL tablet Place 0.125 mg under the tongue every 4 (four) hours as needed for cramping.    Historical Provider, MD  lidocaine (LIDODERM) 5 % Place 1 patch onto the skin daily. Remove & Discard patch within 12 hours or as directed by MD 08/05/14   Iva Booparl E Gessner, MD  ondansetron (ZOFRAN) 4 MG tablet Take 4 mg by mouth every 6 (six) hours as needed for nausea or vomiting.    Historical Provider, MD  silver sulfADIAZINE (SILVADENE) 1 % cream  Apply 1 application topically daily.    Historical Provider, MD    Current Facility-Administered Medications  Medication Dose Route Frequency Provider Last Rate Last Dose  . 0.9 %  sodium chloride infusion   Intravenous Continuous Minda Meoichard A Aronson, MD 50 mL/hr at 09/22/14 1745    . acetaminophen (TYLENOL) tablet 650 mg  650 mg Oral Q6H PRN Martha ClanWilliam Shaw, MD  Or  . acetaminophen (TYLENOL) suppository 650 mg  650 mg Rectal Q6H PRN Martha Clan, MD      . alum & mag hydroxide-simeth (MAALOX/MYLANTA) 200-200-20 MG/5ML suspension 30 mL  30 mL Oral Q6H PRN Martha Clan, MD      . ceFEPIme (MAXIPIME) 1 g in dextrose 5 % 50 mL IVPB  1 g Intravenous Q12H Dannielle Huh, RPH   1 g at 09/22/14 2239  . feeding supplement (RESOURCE BREEZE) (RESOURCE BREEZE) liquid 1 Container  1 Container Oral TID BM Tilda Franco, RD   1 Container at 09/22/14 2124  . lactulose (CHRONULAC) 10 GM/15ML solution 10 g  10 g Oral BID Minda Meo, MD   10 g at 09/22/14 2239  . metoprolol (LOPRESSOR) injection 2.5 mg  2.5 mg Intravenous 3 times per day Julian Hy, MD   2.5 mg at 09/23/14 0620  . metroNIDAZOLE (FLAGYL) IVPB 500 mg  500 mg Intravenous Q8H Martha Clan, MD   500 mg at 09/23/14 0840  . morphine 2 MG/ML injection 2 mg  2 mg Intravenous Q4H PRN Martha Clan, MD   2 mg at 09/21/14 0800  . ondansetron (ZOFRAN) injection 4 mg  4 mg Intravenous Q6H PRN Jinger Neighbors, NP   4 mg at 09/19/14 2142  . oxyCODONE (Oxy IR/ROXICODONE) immediate release tablet 5 mg  5 mg Oral Q4H PRN Minda Meo, MD   5 mg at 09/23/14 4098  . pantoprazole (PROTONIX) injection 40 mg  40 mg Intravenous Q24H Martha Clan, MD   40 mg at 09/22/14 1248  . rifaximin (XIFAXAN) tablet 550 mg  550 mg Oral BID Martha Clan, MD   550 mg at 09/22/14 2239    Allergies as of 09/19/2014 - Review Complete 09/19/2014  Allergen Reaction Noted  . Erythromycin Itching and Rash 03/16/2011  . Penicillins Rash 03/16/2011   CTscan  09/19/14 IMPRESSION: 1. There is cirrhotic atrophic liver. Large abdominal ascites. Prominent size portal vein measures 1.6 cm in diameter. Findings are consistent with cirrhosis with portal hypertension. Thickening of right colonic wall probable secondary to cirrhosis. 2. There are distended small bowel loops in right lower quadrant. Fecal like material noted in terminal small bowel loops. Findings are consistent with significant ileus or early bowel obstruction. 3. There is a thickened wall mild distended right inguinal hernia containing ascites. No bowel loops are noted within hernia. 4. Significant distension of the rectum with stool measures 8 x 7.2 cm in diameter consistent with fecal impaction. 5. Left nonobstructive nephrolithiasis. No hydronephrosis or hydroureter   Review of Systems:    Not obtained, patient confused    Physical Exam:  Vital signs in last 24 hours: Temp:  [97.7 F (36.5 C)-98.1 F (36.7 C)] 98.1 F (36.7 C) (11/18 0616) Pulse Rate:  [52-115] 115 (11/18 0616) Resp:  [18-20] 18 (11/18 0616) BP: (96-115)/(40-60) 113/58 mmHg (11/18 0616) SpO2:  [95 %-98 %] 98 % (11/18 0616) Weight:  [106 lb 4.2 oz (48.2 kg)] 106 lb 4.2 oz (48.2 kg) (11/18 0616) Last BM Date: 09/21/14 General:   Pleasant white male in NAD Head:  Normocephalic and atraumatic. Eyes:   No icterus.   Conjunctiva pink. Ears:  Normal auditory acuity. Neck:  Supple; no masses felt Lungs:  Respirations even and unlabored. RLL diminished breath sounds and crackles. He is coughing  Heart:  Regular rate and rhythm Abdomen:  Soft, mildly distended, mild diffuse tenderness. Normal bowel sounds.  Rectal:  Impacted with stool.  Msk:  Symmetrical without gross deformities.  Extremities:  Trace BLE edema. Neurologic:  Alert , oriented to place but not time. Mumbling unrelated things. Positive asterixis Skin:  Intact without significant lesions or rashes. Cervical Nodes:  No significant cervical  adenopathy. Psych:  Alert and cooperative. Normal affect.  LAB RESULTS:  Recent Labs  09/21/14 0401 09/22/14 0532 09/23/14 0420  WBC 14.7* 11.0* 11.3*  HGB 8.2* 8.1* 8.5*  HCT 24.5* 24.6* 25.6*  PLT 126* 125* 124*   BMET  Recent Labs  09/21/14 0401 09/22/14 0532 09/23/14 0420  NA 140 143 142  K 3.6* 3.4* 3.7  CL 107 109 111  CO2 23 24 23   GLUCOSE 119* 122* 119*  BUN 48* 41* 31*  CREATININE 1.35 1.13 1.05  CALCIUM 8.3* 8.4 8.1*   LFT  Recent Labs  09/23/14 0420  PROT 6.4  ALBUMIN 1.6*  AST 63*  ALT 22  ALKPHOS 297*  BILITOT 1.2   STUDIES: US Paracentesis  09/22/2014   INDICATION: Cirrhosis, recurrent ascites. Request is made for diagnostic and therapeutic paracentesis  EXAM: ULTRASOUND-GUIDED DIAGNOSTIC AND THERAPEUTIC PARACENTESIS  COMPARISON:  PRIOR PARACENTESIS ON 08/27/2014  MEDICATIONS: None.  COMPLICATIONS: None immediate  TECHNIQUE: Informed written consent was obtained from the patient after a discussion of the risks, benefits and alternatives to treatment. A timeout was performed prior to the initiation of the procedure.  Initial ultrasound scanning demonstrates a moderate amount of ascites within the right mid to lower abdominal quadrant. The right mid to lower abdomen was prepped and draped in the usual sterile fashion. 1% lidocaine was used for local anesthesia. Under direct ultrasound guidance, a 19 gauge, 7-cm, Yueh catheter was introduced. An ultrasound image was saved for documentation purposed. The paracentesis was performed. The catheter was removed and a dressing was applied. The patient tolerated the procedure well without immediate post procedural complication.  FINDINGS: A total of approximately 3.8 liters of clear, yellow fluid was removed. Samples were sent to the laboratory as requested by the clinical team.  IMPRESSION: Successful ultrasound-guided diagnostic and therapeutic paracentesis yielding 3.8 liters of peritoneal fluid.  Read by: Jeananne Rama, PA-C   Electronically Signed   By: Irish Lack M.D.   On: 09/22/2014 16:24     PREVIOUS ENDOSCOPIES:            EGD March 2013 - 3 columns of small distal esoph varices, portal gastropathy. Duodenal stenosis (easily traversed)    Impression / Plan:   35. 80 year old male with decompensated cirrhosis evidenced by ascites and hepatic encephalopathy. He has been deteriorating over last couple of months..Normal AFP May 2015.  No liver lesions on Ultrasound in August. Small varices on last EGD March 2015. Patient may not have been absorbing lactulose at home because of nausea and vomiting.Constipation may be contributing to encephalopathy.  Patient has asterixis on exam, speech is slurred and he is confused about some things. He had a fecal impaction on admission CTscan and is still impacted on physical exam. Will give 1/2 dose of Mg+citrate now, dulcolax suppository BID, continue Xifaxan, will increase lactulose dose.   2. nausea and vomiting (prior to admission). CTscan suggests Ileus or partial small bowel obstruction? He had fecal like material in distal small bowel.. Need to purge bowels then get am KUB.  3. AKI, improving. He seems to be a little dry. Urine is dark, he is very thirsty. Sodium 140. Will temporary increase IVF to 100cc/hr.   4. PNA, ?aspiration. SLP evaluated. Altered mental  status may have contributed to aspiration. There is question of esophageal component because of frequent belching and sensation of backflow into throat leading to cough. We can evaluate further once mental status improves.   5. Normocytic anemia. Hgb about baseline. No overt bleeding.    LOS: 4 days   Willette Cluster  09/23/2014, 9:06 AM  Attending MD note:   I have taken a history, examined the patient, and reviewed the chart. I agree with the Advanced Practitioner's impression and recommendations. Fecal impaction on my exam today, may be a contributing factor in mental deterioration. Will  start a laxative regimen. as per  Orders. He appears "dry", will  Give some extra IV fluids. Will follow with you.  Willa Rough [facility]enter[facility] Pager # [phone]

## 2014-09-23 NOTE — Progress Notes (Signed)
Physical Therapy Treatment Patient Details Name: Kirk [name] MRN: [id number] DOB: [birth year] Today's Date: 09/23/2014    History of Present Illness Mr. Kirk [name] is an 80 year old white male with cryptogenic cirrhosis complicated by varices, ascites, and encephalopathy, ischemic cardiomyopathy (EF 40%) who presented to the emergency department with declining mental status over the past 2-3 days. Patient has had cirrhosis with multiple complications    PT Comments    Pt able to initiate gait training this tx of 25 feet on RA.  Pt limited by fatigue and weakness.  Pt was unsafe/unsteady with gait this session.   Follow Up Recommendations  SNF  Pt progressing slowly     Equipment Recommendations  None recommended by PT    Recommendations for Other Services OT consult     Precautions / Restrictions Precautions Precautions: Fall Restrictions Weight Bearing Restrictions: No    Mobility  Bed Mobility Overal bed mobility: Needs Assistance Bed Mobility: Sit to Supine       Sit to supine: Min assist;HOB elevated   General bed mobility comments: increased time; VCs for sequencing; required handheld A  Transfers Overall transfer level: Needs assistance Equipment used: Rolling walker (2 wheeled) Transfers: Sit to/from Stand Sit to Stand: Min assist         General transfer comment: x2 from EOB and bathroom commode; VCs for hand placement; significantly increased time;   Ambulation/Gait Ambulation/Gait assistance: Min assist Ambulation Distance (Feet): 25 Feet Assistive device: Rolling walker (2 wheeled) Gait Pattern/deviations: Step-through pattern;Decreased stride length;Trunk flexed;Narrow base of support Gait velocity: decr   General Gait Details: pt unsafe and unsteady with gait; pt requires min-mod verbal and tactile cues for sequencing and RW management, especially for naviagating in and out of bathroom; significantly increased time; pt limited by fatigue; O2  sats 92% on RA   Stairs            Wheelchair Mobility    Modified Rankin (Stroke Patients Only)       Balance                                    Cognition Arousal/Alertness: Awake/alert Behavior During Therapy: WFL for tasks assessed/performed Overall Cognitive Status: Within Functional Limits for tasks assessed                      Exercises      General Comments        Pertinent Vitals/Pain Pain Assessment: No/denies pain    Home Living                      Prior Function            PT Goals (current goals can now be found in the care plan section) Progress towards PT goals: Progressing toward goals    Frequency  Min 3X/week    PT Plan Current plan remains appropriate    Co-evaluation             End of Session Equipment Utilized During Treatment: Gait belt Activity Tolerance: Patient limited by fatigue Patient left: in chair;with call bell/phone within reach;with chair alarm set;with nursing/sitter in room     Time: 0920-0959 PT Time Calculation (min) (ACUTE ONLY): 39 min  Charges:                       G Codes:  Miller,Derrick, SPTA 09/23/2014, 1:27 PM   Reviewed above  Felecia ShellingLori Pearlie Nies  PTA [facility]  Acute  Rehab Pager      [phone]

## 2014-09-24 ENCOUNTER — Inpatient Hospital Stay (HOSPITAL_COMMUNITY): Payer: Medicare Other

## 2014-09-24 DIAGNOSIS — E43 Unspecified severe protein-calorie malnutrition: Secondary | ICD-10-CM

## 2014-09-24 DIAGNOSIS — K7460 Unspecified cirrhosis of liver: Secondary | ICD-10-CM

## 2014-09-24 DIAGNOSIS — I8510 Secondary esophageal varices without bleeding: Secondary | ICD-10-CM

## 2014-09-24 LAB — COMPREHENSIVE METABOLIC PANEL
ALBUMIN: 1.6 g/dL — AB (ref 3.5–5.2)
ALK PHOS: 265 U/L — AB (ref 39–117)
ALT: 25 U/L (ref 0–53)
AST: 63 U/L — AB (ref 0–37)
Anion gap: 9 (ref 5–15)
BILIRUBIN TOTAL: 1 mg/dL (ref 0.3–1.2)
BUN: 27 mg/dL — ABNORMAL HIGH (ref 6–23)
CO2: 21 mEq/L (ref 19–32)
Calcium: 8.2 mg/dL — ABNORMAL LOW (ref 8.4–10.5)
Chloride: 117 mEq/L — ABNORMAL HIGH (ref 96–112)
Creatinine, Ser: 0.97 mg/dL (ref 0.50–1.35)
GFR calc Af Amer: 85 mL/min — ABNORMAL LOW (ref >90)
GFR calc non Af Amer: 73 mL/min — ABNORMAL LOW (ref >90)
Glucose, Bld: 142 mg/dL — ABNORMAL HIGH (ref 70–99)
POTASSIUM: 3.7 meq/L (ref 3.7–5.3)
Sodium: 147 mEq/L (ref 137–147)
Total Protein: 6.7 g/dL (ref 6.0–8.3)

## 2014-09-24 LAB — CBC
HCT: 26.6 % — ABNORMAL LOW (ref 39.0–52.0)
Hemoglobin: 8.9 g/dL — ABNORMAL LOW (ref 13.0–17.0)
MCH: 30.4 pg (ref 26.0–34.0)
MCHC: 33.5 g/dL (ref 30.0–36.0)
MCV: 90.8 fL (ref 78.0–100.0)
Platelets: 140 10*3/uL — ABNORMAL LOW (ref 150–400)
RBC: 2.93 MIL/uL — ABNORMAL LOW (ref 4.22–5.81)
RDW: 16.3 % — AB (ref 11.5–15.5)
WBC: 13.8 10*3/uL — AB (ref 4.0–10.5)

## 2014-09-24 LAB — AMMONIA: Ammonia: 67 umol/L — ABNORMAL HIGH (ref 11–60)

## 2014-09-24 MED ORDER — LORAZEPAM 2 MG/ML IJ SOLN: 1.0000 mg | INTRAMUSCULAR | Status: DC | PRN

## 2014-09-24 MED ORDER — DEXTROSE 5 % IV SOLN
1.0000 mg/h | INTRAVENOUS | Status: DC
  Administered 2014-09-24: 1 mg/h via INTRAVENOUS
  Filled 2014-09-24: qty 10

## 2014-09-24 NOTE — Progress Notes (Signed)
SLP Cancellation Note  Patient Details Name: Kirk [name] MRN: [id number] DOB: 10/30/1927   Cancelled treatment:       Reason Eval/Treat Not Completed:  (pt now comfort care only, for dc to [facility] tomorrow per chart review)   [name] [name], MS [facility] SLP [phone]

## 2014-09-24 NOTE — Progress Notes (Signed)
Spoke with Mrs. [name] (pt's wife), concerning discharge plans. [name], if there are no beds available there she selected [facility]. Referral given to CSW .

## 2014-09-24 NOTE — Consult Note (Signed)
[facility] Liaison: Met with spouse and close family friends to confirm interest and complete registration paper work in [facility] transfer 09/25/2014. Dr. Reynaldo Minium confirmed to attend with symptom management by Dr. Tomasa Hosteller. Please fax discharge summary to [phone]. RN please call report to [phone]. Please arrange transport for patient to arrive before noon if possible. Thank you. Erling Conte LCSW [phone]

## 2014-09-24 NOTE — Progress Notes (Signed)
CS[facility] Cookie that [facility]. CSW made referral to Forrestine HimEva Davis, [facility] - awaiting response re: bed availability.   Lincoln MaxinKelly Dianelly Ferran, LCSW [facility] Clinical Social Worker cell #: [phone]

## 2014-09-24 NOTE — Plan of Care (Signed)
Problem: Phase II Progression Outcomes Goal: Discharge plan established Outcome: Completed/Met Date Met:  09/24/14 Goal: Other Phase II Outcomes/Goals Outcome: Completed/Met Date Met:  09/24/14

## 2014-09-24 NOTE — Progress Notes (Signed)
Lengthy discussions with patient, team and wife.  Patient clearly declined since admission interventions.  He is miserable with pain, understands that he is terminal and wishes comfort care.  Primary illness is terminal and he has no chance of quality recovery.  In keeping with his and wife wishes, we are switching to a pure palliative care approach.  See orders

## 2014-09-24 NOTE — Progress Notes (Signed)
Pt had a 6 beat run of VTach, pt asleep, pt asymptomatic. Will continue to monitor.

## 2014-09-24 NOTE — Consult Note (Signed)
[facility] Liaison: [facility] room available for Mr. [name] tomorrow 09/25/14. No family present at this time. Hope to follow up with family later this afternoon. CSW [place] aware. Await response from Dr. Jacky KindleAronson re [facility]. Forrestine Himva Jaecion Dempster LCSW [phone]

## 2014-09-24 NOTE — Progress Notes (Signed)
[facility] Progress Note  Subjective:  Pt still mildly confused, speech minimally slurred.  Pt had a  Large BM last pm.Still with some nausea but has not vomited.Drinking water this morning--on 2nd cup. Denies regurgitation.   Objective:  Vital signs in last 24 hours: Temp:  [97.6 F (36.4 C)-98.8 F (37.1 C)] 97.7 F (36.5 C) (11/19 0801) Pulse Rate:  [49-86] 49 (11/19 0801) Resp:  [16] 16 (11/19 0801) BP: (101-118)/(41-54) 107/51 mmHg (11/19 0801) SpO2:  [94 %-97 %] 96 % (11/19 0801) Weight:  [112 lb 3.4 oz (50.9 kg)] 112 lb 3.4 oz (50.9 kg) (11/19 0447) Last BM Date: 09/21/14 General:   Alert,  Well-developed, mildly confused in NAD Heart:  Regular rate and rhythm; no murmurs Pulm;diminished breath sounds throughout Abdomen:  Soft, mildly distended, mild diffuse tenderness, Normal bowel sounds, without guarding, and without rebound.   Extremities:  Trace LE edema bilat Neurologic:  Alert , mumbling about being out of pain pills,  Mild asterixis     Lab Results:  Recent Labs  09/22/14 0532 09/23/14 0420 09/24/14 0516  WBC 11.0* 11.3* 13.8*  HGB 8.1* 8.5* 8.9*  HCT 24.6* 25.6* 26.6*  PLT 125* 124* 140*   BMET  Recent Labs  09/22/14 0532 09/23/14 0420 09/24/14 0516  NA 143 142 147  K 3.4* 3.7 3.7  CL 109 111 117*  CO2 24 23 21   GLUCOSE 122* 119* 142*  BUN 41* 31* 27*  CREATININE 1.13 1.05 0.97  CALCIUM 8.4 8.1* 8.2*   LFT  Recent Labs  09/24/14 0516  PROT 6.7  ALBUMIN 1.6*  AST 63*  ALT 25  ALKPHOS 265*  BILITOT 1.0      [place] Paracentesis  09/22/2014   INDICATION: Cirrhosis, recurrent ascites. Request is made for diagnostic and therapeutic paracentesis  EXAM: ULTRASOUND-GUIDED DIAGNOSTIC AND THERAPEUTIC PARACENTESIS  COMPARISON:  PRIOR PARACENTESIS ON 08/27/2014  MEDICATIONS: None.  COMPLICATIONS: None immediate  TECHNIQUE: Informed written consent was obtained from the patient after a discussion of the risks, benefits and  alternatives to treatment. A timeout was performed prior to the initiation of the procedure.  Initial ultrasound scanning demonstrates a moderate amount of ascites within the right mid to lower abdominal quadrant. The right mid to lower abdomen was prepped and draped in the usual sterile fashion. 1% lidocaine was used for local anesthesia. Under direct ultrasound guidance, a 19 gauge, 7-cm, Yueh catheter was introduced. An ultrasound image was saved for documentation purposed. The paracentesis was performed. The catheter was removed and a dressing was applied. The patient tolerated the procedure well without immediate post procedural complication.  FINDINGS: A total of approximately 3.8 liters of clear, yellow fluid was removed. Samples were sent to the laboratory as requested by the clinical team.  IMPRESSION: Successful ultrasound-guided diagnostic and therapeutic paracentesis yielding 3.8 liters of peritoneal fluid.  Read by: Jeananne RamaKevin Allred, PA-C   Electronically Signed   By: Irish LackGlenn  Yamagata M.D.   On: 09/22/2014 16:24    ASSESSMENT/PLAN:  1. 80 yo male with decompensated cirrhosis with ascites and hepatic encephalopathy. AFP normal in  May 2015 and no liver lesions noted on ultrasound in August. Small varices on EGD in March 2015.  Pt  on lactulose and had large BM last night which may help clear encephalopathy. Will check KUB today to see if impaction clearing.  2. Nausea and vomiting. CT  Suggested ileus or partial SBO. Will check KUB.  3. AKI.  Creat0.97, BUN 27. Improving.  4. PNA, ? Aspiration.  Altered mental status may have contributed to aspiration.Pt drinking water with no cough.  Not belching.  5. Normocytic anemia. Hgb about baseline. No overt bleeding.   LOS: 5 days   Hvozdovic, Moise BoringLori P PA-C 09/24/2014, Pager [phone] Attending MD note:   I have taken a history,  and reviewed the chart. I agree with the Advanced Practitioner's impression and recommendations. Continue laxative  regimen. No new suggestions.  Willa Roughora Brodie,MD [facility] Pager # [phone]

## 2014-09-25 LAB — BODY FLUID CULTURE: CULTURE: NO GROWTH

## 2014-09-25 MED ORDER — MORPHINE SULFATE 10 MG/ML IJ SOLN: 1.0000 mg/h | INTRAVENOUS | Status: AC

## 2014-09-25 MED ORDER — ONDANSETRON HCL 4 MG/2ML IJ SOLN: 4.0000 mg | Freq: Four times a day (QID) | INTRAMUSCULAR | Status: AC | PRN

## 2014-09-25 MED ORDER — LORAZEPAM 2 MG/ML IJ SOLN: 1.0000 mg | INTRAMUSCULAR | Status: AC | PRN

## 2014-09-25 NOTE — Progress Notes (Addendum)
Chaplain provided brief support with family while rounding on palliative care list.  Pt preparing for discharge to [facility].  Supported by family and friends.  No needs expressed at time.

## 2014-09-25 NOTE — Progress Notes (Addendum)
Approx 75 ml of morphine wasted from IV bag into IV disposal bin on unit at time of patient's discharge, witnessed by Guy Francoravia Benjamin, RN.

## 2014-09-25 NOTE — Progress Notes (Signed)
[facility] is set to [facility]. Patient & wife aware. Discharge packet given to RN, Darcel BayleyKamna. PTAR will be called once wife arrives to hospital.   Lincoln MaxinKelly Byron Tipping, LCSW [facility] Clinical Social Worker cell #: [phone]

## 2014-09-25 NOTE — Progress Notes (Signed)
Report given to Herbert SetaHeather, Charity fundraiserN at [facility].  CSW has called for transport.

## 2014-09-25 NOTE — Discharge Summary (Signed)
DISCHARGE SUMMARY  Kirk [name]  MR#: [id number]  DOB:06/27/1927  Date of Admission: 09/19/2014 Date of Discharge: 09/25/2014  Attending Physician:Ayo Smoak A  Patient's JXB:JYNWGNF,AOZHYQM A, MD  Consults:Treatment Team:  Palliative [facility]  Discharge Diagnoses: Active Problems:   Atrial fibrillation   Esophageal varices in cirrhosis, mild pportal gastropathy   Hepatic cirrhosis   Ascites   Hepatic encephalopathy   Chronic combined systolic and diastolic CHF (congestive heart failure)   Protein-calorie malnutrition, severe   Small bowel obstruction   Acute renal failure   Discharge Medications:   Medication List    STOP taking these medications        escitalopram 10 MG tablet  Commonly known as:  LEXAPRO     furosemide 20 MG tablet  Commonly known as:  LASIX     gabapentin 100 MG capsule  Commonly known as:  NEURONTIN     hydrALAZINE 25 MG tablet  Commonly known as:  APRESOLINE     hyoscyamine 0.125 MG SL tablet  Commonly known as:  LEVSIN SL     lactulose 10 GM/15ML solution  Commonly known as:  CHRONULAC     lidocaine 5 %  Commonly known as:  LIDODERM     ondansetron 4 MG tablet  Commonly known as:  ZOFRAN  Replaced by:  ondansetron 4 MG/2ML Soln injection     oxyCODONE 5 MG immediate release tablet  Commonly known as:  Oxy IR/ROXICODONE     pantoprazole 40 MG tablet  Commonly known as:  PROTONIX     rifaximin 550 MG Tabs tablet  Commonly known as:  XIFAXAN     silver sulfADIAZINE 1 % cream  Commonly known as:  SILVADENE     spironolactone 50 MG tablet  Commonly known as:  ALDACTONE     triamcinolone cream 0.1 %  Commonly known as:  KENALOG     triamcinolone cream 0.5 %  Commonly known as:  KENALOG      TAKE these medications        LORazepam 2 MG/ML injection  Commonly known as:  ATIVAN  Inject 0.5 mLs (1 mg total) into the vein every 4 (four) hours as needed for anxiety.     morphine 100 mg in dextrose 5 % 100 mL   Inject 1 mg/hr into the vein continuous.     ondansetron 4 MG/2ML Soln injection  Commonly known as:  ZOFRAN  Inject 2 mLs (4 mg total) into the vein every 6 (six) hours as needed for nausea or vomiting.        Hospital Procedures: Ct Abdomen Pelvis Wo Contrast  09/19/2014   CLINICAL DATA:  Abdominal pain weakness, dehydration not eating for several days, liver failure  EXAM: CT ABDOMEN AND PELVIS WITHOUT CONTRAST  TECHNIQUE: Multidetector CT imaging of the abdomen and pelvis was performed following the standard protocol without IV contrast.  COMPARISON:  08/06/2012.  FINDINGS: Lung bases shows bilateral basilar atelectasis. Sagittal images of the spine shows diffuse osteopenia. Degenerative changes are noted thoracolumbar spine. Small left pleural effusion is noted. Mild distended gallbladder without calcified gallstones.  There is moderate to significant abdominal ascites. Markedly atrophic liver with nodular consistent with cirrhosis. The spleen measures 9.8 cm in length. Prominent size portal vein measures 1.6 cm in diameter consistent with portal hypertension. The stomach is empty collapsed.  Mild distended small bowel loops are noted in right lower quadrant. Distended terminal small bowel loops are noted with fecal like material. Findings are consistent with significant ileus  or early bowel obstruction. There is a thickened wall left inguinal hernia containing ascites. The hernia is mild distended measures about 5.2 cm. No bowel loops are identified within hernia. There is a distended rectum with significant stool measures 8 cm in diameter. Findings highly suspicious for fecal impaction. Small pelvic ascites is noted. Some stool noted in transverse colon. Extensive atherosclerotic calcifications of abdominal aorta, SMA and iliac arteries. No aortic aneurysm. Splenic artery calcifications. There is thickening of right colonic wall as seen in axial image 56 probable post cirrhosis and portal  hypertension  Unenhanced kidneys are symmetrical in size. Nonobstructive calcification in upper pole of the left kidney measures 2.5 mm. No hydronephrosis or hydroureter.  IMPRESSION: 1. There is cirrhotic atrophic liver. Large abdominal ascites. Prominent size portal vein measures 1.6 cm in diameter. Findings are consistent with cirrhosis with portal hypertension. Thickening of right colonic wall probable secondary to cirrhosis. 2. There are distended small bowel loops in right lower quadrant. Fecal like material noted in terminal small bowel loops. Findings are consistent with significant ileus or early bowel obstruction. 3. There is a thickened wall mild distended right inguinal hernia containing ascites. No bowel loops are noted within hernia. 4. Significant distension of the rectum with stool measures 8 x 7.2 cm in diameter consistent with fecal impaction. 5. Left nonobstructive nephrolithiasis. No hydronephrosis or hydroureter.   Electronically Signed   By: Natasha MeadLiviu  Pop M.D.   On: 09/19/2014 11:28   Dg Chest 2 View  09/20/2014   CLINICAL DATA:  80 year old male with aspiration  EXAM: CHEST  2 VIEW  COMPARISON:  CT abdomen/ pelvis 09/19/2014; most recent prior acute abdominal series including chest x-ray 09/09/2014  FINDINGS: Stable cardiac and mediastinal contours. Patient is status post median sternotomy. Rightward rotated position on today's exam. New patchy airspace opacity in the posterior right lower lobe. There may be a small right pleural effusion. The left lung remains relatively clear. No pneumothorax. No acute osseous abnormality.  IMPRESSION: Patchy airspace opacity in the right lower lobe concerning for pneumonia or pneumonitis. Aspiration is a possibility given the clinical history.  Probable small right pleural effusion.   Electronically Signed   By: Malachy MoanHeath  McCullough M.D.   On: 09/20/2014 09:22   Dg Abd 1 View  09/24/2014   CLINICAL DATA:  Colitis, diverticulitis, constipation  EXAM:  ABDOMEN - 1 VIEW  COMPARISON:  11/15/ 15  FINDINGS: Mild gaseous distended small bowel loop in right abdomen with improvement from prior exam. Gas noted in transverse colon. Moderate stool within rectum.  IMPRESSION: Residual mild gaseous distended small bowel loop in right abdomen with improvement from prior exam, probable improving ileus. Gas noted in transverse colon. Moderate stool within rectum.   Electronically Signed   By: Natasha MeadLiviu  Pop M.D.   On: 09/24/2014 11:28   Dg Abd 1 View  09/20/2014   CLINICAL DATA:  Small bowel obstruction  EXAM: ABDOMEN - 1 VIEW  COMPARISON:  CT scan 09/19/2014  FINDINGS: There are gaseous distended small bowel loops throughout the abdomen consistent with ileus or partial small bowel obstruction. Moderate stool within rectum measures 6.2 cm in diameter suspicious for mild fecal impaction.  IMPRESSION: Gaseous distended small bowel loops within abdomen consistent with ileus or partial small bowel obstruction.   Electronically Signed   By: Natasha MeadLiviu  Pop M.D.   On: 09/20/2014 12:40   [place] Paracentesis  09/22/2014   INDICATION: Cirrhosis, recurrent ascites. Request is made for diagnostic and therapeutic paracentesis  EXAM: ULTRASOUND-GUIDED DIAGNOSTIC  AND THERAPEUTIC PARACENTESIS  COMPARISON:  PRIOR PARACENTESIS ON 08/27/2014  MEDICATIONS: None.  COMPLICATIONS: None immediate  TECHNIQUE: Informed written consent was obtained from the patient after a discussion of the risks, benefits and alternatives to treatment. A timeout was performed prior to the initiation of the procedure.  Initial ultrasound scanning demonstrates a moderate amount of ascites within the right mid to lower abdominal quadrant. The right mid to lower abdomen was prepped and draped in the usual sterile fashion. 1% lidocaine was used for local anesthesia. Under direct ultrasound guidance, a 19 gauge, 7-cm, Yueh catheter was introduced. An ultrasound image was saved for documentation purposed. The paracentesis was  performed. The catheter was removed and a dressing was applied. The patient tolerated the procedure well without immediate post procedural complication.  FINDINGS: A total of approximately 3.8 liters of clear, yellow fluid was removed. Samples were sent to the laboratory as requested by the clinical team.  IMPRESSION: Successful ultrasound-guided diagnostic and therapeutic paracentesis yielding 3.8 liters of peritoneal fluid.  Read by: Jeananne RamaKevin Allred, PA-C   Electronically Signed   By: Irish LackGlenn  Yamagata M.D.   On: 09/22/2014 16:24   [place] Paracentesis  08/27/2014   INDICATION: Cirrhosis, recurrent ascites, request for paracentesis.  EXAM: ULTRASOUND-GUIDED PARACENTESIS  COMPARISON:  08/11/14 2.9 liters removed.  MEDICATIONS: None.  COMPLICATIONS: None immediate  TECHNIQUE: Informed written consent was obtained from the patient after a discussion of the risks, benefits and alternatives to treatment. A timeout was performed prior to the initiation of the procedure.  Initial ultrasound scanning demonstrates a moderate amount of ascites within the right upper abdominal quadrant. The right upper abdomen was prepped and draped in the usual sterile fashion. 1% lidocaine was used for local anesthesia. Under direct ultrasound guidance, a 19 gauge, 7-cm, Yueh catheter was introduced. An ultrasound image was saved for documentation purposed. The paracentesis was performed. The catheter was removed and a dressing was applied. The patient tolerated the procedure well without immediate post procedural complication.  FINDINGS: A total of approximately 2.6 liters of serous fluid was removed.  IMPRESSION: Successful ultrasound-guided paracentesis yielding 2.6 liters of peritoneal fluid.  Read By:  Pattricia BossKoreen Morgan PA-C   Electronically Signed   By: Gilmer MorJaime  Wagner D.O.   On: 08/27/2014 11:34   Dg Abd Acute W/chest  09/19/2014   CLINICAL DATA:  Vomiting today  EXAM: ACUTE ABDOMEN SERIES (ABDOMEN 2 VIEW & CHEST 1 VIEW)  COMPARISON:   03/28/2014, 03/29/2014  FINDINGS: Cardiac shadow is within normal limits. Postsurgical changes are noted. Mild interstitial changes are again seen but stable from the prior study. No new focal infiltrate is seen.  Scattered large and small bowel gas is noted. Considerable fecal material is noted within the colon. Multiple dilated loops of small bowel are identified predominately in the right abdomen. No free air is seen.  IMPRESSION: Small bowel obstructive change likely partial in nature given the degree of fecal material and air within the colon. No free air is noted. Correlation with physical exam is recommended.   Electronically Signed   By: Alcide CleverMark  Lukens M.D.   On: 09/19/2014 07:42    History of Present Illness:  Mr. 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 Patient has had cirrhosis with multiple complications. He is on diuretics for chronic ascites requiring repeated paracentesis (most recently 08/27/14). He  has encephalopathy treated with cephalexin and lactulose. His wife states that he has been having at least 3-4 bowel movements per day. Despite this, over the past several days he has had increasing lethargy and decreased level of consciousness. At baseline, his wife states that he ambulates, verbalizes, and even goes out to the barn in his golf cart and or to the hardware store with friends. He was doing that earlier this week. However, over the past several days he has had increased confusion, decreased level of consciousness, and decreased verbalization. He has chronic abdominal pain related to his ascites but has actually been taking less narcotics over the past several days since he has not been complaining as much of pain. His wife states that his abdominal girth has not increased  significantly. He has had persistent edema. Due to the declining mental status she brought him to the emergency department where evaluation shows an increase in BUN and creatinine and acute abdominal series suggest a small bowel obstruction. CT has been obtained with results pending at this time. He requires admission for management. Hospital Course: Patient was admitted for management of pneumonia and end stage liver disease.  Pneumonia was felt to be aspiration related to altered mental status and hepatic encephalopathy.  This was managed with iv antibiotics and was not a limiting factor.  His encephalopathy was managed with a combination of rifaximin, lactulose and metronidazole.  Despite some initial improvement he declined further.  We did perform paracentesis removing over 3 liters of ascites with no evidence of sbp.  Ascites quickly returned.  Patient remained miserable in constant discomfort requesting escalation of comfort measures.  Given his known end stage liver disease and declining quality of life, patient and wife decided to move to palliative care focusing on comfort.  A continuous morphine infusion achieved excellent results.  The morning of discharge, he is resting peacefully for the first time in a week.  Options were discussed and he will be transferred to [facility] this am.  Day of Discharge Exam BP 109/48 mmHg  Pulse 51  Temp(Src) 97.6 F (36.4 C) (Oral)  Resp 20  Ht 5\' 7"  (1.702 m)  Wt 49.6 kg (109 lb 5.6 oz)  BMI 17.12 kg/m2  SpO2 95%  Physical Exam: General appearance: no distress and slowed mentation Eyes: no scleral icterus Throat: oropharynx moist without erythema Resp: clear to auscultation bilaterally Cardio: regular rate and rhythm Extremities: no clubbing, cyanosis or edema Somnolent, nonverbal Abdomen tense with ascites, nontender  Discharge Labs:  Recent Labs  09/23/14 0420 09/24/14 0516  NA 142 147  K 3.7 3.7  CL 111 117*  CO2 23 21  GLUCOSE  119* 142*  BUN 31* 27*  CREATININE 1.05 0.97  CALCIUM 8.1* 8.2*    Recent Labs  09/23/14 0420 09/24/14 0516  AST 63* 63*  ALT 22 25  ALKPHOS 297* 265*  BILITOT 1.2 1.0  PROT 6.4 6.7  ALBUMIN 1.6* 1.6*    Recent Labs  09/23/14 0420 09/24/14 0516  WBC 11.3* 13.8*  HGB 8.5* 8.9*  HCT 25.6* 26.6*  MCV 91.4 90.8  PLT 124* 140*   No results for input(s): CKTOTAL, CKMB, CKMBINDEX, TROPONINI in the last 72 hours. No results for input(s): TSH, T4TOTAL, T3FREE, THYROIDAB in the last 72 hours.  Invalid input(s): FREET3 No results for input(s): VITAMINB12, FOLATE, FERRITIN, TIBC, IRON, RETICCTPCT in the last 72 hours.  Discharge instructions:     Discharge Instructions    Diet - low sodium heart  healthy    Complete by:  As directed      Increase activity slowly    Complete by:  As directed            Disposition: [facility]   Condition on Discharge: terminal  Tests Needing Follow-up: none  Signed: Arora Coakley A 09/25/2014, 7:58 AM

## 2014-11-06 DEATH — deceased

## 2015-01-14 IMAGING — US US PARACENTESIS
1 series · 6 of 6 positions shown · non-contrast
Comparison: Previous abdominal ultrasound

CLINICAL DATA: Abdominal distention, ascites. Request diagnostic
and therapeutic paracentesis.

EXAM:
ULTRASOUND GUIDED PARACENTESIS

[Series 1: us paracentesis · 0.27mm/px · 6 of 6 slices shown]
[im 1/6]
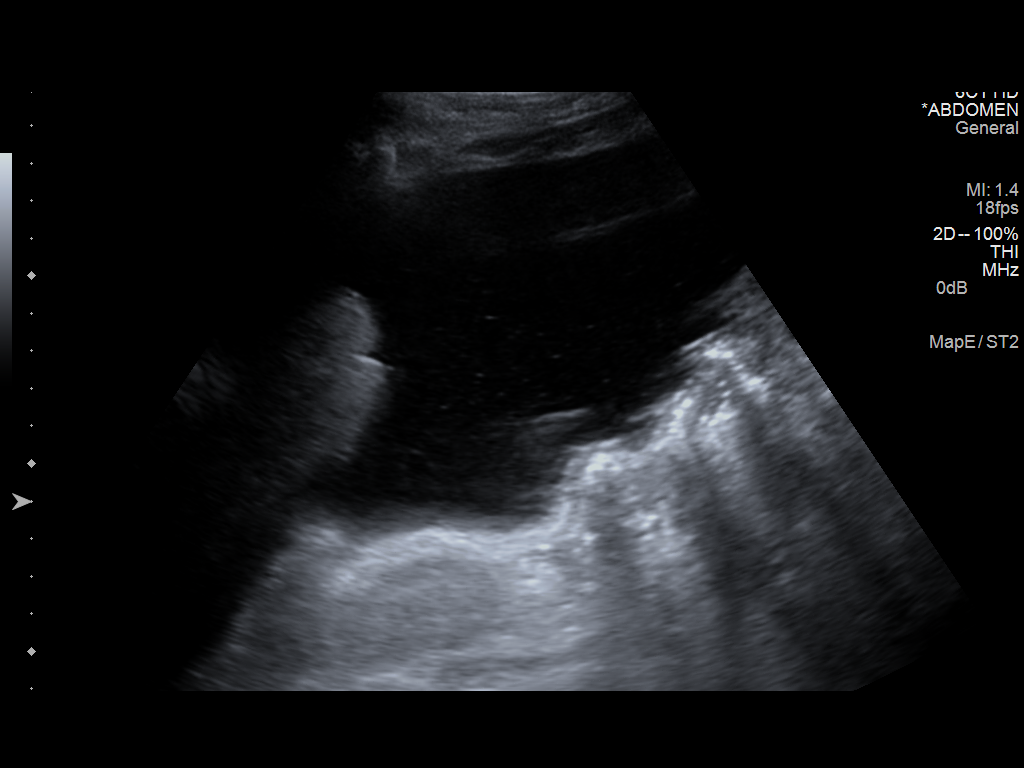
[im 2/6]
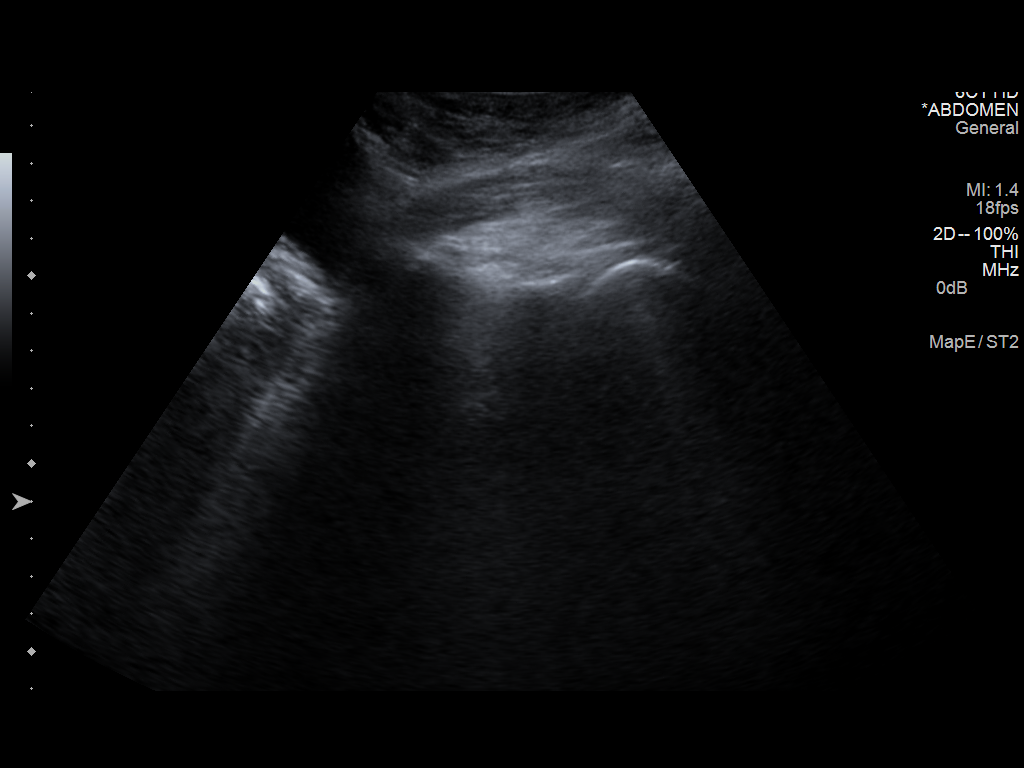
[im 3/6]
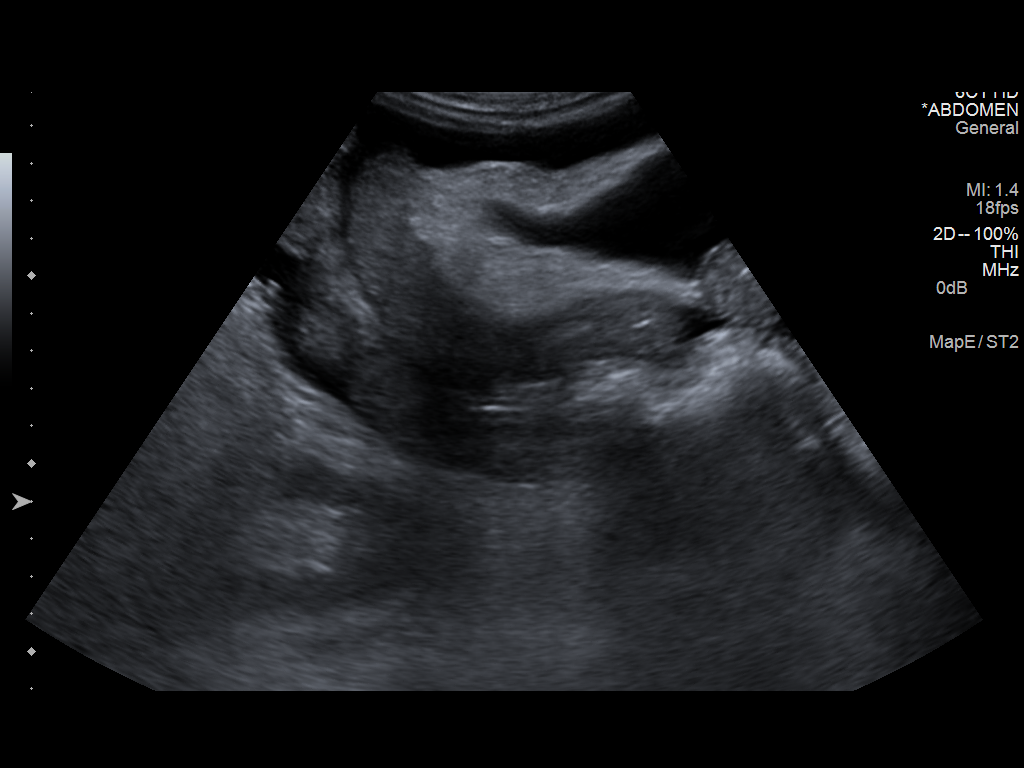
[im 4/6]
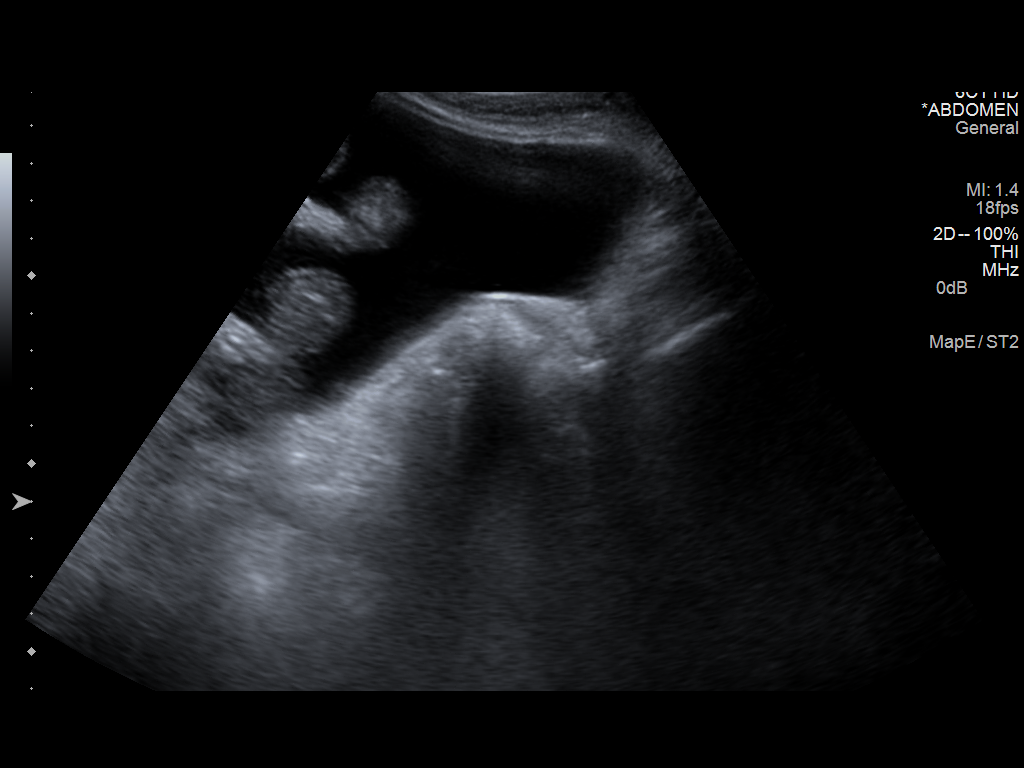
[im 5/6]
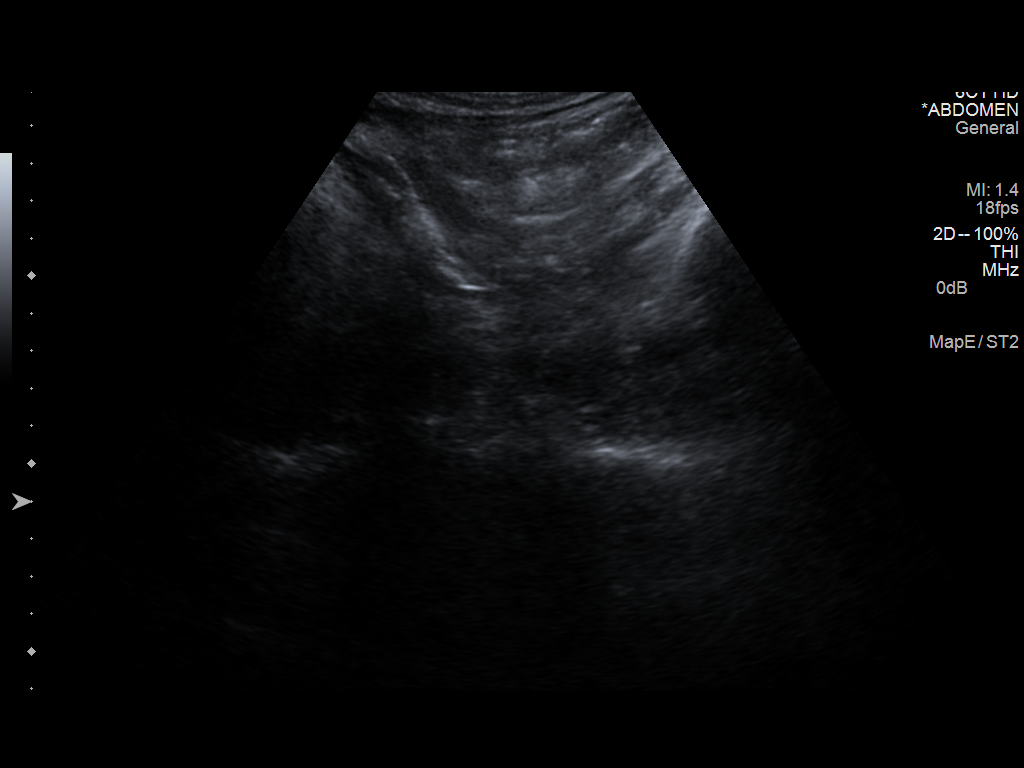
[im 6/6]
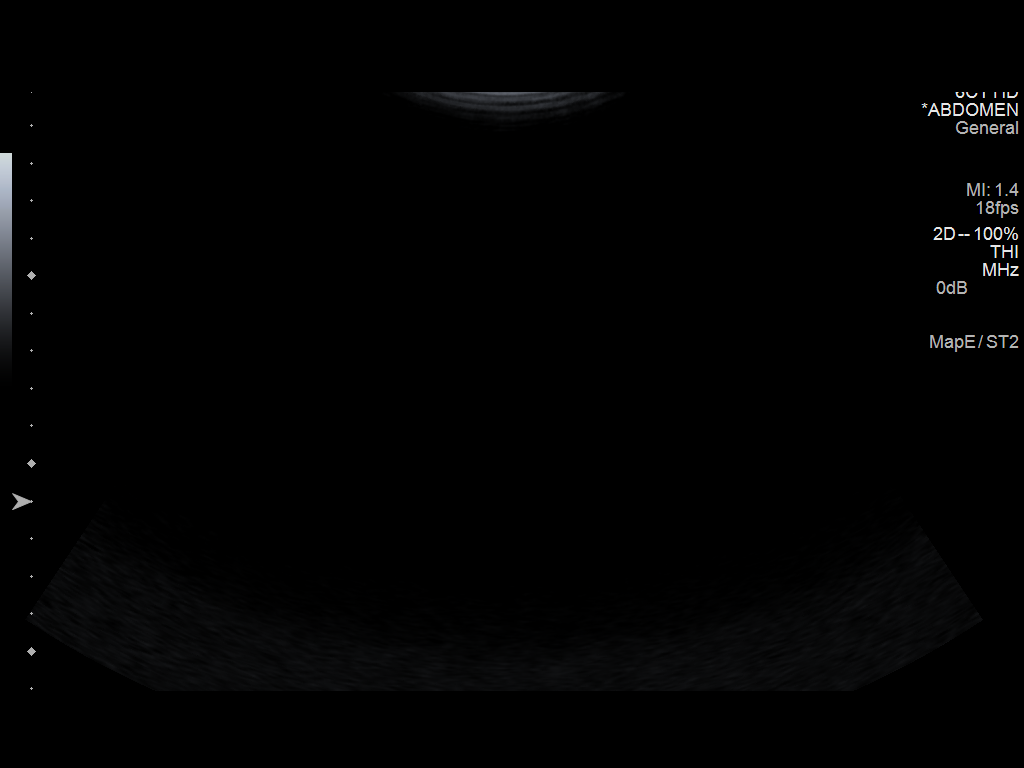

[6 of 6 positions shown; findings below may reference images not displayed]

PROCEDURE:
An ultrasound guided paracentesis was thoroughly discussed with the
patient and questions answered. The benefits, risks, alternatives
and complications were also discussed. The patient understands and
wishes to proceed with the procedure. Written consent was obtained.

Ultrasound was performed to localize and mark an adequate pocket of
fluid in the right lower quadrant of the abdomen. The area was then
prepped and draped in the normal sterile fashion. 1% Lidocaine was
used for local anesthesia. Under ultrasound guidance a 19 gauge Yueh
catheter was introduced. Paracentesis was performed. The catheter
was removed and a dressing applied.

COMPLICATIONS:
None immediate
FINDINGS: A total of approximately 5.5 L of clear yellow fluid was removed. A
fluid sample was sent for laboratory analysis.
IMPRESSION: Successful ultrasound guided paracentesis yielding 5.5 L. of
ascites.

## 2015-05-03 ENCOUNTER — Other Ambulatory Visit: Payer: Self-pay

## 2016-01-22 IMAGING — CT CT ABD-PELV W/O CM
1 of 2 series · 14 of 32 positions shown, 18 images · non-contrast
Comparison: 08/06/2012.

CLINICAL DATA: Abdominal pain weakness, dehydration not eating for
several days, liver failure

EXAM:
CT ABDOMEN AND PELVIS WITHOUT CONTRAST
TECHNIQUE: Multidetector CT imaging of the abdomen and pelvis was performed
following the standard protocol without IV contrast.

[Series 2: abd/pel w/o · axial · non-contrast · 0.69mm/px · z∈[-502,-72]mm · 14 of 94 slices shown, 18 images]
[im 4/94  soft-tissue]
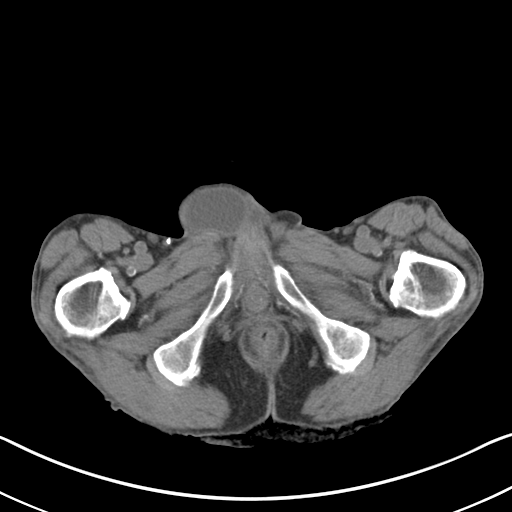
[im 4/94  bone]
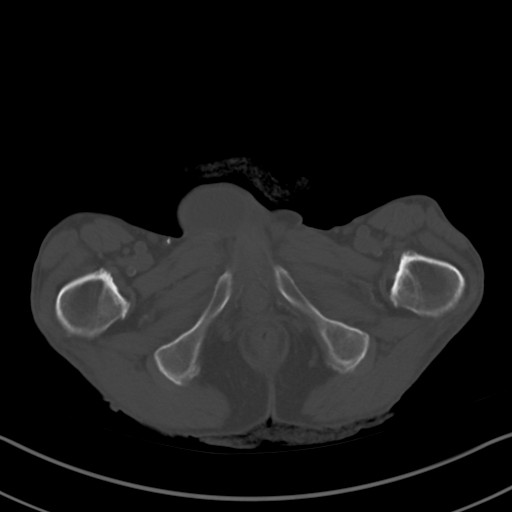
[im 12/94  soft-tissue]
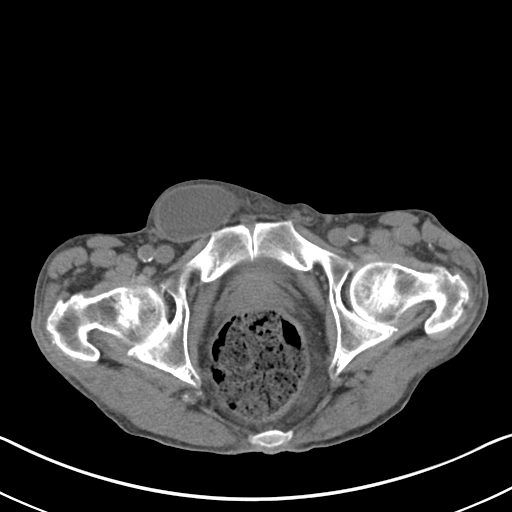
[im 20/94  soft-tissue]
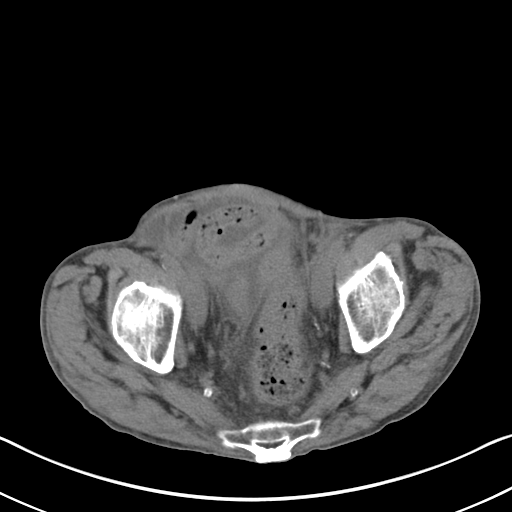
[im 28/94  soft-tissue]
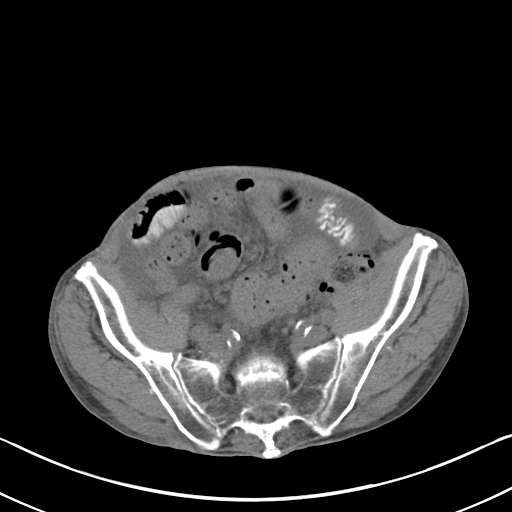
[im 35/94  soft-tissue]
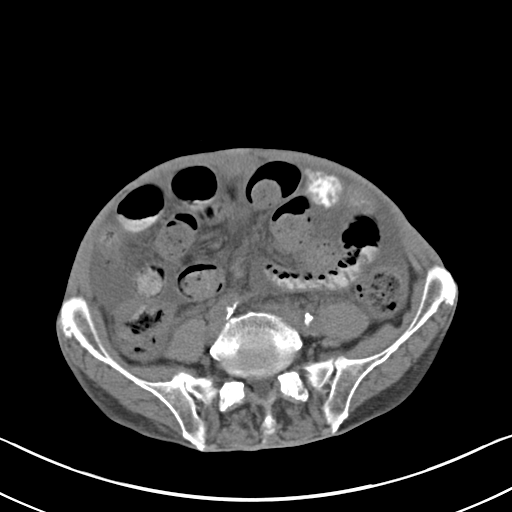
[im 43/94  soft-tissue]
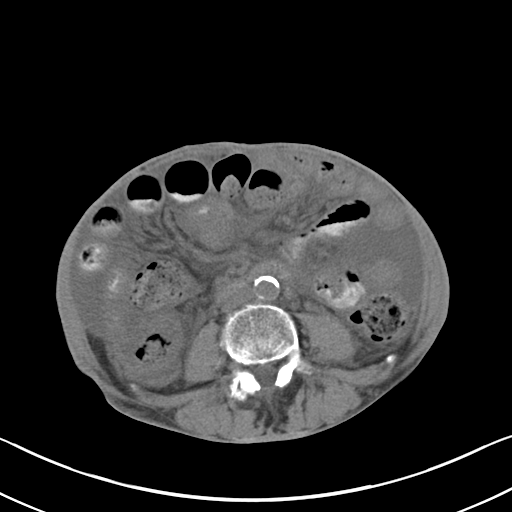
[im 51/94  soft-tissue]
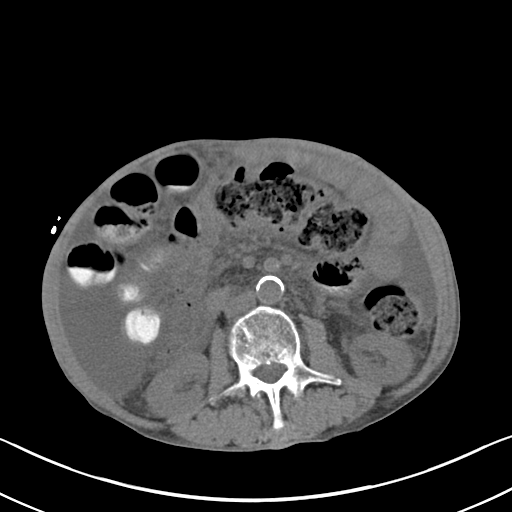
[im 59/94  soft-tissue]
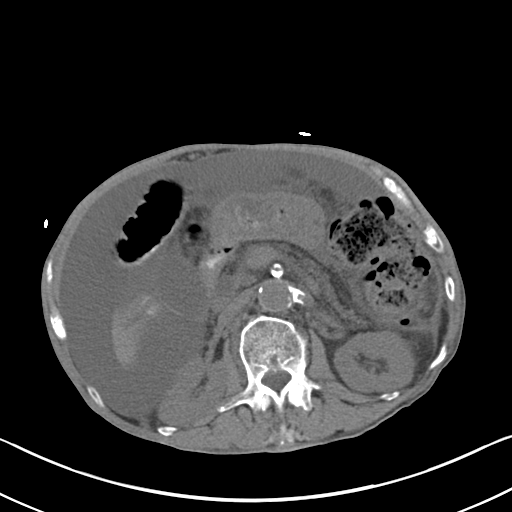
[im 66/94  soft-tissue]
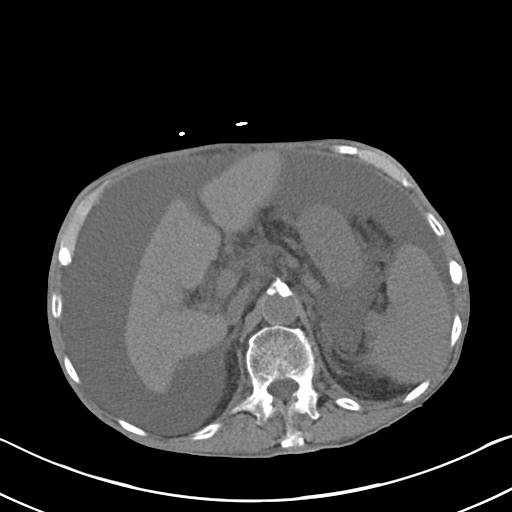
[im 66/94  bone]
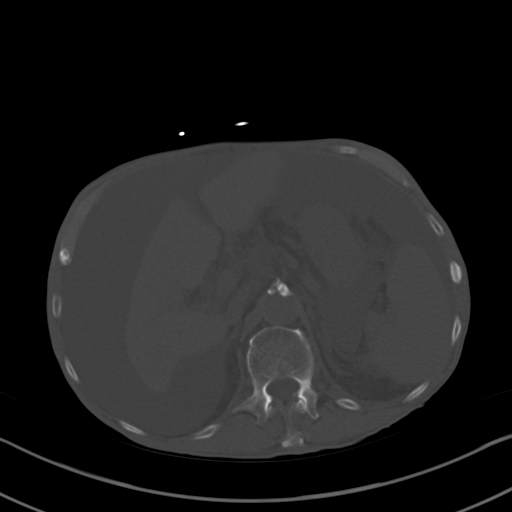
[im 74/94  soft-tissue]
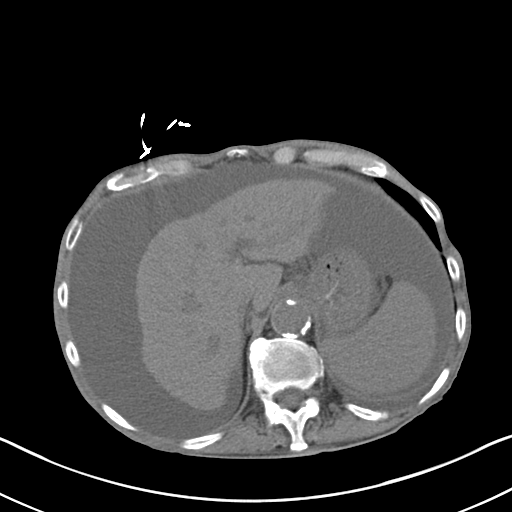
[im 78/94  lung]
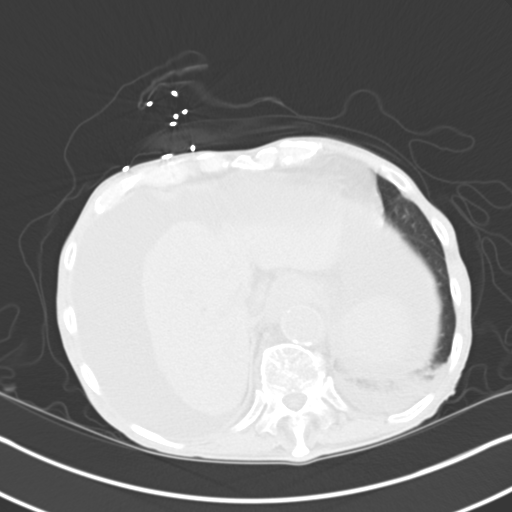
[im 82/94  soft-tissue]
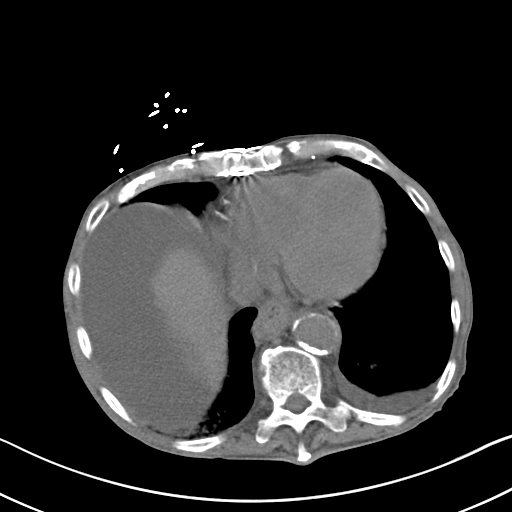
[im 82/94  lung]
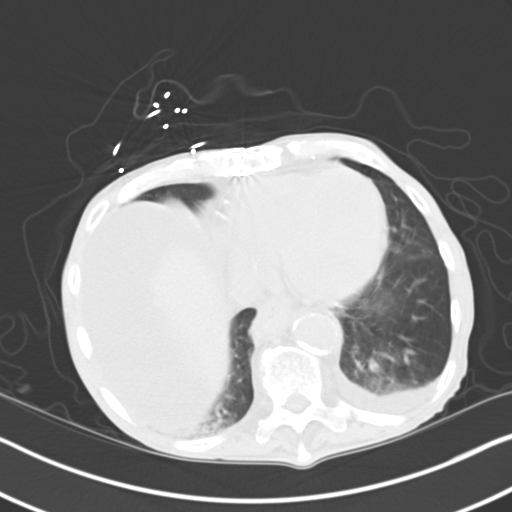
[im 86/94  lung]
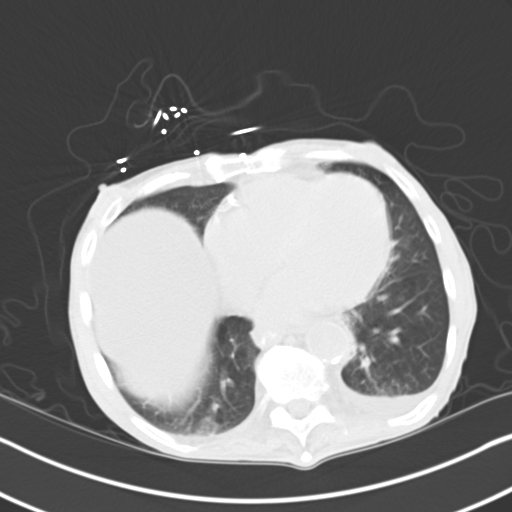
[im 90/94  soft-tissue]
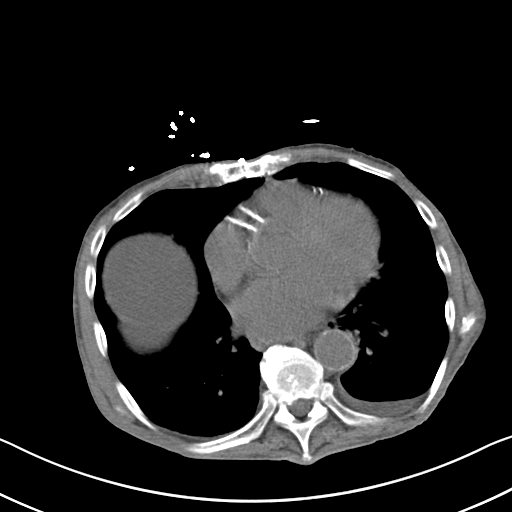
[im 90/94  lung]
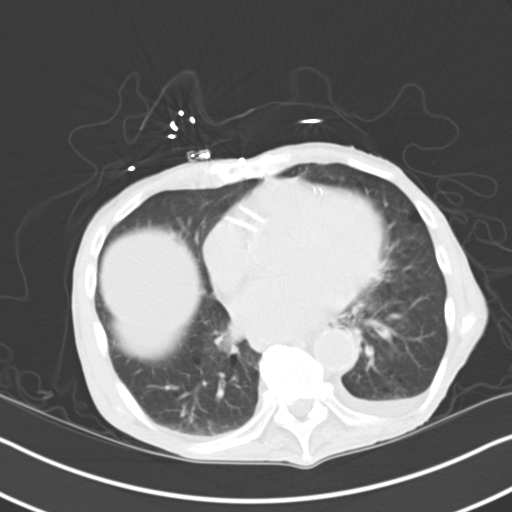

[14 of 32 positions shown; findings below may reference images not displayed]

FINDINGS: Lung bases shows bilateral basilar atelectasis. Sagittal images of
the spine shows diffuse osteopenia. Degenerative changes are noted
thoracolumbar spine. Small left pleural effusion is noted. Mild
distended gallbladder without calcified gallstones.

There is moderate to significant abdominal ascites. Markedly
atrophic liver with nodular consistent with cirrhosis. The spleen
measures 9.8 cm in length. Prominent size portal vein measures
cm in diameter consistent with portal hypertension. The stomach is
empty collapsed.

Mild distended small bowel loops are noted in right lower quadrant.
Distended terminal small bowel loops are noted with fecal like
material. Findings are consistent with significant ileus or early
bowel obstruction. There is a thickened wall left inguinal hernia
containing ascites. The hernia is mild distended measures about
cm. No bowel loops are identified within hernia. There is a
distended rectum with significant stool measures 8 cm in diameter.
Findings highly suspicious for fecal impaction. Small pelvic ascites
is noted. Some stool noted in transverse colon. Extensive
atherosclerotic calcifications of abdominal aorta, SMA and iliac
arteries. No aortic aneurysm. Splenic artery calcifications. There
is thickening of right colonic wall as seen in axial image 56
probable post cirrhosis and portal hypertension

Unenhanced kidneys are symmetrical in size. Nonobstructive
calcification in upper pole of the left kidney measures 2.5 mm. No
hydronephrosis or hydroureter.
IMPRESSION: 1. There is cirrhotic atrophic liver. Large abdominal ascites.
Prominent size portal vein measures 1.6 cm in diameter. Findings are
consistent with cirrhosis with portal hypertension. Thickening of
right colonic wall probable secondary to cirrhosis.
2. There are distended small bowel loops in right lower quadrant.
Fecal like material noted in terminal small bowel loops. Findings
are consistent with significant ileus or early bowel obstruction.
3. There is a thickened wall mild distended right inguinal hernia
containing ascites. No bowel loops are noted within hernia.
4. Significant distension of the rectum with stool measures 8 x
cm in diameter consistent with fecal impaction.
5. Left nonobstructive nephrolithiasis. No hydronephrosis or
hydroureter.
